# Patient Record
Sex: Female | Born: 1970 | Race: Black or African American | Hispanic: No | Marital: Married | Smoking: Never smoker
Health system: Southern US, Community
[De-identification: ages and names within clinical notes are randomized; demographics above are authoritative.]

## PROBLEM LIST (undated history)

## (undated) DIAGNOSIS — IMO0002 Reserved for concepts with insufficient information to code with codable children: Secondary | ICD-10-CM

## (undated) DIAGNOSIS — D649 Anemia, unspecified: Secondary | ICD-10-CM

## (undated) DIAGNOSIS — F419 Anxiety disorder, unspecified: Secondary | ICD-10-CM

## (undated) DIAGNOSIS — Z5189 Encounter for other specified aftercare: Secondary | ICD-10-CM

## (undated) DIAGNOSIS — I10 Essential (primary) hypertension: Secondary | ICD-10-CM

## (undated) DIAGNOSIS — K219 Gastro-esophageal reflux disease without esophagitis: Secondary | ICD-10-CM

## (undated) DIAGNOSIS — R51 Headache: Secondary | ICD-10-CM

## (undated) DIAGNOSIS — C801 Malignant (primary) neoplasm, unspecified: Secondary | ICD-10-CM

## (undated) HISTORY — PX: APPENDECTOMY: SHX54

---

## 1998-09-26 HISTORY — PX: BACK SURGERY: SHX140

## 2013-10-27 DIAGNOSIS — C801 Malignant (primary) neoplasm, unspecified: Secondary | ICD-10-CM

## 2013-10-27 HISTORY — DX: Malignant (primary) neoplasm, unspecified: C80.1

## 2013-12-31 ENCOUNTER — Ambulatory Visit (HOSPITAL_COMMUNITY): Payer: Self-pay

## 2014-01-07 ENCOUNTER — Encounter (HOSPITAL_COMMUNITY): Payer: Self-pay | Admitting: *Deleted

## 2014-01-07 ENCOUNTER — Encounter (HOSPITAL_COMMUNITY): Payer: Self-pay

## 2014-01-07 ENCOUNTER — Ambulatory Visit (HOSPITAL_COMMUNITY)
Admission: RE | Admit: 2014-01-07 | Discharge: 2014-01-07 | Disposition: A | Discharge status: Home / Self Care | Payer: Medicaid Other | Source: Ambulatory Visit | Attending: Obstetrics and Gynecology | Admitting: Obstetrics and Gynecology

## 2014-01-07 ENCOUNTER — Encounter (INDEPENDENT_AMBULATORY_CARE_PROVIDER_SITE_OTHER): Payer: Self-pay

## 2014-01-07 VITALS — BP 142/100 | Temp 98.1°F | Ht 70.0 in | Wt 183.2 lb

## 2014-01-07 DIAGNOSIS — N6310 Unspecified lump in the right breast, unspecified quadrant: Secondary | ICD-10-CM | POA: Insufficient documentation

## 2014-01-07 DIAGNOSIS — Z01419 Encounter for gynecological examination (general) (routine) without abnormal findings: Secondary | ICD-10-CM

## 2014-01-07 HISTORY — DX: Anemia, unspecified: D64.9

## 2014-01-07 HISTORY — DX: Essential (primary) hypertension: I10

## 2014-01-07 HISTORY — DX: Encounter for other specified aftercare: Z51.89

## 2014-01-07 NOTE — Patient Instructions (Addendum)
Taught [name] how to perform BSE and gave educational materials to take home. Let her know [facility] will cover Pap smears every 3 years unless has a history of abnormal Pap smears. Referred patient to the [facility] for right breast biopsy per recommendation. Unable to schedule appointment due to the Breast Center needs patient's films from [facility]. Recommended to patient to go over to the Breast Center to sign the consent to get her records released. Let her know that they can't schedule her breast biopsy until they receive her films.  Rhonda [name] verbalized understanding.  [name], RN 9:48 AM

## 2014-01-07 NOTE — Progress Notes (Signed)
Patient referred to [facility] from [facility] [place] due to a biopsy of the right breast being recommended. Patient complained that she has had a right breast lump x 1 year that has increased in size. Patient stated the lump is painful at times. Patient rated pain at a 5-6 out of 10.  Pap Smear:  Pap smear completed today. Patients last Pap smear was in [place] and negative. Patient thinks she had one last month but unsure. Per patient has no history of an abnormal Pap smear. No Pap smear results in EPIC.  Physical exam: Breasts Breasts symmetrical. No skin abnormalities left breast. Right breast has an orange peel appearance under nipple. No nipple retraction bilateral breasts. No nipple discharge bilateral breasts. No lymphadenopathy. No lumps palpated bilateral left breast. Palpated a large irregular mass within the right outer and center breast. Patient complained of some tenderness when palpated mass. Referred patient to the [facility] for right breast biopsy per recommendation. Unable to schedule appointment due to the Breast Center needs patient's films from [facility]          Pelvic/Bimanual   Ext Genitalia No lesions, no swelling and no discharge observed on external genitalia.         Vagina Vagina pink and normal texture. No lesions or discharge observed in vagina.          Cervix Cervix is present. Cervix pink and of normal texture. No discharge observed.     Uterus Uterus is present and palpable. Uterus in normal position and normal size.        Adnexae Bilateral ovaries present and palpable. No tenderness on palpation.          Rectovaginal No rectal exam completed today since patient had no rectal complaints. No skin abnormalities observed on exam.

## 2014-01-10 ENCOUNTER — Other Ambulatory Visit: Payer: Self-pay | Admitting: Obstetrics and Gynecology

## 2014-01-13 ENCOUNTER — Other Ambulatory Visit: Payer: Self-pay | Admitting: Obstetrics and Gynecology

## 2014-01-13 DIAGNOSIS — N63 Unspecified lump in unspecified breast: Secondary | ICD-10-CM

## 2014-01-20 ENCOUNTER — Ambulatory Visit
Admission: RE | Admit: 2014-01-20 | Discharge: 2014-01-20 | Disposition: A | Discharge status: Home / Self Care | Payer: No Typology Code available for payment source | Source: Ambulatory Visit | Attending: Obstetrics and Gynecology | Admitting: Obstetrics and Gynecology

## 2014-01-20 ENCOUNTER — Other Ambulatory Visit: Payer: Self-pay | Admitting: Obstetrics and Gynecology

## 2014-01-20 ENCOUNTER — Other Ambulatory Visit: Payer: Self-pay

## 2014-01-20 DIAGNOSIS — N63 Unspecified lump in unspecified breast: Secondary | ICD-10-CM

## 2014-01-21 ENCOUNTER — Other Ambulatory Visit (INDEPENDENT_AMBULATORY_CARE_PROVIDER_SITE_OTHER): Payer: Self-pay | Admitting: General Surgery

## 2014-01-21 DIAGNOSIS — C50911 Malignant neoplasm of unspecified site of right female breast: Secondary | ICD-10-CM

## 2014-01-22 ENCOUNTER — Telehealth (HOSPITAL_COMMUNITY): Payer: Self-pay | Admitting: *Deleted

## 2014-01-22 NOTE — Telephone Encounter (Signed)
Telephoned patient at home # and no answer. 

## 2014-01-23 ENCOUNTER — Telehealth: Payer: Self-pay | Admitting: *Deleted

## 2014-01-23 ENCOUNTER — Telehealth (HOSPITAL_COMMUNITY): Payer: Self-pay | Admitting: *Deleted

## 2014-01-23 DIAGNOSIS — C50411 Malignant neoplasm of upper-outer quadrant of right female breast: Secondary | ICD-10-CM

## 2014-01-23 NOTE — Telephone Encounter (Signed)
Confirmed [facility] for 01/29/14 at 12N .  Instructions and contact information given. 

## 2014-01-23 NOTE — Telephone Encounter (Signed)
Telephoned patient at home #. Discussed doing [facility] Medicaid paperwork. Patient will come on Wed. May 6 to fill out information. Will call if has any questions.

## 2014-01-27 ENCOUNTER — Ambulatory Visit (HOSPITAL_COMMUNITY)
Admission: RE | Admit: 2014-01-27 | Discharge: 2014-01-27 | Disposition: A | Discharge status: Home / Self Care | Payer: Medicaid Other | Source: Ambulatory Visit | Attending: General Surgery | Admitting: General Surgery

## 2014-01-27 DIAGNOSIS — C50911 Malignant neoplasm of unspecified site of right female breast: Secondary | ICD-10-CM

## 2014-01-27 DIAGNOSIS — C50919 Malignant neoplasm of unspecified site of unspecified female breast: Secondary | ICD-10-CM | POA: Diagnosis not present

## 2014-01-27 DIAGNOSIS — C773 Secondary and unspecified malignant neoplasm of axilla and upper limb lymph nodes: Secondary | ICD-10-CM | POA: Insufficient documentation

## 2014-01-27 LAB — POCT I-STAT, CHEM 8
BUN: 21 mg/dL (ref 6–23)
CHLORIDE: 104 meq/L (ref 96–112)
CREATININE: 0.7 mg/dL (ref 0.50–1.10)
Calcium, Ion: 1.22 mmol/L (ref 1.12–1.23)
GLUCOSE: 95 mg/dL (ref 70–99)
HCT: 43 % (ref 36.0–46.0)
HEMOGLOBIN: 14.6 g/dL (ref 12.0–15.0)
POTASSIUM: 3.8 meq/L (ref 3.7–5.3)
SODIUM: 144 meq/L (ref 137–147)
TCO2: 27 mmol/L (ref 0–100)

## 2014-01-27 MED ORDER — GADOBENATE DIMEGLUMINE 529 MG/ML IV SOLN
17.0000 mL | Freq: Once | INTRAVENOUS | Status: AC | PRN
  Administered 2014-01-27: 17 mL via INTRAVENOUS

## 2014-01-29 ENCOUNTER — Ambulatory Visit
Admission: RE | Admit: 2014-01-29 | Discharge: 2014-01-29 | Disposition: A | Discharge status: Home / Self Care | Payer: Medicaid Other | Source: Ambulatory Visit | Attending: Radiation Oncology | Admitting: Radiation Oncology

## 2014-01-29 ENCOUNTER — Ambulatory Visit: Payer: Medicaid Other

## 2014-01-29 ENCOUNTER — Ambulatory Visit (HOSPITAL_BASED_OUTPATIENT_CLINIC_OR_DEPARTMENT_OTHER): Payer: Medicaid Other | Admitting: General Surgery

## 2014-01-29 ENCOUNTER — Encounter: Payer: Self-pay | Admitting: Specialist

## 2014-01-29 ENCOUNTER — Ambulatory Visit (HOSPITAL_BASED_OUTPATIENT_CLINIC_OR_DEPARTMENT_OTHER): Payer: Medicaid Other | Admitting: Oncology

## 2014-01-29 ENCOUNTER — Telehealth: Payer: Self-pay | Admitting: Oncology

## 2014-01-29 ENCOUNTER — Ambulatory Visit: Payer: Medicaid Other | Attending: General Surgery | Admitting: Physical Therapy

## 2014-01-29 ENCOUNTER — Other Ambulatory Visit (HOSPITAL_BASED_OUTPATIENT_CLINIC_OR_DEPARTMENT_OTHER): Payer: Medicaid Other

## 2014-01-29 ENCOUNTER — Encounter: Payer: Self-pay | Admitting: Oncology

## 2014-01-29 VITALS — BP 135/81 | HR 69 | Temp 98.1°F | Resp 18 | Ht 70.0 in | Wt 186.1 lb

## 2014-01-29 DIAGNOSIS — Z9889 Other specified postprocedural states: Secondary | ICD-10-CM | POA: Insufficient documentation

## 2014-01-29 DIAGNOSIS — C50419 Malignant neoplasm of upper-outer quadrant of unspecified female breast: Secondary | ICD-10-CM

## 2014-01-29 DIAGNOSIS — C50919 Malignant neoplasm of unspecified site of unspecified female breast: Secondary | ICD-10-CM | POA: Diagnosis not present

## 2014-01-29 DIAGNOSIS — I10 Essential (primary) hypertension: Secondary | ICD-10-CM | POA: Insufficient documentation

## 2014-01-29 DIAGNOSIS — C773 Secondary and unspecified malignant neoplasm of axilla and upper limb lymph nodes: Secondary | ICD-10-CM

## 2014-01-29 DIAGNOSIS — C50411 Malignant neoplasm of upper-outer quadrant of right female breast: Secondary | ICD-10-CM

## 2014-01-29 DIAGNOSIS — Z170 Estrogen receptor positive status [ER+]: Secondary | ICD-10-CM

## 2014-01-29 DIAGNOSIS — IMO0001 Reserved for inherently not codable concepts without codable children: Secondary | ICD-10-CM | POA: Insufficient documentation

## 2014-01-29 LAB — CBC WITH DIFFERENTIAL/PLATELET
BASO%: 0.6 % (ref 0.0–2.0)
BASOS ABS: 0 10*3/uL (ref 0.0–0.1)
EOS ABS: 0 10*3/uL (ref 0.0–0.5)
EOS%: 0.8 % (ref 0.0–7.0)
HEMATOCRIT: 39 % (ref 34.8–46.6)
HEMOGLOBIN: 12.1 g/dL (ref 11.6–15.9)
LYMPH%: 44.9 % (ref 14.0–49.7)
MCH: 21.8 pg — AB (ref 25.1–34.0)
MCHC: 30.9 g/dL — ABNORMAL LOW (ref 31.5–36.0)
MCV: 70.7 fL — ABNORMAL LOW (ref 79.5–101.0)
MONO#: 0.6 10*3/uL (ref 0.1–0.9)
MONO%: 12.2 % (ref 0.0–14.0)
NEUT%: 41.5 % (ref 38.4–76.8)
NEUTROS ABS: 2.1 10*3/uL (ref 1.5–6.5)
Platelets: 206 10*3/uL (ref 145–400)
RBC: 5.52 10*6/uL — ABNORMAL HIGH (ref 3.70–5.45)
RDW: 15.7 % — AB (ref 11.2–14.5)
WBC: 5 10*3/uL (ref 3.9–10.3)
lymph#: 2.3 10*3/uL (ref 0.9–3.3)

## 2014-01-29 LAB — COMPREHENSIVE METABOLIC PANEL (CC13)
ALT: 10 U/L (ref 0–55)
ANION GAP: 6 meq/L (ref 3–11)
AST: 19 U/L (ref 5–34)
Albumin: 3.5 g/dL (ref 3.5–5.0)
Alkaline Phosphatase: 78 U/L (ref 40–150)
BUN: 16 mg/dL (ref 7.0–26.0)
CALCIUM: 9.9 mg/dL (ref 8.4–10.4)
CO2: 28 mEq/L (ref 22–29)
CREATININE: 0.8 mg/dL (ref 0.6–1.1)
Chloride: 111 mEq/L — ABNORMAL HIGH (ref 98–109)
GLUCOSE: 105 mg/dL (ref 70–140)
POTASSIUM: 4.8 meq/L (ref 3.5–5.1)
Sodium: 145 mEq/L (ref 136–145)
Total Bilirubin: 0.59 mg/dL (ref 0.20–1.20)
Total Protein: 8 g/dL (ref 6.4–8.3)
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
## 2014-01-29 NOTE — Progress Notes (Signed)
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 female who is seen today through the courtesy of Dr. Excell Seltzer at the [facility] for evaluation of her T3 N1 invasive ductal/DCIS of the right breast. She states that she first noted a right breast mass approximately 2 months ago. Mammography at the [facility] on 12/17/2013 showed a suspicious complex cystic mass within the right breast in addition to small suspicious lesions within the upper-outer quadrant. There was also linear microcalcifications suspicious for DCIS extending over 9 cm. Biopsy was recommended, and she visited the [facility]. On repeat imaging/ultrasound she was noted to have a cystic and solid mass at 9:00, 2 cm from the nipple measuring 2.3 x 1.5 x 2.3 cm. There were also masses at 8:30 and 10:00 measuring up 1.1 cm, 0.6 cm, and 1.0 cm. A lymph node was seen with a diffusely thickened cortex within the right axilla measuring 2.8 x 2.6 x 1.2 cm. Biopsies on 01/20/2014 of masses at 9:00 and 10:00 and axilla were diagnostic for invasive ductal carcinoma and also DCIS. Her tumor was ER/PR negative and HER-2/neu positive with a proliferation Ki-67 of 85%. Breast MR on 01/27/2014 showed extensive malignancy within the central, outer, and upper outer right breast measuring up to 9.4 cm. There was also associated right axillary, anterior pectoral and subpectoral lymphadenopathy. Left breast is unremarkable although she did have a circumscribed mass within the upper outer quadrant felt to represent a benign appearing lymph node measuring 0.5 x 0.6 x 0.6 cm. She is without complaints  today. She seen today with Dr. Excell Seltzer and Dr. Jana Hakim. She is without complaints today.  PREVIOUS RADIATION THERAPY: No   PAST MEDICAL HISTORY:  has a past medical history of Hypertension; Blood transfusion without reported diagnosis; and Anemia.     PAST SURGICAL HISTORY:  Past Surgical History  Procedure Laterality Date  . Cesarean section      2 previous  . Back surgery  2000  . Appendectomy       FAMILY HISTORY: family history includes Hypertension in her father and mother. No family history of breast cancer. Her mother and father are alive and well in their 68s.   SOCIAL HISTORY:  reports that she has never smoked. She has never used smokeless tobacco. She reports that she does not drink alcohol or use illicit drugs. Married, 3 children ages 29, 6, and 63. She is currently taking on line classes studying business/economics. She immigrated from the [place] approximately 10 years ago. Her husband is a Pharmacist, hospital.   ALLERGIES: Review of patient's allergies indicates no known allergies.   MEDICATIONS:  Current Outpatient Prescriptions  Medication Sig Dispense Refill  . acetaminophen (TYLENOL) 325 MG tablet Take 650 mg by mouth every 6 (six) hours as needed.      . baclofen (LIORESAL) 10 MG tablet Take 10 mg by mouth.      . ergocalciferol (VITAMIN D2) 50000 UNITS capsule Take 1 tablet by mouth.      . hydrochlorothiazide (MICROZIDE) 12.5 MG capsule Take 12.5 mg by mouth.      Marland Kitchen  ibuprofen (ADVIL,MOTRIN) 200 MG tablet Take 200 mg by mouth every 6 (six) hours as needed.      . Prenatal Vit-Fe Fumarate-FA (PRENATAL VITAMINS) 28-0.8 MG TABS Take 1 tablet by mouth.       No current facility-administered medications for this encounter.     REVIEW OF SYSTEMS:  Pertinent items are noted in HPI.    PHYSICAL EXAM: Alert and oriented 43 year old African American female appearing her stated age. Wt Readings from Last 3 Encounters:  01/29/14 186 lb 1.6 oz (84.414 kg)  01/07/14  183 lb 3.2 oz (83.099 kg)   Temp Readings from Last 3 Encounters:  01/29/14 98.1 F (36.7 C) Oral  01/07/14 98.1 F (36.7 C) Oral   BP Readings from Last 3 Encounters:  01/29/14 135/81  01/07/14 142/100   Pulse Readings from Last 3 Encounters:  01/29/14 69   Head and neck examination: Grossly unremarkable. Nodes: There is no palpable cervical or supraclavicular lymphadenopathy. I can feel an enlarged lymph node in the right axilla. Chest: Lungs clear. Breasts: There is a conglomeration of masses within the upper-outer quadrant of the right breast extending from the edge of the areola towards the axilla. These masses extending over 6-7 cm. There is no obvious skin involvement. Left breast without masses or lesions. Abdomen: Without hepatomegaly. Extremities: Without edema.    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 Dr. Jana Hakim will schedule completion of her staging workup. She will be referred for genetic counseling. We discussed local regional treatment options. She is a good candidate for neoadjuvant chemotherapy and we can expect a favorable response. I suspect that the 9 cm area of calcifications represent DCIS which would probably preclude breast preservation surgery. If she has an excellent response to neoadjuvant chemotherapy, and if she is highly motivated for breast preservation, we could biopsy the suspicious calcifications to confirm the presence or absence of DCIS following neoadjuvant chemotherapy. At this point in time I would certainly  lean towards modified radical mastectomy. She understands that she would probably need an axillary dissection based on her extensive axillary adenopathy. She will need postoperative radiation therapy to improve local regional control. We discussed the potential acute and late toxicities of radiation therapy. I can see her following her definitive surgery.   PLAN: As discussed above.  I spent 30 minutes minutes face to face with the patient and more than 50% of that time was spent in counseling and/or coordination of care.

## 2014-01-29 NOTE — Telephone Encounter (Signed)
per pof to sch pt appt/sent ECHO to be pre-cert-class sch-printed copy of sch

## 2014-01-29 NOTE — Progress Notes (Signed)
Chief Complaint: New diagnosis of breast cancer  History:    Rhonda [name] is a 43 y.o. premenopausal female referred by Dr. Darrin Nipper  for evaluation of recently diagnosed carcinoma of the right breast. The patient is originally from the [place]. She was in [place] about 2-3 months ago when she was able to feel a mass in the outer right breast as well as under her right arm. She was evaluated there but is a [place] citizen and wanted to return to the [place] for treatment. She subsequently returned to this country and saw her primary physician Dr. Marcell Anger and was referred to the breast center for further evaluation.  Subsequent imaging included diagnostic mamogram showing pleomorphic calcifications in the upper-outer right breast spanning an area of about 9 cm. 4 distinct well-circumscribed masses were seen in the upper outer and lower outer right breast measuring approximately 1.2 cm each and ultrasound showing multiple suspicious findings this included a 2 cm complex cystic mass in the superior central right breast, a 4 mm hypoechoic lesion in the upper-outer right breast 4 cm from the nipple, and 3 other suspicious solid masses all in the upper outer quadrant..   A ultrasound guided biopsy was performed on 01/21/2014 of 2 of the masses measuring 2.3 and 1.6 cm and a right axillary lymph node. 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 Subsequent bilateral breast MRI was performed which was negative in the left breast. On the right there were noted to be several enlarged axillary, interpectoral and subpectoral lymph nodes measuring up to 2.7 cm. There was extensive malignancy involving the central outer and upper right breast measuring up to 9.4 cm. She is seen now in multidisciplinary breast clinic for initial treatment planning.  She has experienced some mild breast fullness and discomfort. No nipple discharge..  She does have a  personal history of right breast abscess a number of years ago after birth of her first child.  Findings at that time were the following:  Tumor size: total of 9 cm  Tumor grade: 2  Estrogen Receptor: negative Progesterone Receptor: negative  Her-2 neu: positive  Lymph node status: positive   Past Medical History  Diagnosis Date  . Hypertension   . Blood transfusion without reported diagnosis     3 previous   . Anemia     Past Surgical History  Procedure Laterality Date  . Cesarean section      2 previous  . Back surgery  2000  . Appendectomy      Current Outpatient Prescriptions  Medication Sig Dispense Refill  . acetaminophen (TYLENOL) 325 MG tablet Take 650 mg by mouth every 6 (six) hours as needed.      . baclofen (LIORESAL) 10 MG tablet Take 10 mg by mouth.      . ergocalciferol (VITAMIN D2) 50000 UNITS capsule Take 1 tablet by mouth.      . hydrochlorothiazide (MICROZIDE) 12.5 MG capsule Take 12.5 mg by mouth.      Marland Kitchen ibuprofen (ADVIL,MOTRIN) 200 MG tablet Take 200 mg by mouth every 6 (six) hours as needed.      . Prenatal Vit-Fe Fumarate-FA (PRENATAL VITAMINS) 28-0.8 MG TABS Take 1 tablet by mouth.       No current facility-administered medications for this visit.    Family History  Problem Relation Age of Onset  . Hypertension Mother   . Hypertension Father     History   Social History  . Marital Status:  Married    Spouse Name: N/A    Number of Children: N/A  . Years of Education: N/A   Social History Main Topics  . Smoking status: Never Smoker   . Smokeless tobacco: Never Used  . Alcohol Use: No  . Drug Use: No  . Sexual Activity: Yes    Birth Control/ Protection: None   Other Topics Concern  . Not on file   Social History Narrative  . No narrative on file     Review of Systems A comprehensive review of systems was negative.     Objective:  LMP 11/02/2013  General: Alert, well-developed African female, in no distress Skin: Warm and  dry without rash or infection. HEENT: No palpable masses or thyromegaly. Sclera nonicteric. Pupils equal round and reactive. Oropharynx clear. Breasts: the right breast is mildly swollen and enlarged with mild edema around the nipple. There is a large firm irregular mass or multiple masses palpable in the upper outer and lateral breast. No fixation. There is palpable none fixed left axillary adenopathy. Right breast is negative to exam. Lymph nodes: No cervical, supraclavicular, or inguinal nodes palpable. Lungs: Breath sounds clear and equal without increased work of breathing Cardiovascular: Regular rate and rhythm without murmur. No JVD or edema. Peripheral pulses intact. Abdomen: Nondistended. Soft and nontender. No masses palpable. No organomegaly. No palpable hernias. Extremities: No edema or joint swelling or deformity. No chronic venous stasis changes. Neurologic: Alert and fully oriented. Gait normal.   Laboratory data:  CBC:  Lab Results  Component Value Date   WBC 5.0 01/29/2014   RBC 5.52* 01/29/2014   HGB 12.1 01/29/2014   HGB 14.6 01/27/2014   HCT 39.0 01/29/2014   HCT 43.0 01/27/2014   PLT 206 01/29/2014  ]  [facility] Labs:  Lab Results  Component Value Date   NA 145 01/29/2014   NA 144 01/27/2014   K 4.8 01/29/2014   K 3.8 01/27/2014   CL 104 01/27/2014   CO2 28 01/29/2014   BUN 16.0 01/29/2014   BUN 21 01/27/2014   CREATININE 0.8 01/29/2014   CREATININE 0.70 01/27/2014   CALCIUM 9.9 01/29/2014   PROT 8.0 01/29/2014   BILITOT 0.59 01/29/2014   ALKPHOS 78 01/29/2014   AST 19 01/29/2014   ALT 10 01/29/2014     Assessment  43 y.o. female with a new diagnosis of cancer of the the right breast Central, upper upper outer and lateral.  Clinical IIIA, estrogen receptor negative, progesterone receptor negative and Her2/Neu protein/oncogene positive. I discussed with the patient  initial surgical treatment options. She clearly would be best served with neoadjuvant chemotherapy to get her systemic treatment started  and possibly down stage for local disease. We discussed options of breast conservation with lumpectomy or total mastectomy and the ultimate need for axillary dissection. I think it is likely she will require mastectomy particularly with the degree of calcifications representing DCIS but we discussed that for now we did not have to make this decision and there is the possibility that she could be down staged to the point of being a candidate for breast conservation.Options for reconstruction were discussed. We discussed Port-A-Cath placement including its indication in nature the surgery as well as risks of anesthetic complications, bleeding, infection, pneumothorax and subsequent risks of catheter malfunction or displacement or occlusion and DVT. She will need genetic testing. Metastatic workup is planned.we will plan to proceed with Port-A-Cath placement and subsequent followup during her neoadjuvant treatment. Plan Port-A-Cath placement as an  outpatient to begin neoadjuvant treatment. Metastatic workup and genetic counseling.  Edward Jolly MD, FACS  01/29/2014, 3:05 PM

## 2014-01-29 NOTE — Progress Notes (Signed)
[place]  [phone] [phone]     ID: Rhonda [name] OB: [birth year]    MR#: [id number]  EHM#:[id number]  PCP: Tereasa Coop, PA-C GYN:   SU: Excell Seltzer OTHER MD: Arloa Koh  CHIEF COMPLAINT: "I have breast cancer".  BREAST CANCER HISTORY: Rhonda [name] was visiting relatives in [place] about 4 months ago when she noted pain in her right breast. In 2003 she had a right breast infection which felt pretty much the same. She was treated with antibiotics and then (while living in [place]). Accordingly this time she minimized the symptoms. It felt like milk was coming down she says. She also had breast pain while having her period. Then in February she actually found 2 lumps in her right breast breast which she initially ignored. Eventually as the symptoms seemed to progress she saw a Dr. [place] and he told her she likely had breast cancer. She [place] until [place] to seek definitive care.  On 12/17/2013 she had bilateral diagnostic mammography in [facility]). In the upper outer right breast there were pleomorphic calcifications measuring approximately 9 cm in diameter. There were 4 distinct microlobulated masses in the right breast, 3 of them in the upper outer quadrant one in the lower outer quadrant. Each measures approximately 1.2 cm. Ultrasound showed a complex cystic mass in the superior central right breast, a 4 mm hypoechoic lesion, 3 suspicious solid masses elsewhere in the right breast, all of them hypoechoic and oval are round with increased vascularity. The largest of these masses measure 1.7 cm. Findings in the left breast were not suspicious or specific. The patient was referred to the breast Center where on 01/20/2014 she underwent biopsy of 2 of the right breast masses, and a right axillary lymph node. 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  On may 01/13/2014 the patient underwent bilateral breast MRI. This showed, in the right breast, multiple irregular enhancing masses in the setting of clumped non-masslike enhancement the area in question measured up to 9.4 cm. There was diffuse asymmetric skin thickening and edema involving the right breast, but no obvious nipple or pectoralis involvement. There were several enlarged and morphologically abnormal right axillary lymph node as well as inter-pectoral and subpectoral adenopathy there the largest right axillary lymph node measured 2.7 cm. There was no internal mammary lymphadenopathy noted. The left breast was unremarkable.  Her subsequent history is as detailed below.  INTERVAL HISTORY: Rhonda [name] was evaluated in the multidisciplinary breast cancer clinic 01/29/2014. Her case was presented at the multidisciplinary breast cancer conference that same morning.  REVIEW OF SYSTEMS: Aside from the right breast symptoms noted, there were no systemic symptoms leading to the diagnostic mammogram,. The patient denies unusual headaches, visual changes, nausea, vomiting, stiff neck, dizziness, or gait imbalance. There has been no cough, phlegm production, or pleurisy, no chest pain or pressure, and no change in bowel or bladder habits. The patient denies fever, rash, bleeding, unexplained fatigue or unexplained weight loss. She tells me her appetite has been a little down, and sometimes she has pain with urination. She has rare headaches, which are not more frequent or intense than prior. A detailed review of systems was otherwise entirely negative.  PAST MEDICAL HISTORY: Past Medical History  Diagnosis Date  . Hypertension   . Blood transfusion without reported diagnosis  3 previous   . Anemia     PAST SURGICAL HISTORY: Past Surgical History  Procedure Laterality Date  . Cesarean section      2  previous  . Back surgery  2000  . Appendectomy      FAMILY HISTORY Family History  Problem Relation Age of Onset  . Hypertension Mother   . Hypertension Father    the patient's parents are living but she is not sure of their age. She had 4 brothers and 4 sisters. 2 other brothers died in the turmoil of [place], and one brother is currently in [place] but they do not know where are even if he is still alive. She has one brother in this area. There is no history of breast or ovarian cancer in the family to her knowledge.  GYNECOLOGIC HISTORY:  Menarche somewhere between the ages of 89 and 72. First live birth age 68. The patient is GX P3. Her periods are now irregular. She took birth control pills remotely for approximately one year with no complaints.  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 Her husband Rhonda [name] Rhonda [name] is a former and basilar to the [place] from some and is a Optometrist. Their children are Duopl (11),Nyamal (8) and Nyewech (4). The patient attends a [employer] locally    ADVANCED DIRECTIVES: Not in place   HEALTH MAINTENANCE: History  Substance Use Topics  . Smoking status: Never Smoker   . Smokeless tobacco: Never Used  . Alcohol Use: No     Colonoscopy:  PAP: April 2015  Bone density:  Lipid panel:  No Known Allergies  Current Outpatient Prescriptions  Medication Sig Dispense Refill  . acetaminophen (TYLENOL) 325 MG tablet Take 650 mg by mouth every 6 (six) hours as needed.      . baclofen (LIORESAL) 10 MG tablet Take 10 mg by mouth.      . ergocalciferol (VITAMIN D2) 50000 UNITS capsule Take 1 tablet by mouth.      . hydrochlorothiazide (MICROZIDE) 12.5 MG capsule Take 12.5 mg by mouth.      Marland Kitchen ibuprofen (ADVIL,MOTRIN) 200 MG tablet Take 200 mg by mouth every 6 (six) hours as needed.      . Prenatal Vit-Fe Fumarate-FA (PRENATAL VITAMINS) 28-0.8 MG TABS Take 1 tablet by mouth.       No current facility-administered medications  for this visit.    OBJECTIVE: African woman who appears well Filed Vitals:   01/29/14 1241  BP: 135/81  Pulse: 69  Temp: 98.1 F (36.7 C)  Resp: 18     Body mass index is 26.7 kg/(m^2).    ECOG FS:1 - Symptomatic but completely ambulatory  Ocular: Sclerae unicteric, pupils equal, round and reactive to light Ear-nose-throat: Oropharynx clear and moist Lymphatic: No cervical or supraclavicular adenopathy Lungs no rales or rhonchi, good excursion bilaterally Heart regular rate and rhythm, no murmur appreciated Abd soft, nontender, positive bowel sounds MSK no focal spinal tenderness, no joint edema Neuro: non-focal, well-oriented, appropriate affect Breasts: The right breast shows what appears to be a 5 cm mass in the upper outer quadrant. This is movable. There is no obvious erythema or evidence of inflammatory breast cancer. The right axilla shows palpable adenopathy. It is not fixed. The left breast is unremarkable.   LAB RESULTS:  CMP     Component Value Date/Time   NA 145 01/29/2014 1231   NA 144 01/27/2014 1630   K 4.8 01/29/2014 1231  K 3.8 01/27/2014 1630   CL 104 01/27/2014 1630   CO2 28 01/29/2014 1231   GLUCOSE 105 01/29/2014 1231   GLUCOSE 95 01/27/2014 1630   BUN 16.0 01/29/2014 1231   BUN 21 01/27/2014 1630   CREATININE 0.8 01/29/2014 1231   CREATININE 0.70 01/27/2014 1630   CALCIUM 9.9 01/29/2014 1231   PROT 8.0 01/29/2014 1231   ALBUMIN 3.5 01/29/2014 1231   AST 19 01/29/2014 1231   ALT 10 01/29/2014 1231   ALKPHOS 78 01/29/2014 1231   BILITOT 0.59 01/29/2014 1231    I No results found for this basename: SPEP, UPEP,  kappa and lambda light chains    Lab Results  Component Value Date   WBC 5.0 01/29/2014   NEUTROABS 2.1 01/29/2014   HGB 12.1 01/29/2014   HCT 39.0 01/29/2014   MCV 70.7* 01/29/2014   PLT 206 01/29/2014      Chemistry      Component Value Date/Time   NA 145 01/29/2014 1231   NA 144 01/27/2014 1630   K 4.8 01/29/2014 1231   K 3.8 01/27/2014 1630   CL 104 01/27/2014 1630   CO2 28  01/29/2014 1231   BUN 16.0 01/29/2014 1231   BUN 21 01/27/2014 1630   CREATININE 0.8 01/29/2014 1231   CREATININE 0.70 01/27/2014 1630      Component Value Date/Time   CALCIUM 9.9 01/29/2014 1231   ALKPHOS 78 01/29/2014 1231   AST 19 01/29/2014 1231   ALT 10 01/29/2014 1231   BILITOT 0.59 01/29/2014 1231       No results found for this basename: LABCA2    No components found with this basename: LABCA125    No results found for this basename: INR,  in the last 168 hours  Urinalysis No results found for this basename: colorurine, appearanceur, labspec, phurine, glucoseu, hgbur, bilirubinur, ketonesur, proteinur, urobilinogen, nitrite, leukocytesur    STUDIES: Mr Breast Bilateral W Wo Contrast  01/28/2014   CLINICAL DATA:  43 year old female with recently diagnosed 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  LABS:  Most recent serum creatinine 0.7 milligrams/deciliter.  EXAM: BILATERAL BREAST MRI WITH AND WITHOUT CONTRAST  TECHNIQUE: Multiplanar, multisequence MR images of both breasts were obtained prior to and following the intravenous administration of 51m of MultiHance.  THREE-DIMENSIONAL MR IMAGE RENDERING ON INDEPENDENT WORKSTATION:  Three-dimensional MR images were rendered by post-processing of the original MR data on an independent workstation. The three-dimensional MR images were interpreted, and findings are reported in the following complete MRI report for this study. Three dimensional images were evaluated at the independent DynaCad workstation  COMPARISON:  Previous exams  FINDINGS: Breast composition: c:  Heterogeneous fibroglandular tissue  Background parenchymal enhancement: Moderate  Right breast: Multiple irregular enhancing masses with associated contiguous and surrounding areas of clumped non mass enhancement are seen throughout the central, outer, and upper-outer right breast compatible  with known biopsy proven malignancy. This measures up to 9.4 cm in greatest AP dimension, approximately 5 cm transverse, and up to 7 cm craniocaudal. There is diffuse asymmetric skin thickening as well as edema involving the right breast. No obvious nipple involvement. No pectoralis involvement.  Left breast: No suspicious rapidly enhancing masses or abnormal areas of enhancement in the left breast to suggest malignancy. A small oval circumscribed mass in the upper-outer left breast measuring 0.5 x 0.6 x 0.6 cm corresponds with a benign appearing  lymph node seen on the recent outside ultrasound.  Lymph nodes: Several enlarged and morphologically abnormal right axillary, interpectoral, and subpectoral lymph nodes are identified compatible with known biopsy proven axillary metastases, with a representative enlarged right axillary lymph node measuring up to 2.7 cm AP. No internal mammary lymphadenopathy seen.  Ancillary findings:  None.  IMPRESSION: Extensive malignancy involving predominately the central, outer, and upper-outer right breast measuring up to 9.4 cm in greatest AP dimension with associated right axillary (biopsy proven), interpectoral, and subpectoral lymphadenopathy.  RECOMMENDATION: Treatment for known biopsy proven malignancy in the right breast.  BI-RADS CATEGORY  6: Known biopsy-proven malignancy.   Electronically Signed   By: Everlean Alstrom M.D.   On: 01/28/2014 09:25   Mm Digital Diagnostic Unilat R  01/20/2014   CLINICAL DATA:  Right breast masses. Ultrasound guided right breast biopsies.  EXAM: POST-BIOPSY CLIP PLACEMENT RIGHT DIAGNOSTIC MAMMOGRAM  COMPARISON:  Previous exams.  FINDINGS: Films are performed following ultrasound guided biopsy of right breast masses. Images show a ribbon type clip in association with the mass in the 9 o'clock position, 2 cm the nipple and a wing type clip in association with a mass in the 10 o'clock position, 7 cm the nipple. The clips are 5.8 cm apart.   IMPRESSION: Adequate clip placement following ultrasound-guided right breast biopsies.  Final Assessment: Post Procedure Mammograms for Marker Placement   Electronically Signed   By: Duke Salvia M.D.   On: 01/20/2014 16:17   [place] Rt Breast Bx W Loc Dev 1st Lesion Img Bx Spec US Guide  01/21/2014   ADDENDUM REPORT: 01/21/2014 13:37  ADDENDUM: Biopsy results reveal grade 2 ductal carcinoma in situ and invasive ductal carcinoma at site 1, grade 2 invasive ductal carcinoma at site to and metastases in the lymph node in the right axilla. This is concordant with imaging findings. The lower inner and lower outer quadrant of the breast are involved spanning at least 10 cm. This discussed with the patient by telephone at 115 pm and also texted to the patient's cell phone. An MRI is scheduled for 01/27/2014 at 4 p.m. at [facility]. She will be seen at [facility] on 01/29/2014. She states the site is healing well with mild tenderness which is controlled with Tylenol. No significant bruising is present.   Electronically Signed   By: Duke Salvia M.D.   On: 01/21/2014 13:37   01/21/2014   CLINICAL DATA:  Right breast masses, right axillary mass  EXAM: ULTRASOUND GUIDED RIGHT BREAST CORE NEEDLE BIOPSY X 2 AND RIGHT AXILLARY CORE NEEDLE BIOPSY  COMPARISON:  Previous exams.  FINDINGS: A cystic and solid mass is imaged in the 9 o'clock position, 2 cm from the nipple measuring 2.3 x 1.5 x 2.3 cm (SITE 1). A round homogeneously hypoechoic mass with irregular margins is imaged and 8:30 position, 3 cm from nipple measuring 1.0 x 0.8 x 0.9 cm. Three similar appearing masses are identified in the 10 o'clock position, at 4 cm the nipple, 6 cm the nipple and 7 cm from nipple measuring 1.0 x 1.0 x 1.1 cm, 1.0 x 1.6 x 1.1 cm and 0.9 x 1.0 x 0.9 cm, respectively. The area that is biopsied is in the 10 o'clock position, 7 cm from nipple, (SITE 2). A lymph node with diffusely thickened cortex is imaged in the  right axilla measuring 2.8 x 2.6 x 1.2 cm (site 3).  I met with the patient and we discussed the procedure of ultrasound-guided biopsy,  including benefits and alternatives. We discussed the high likelihood of a successful procedure. We discussed the risks of the procedure, including infection, bleeding, tissue injury, clip migration, and inadequate sampling. Informed written consent was given. The usual time-out protocol was performed immediately prior to the procedure.  Using sterile technique and 2% Lidocaine as local anesthetic, under direct ultrasound visualization, approximately 5 cc of blood-tinged fluid is aspirated from the mass in the 9 o'clock position. The fluid is not purulent and therefore, is not sent for cytology. Then, a 14 gauge spring-loaded device was used to perform biopsy of the mass in the 9 o'clock position using a lateral approach. This is SITE 1. At the conclusion of the procedure a ribbon type tissue marker clip was deployed into the biopsy cavity.  Using sterile technique and 2% Lidocaine as local anesthetic, under direct ultrasound visualization, a 14 gauge spring-loaded device was used to perform biopsy of the mass in the 10 o'clock position, 7 cm from the nipple using an inferior approach. This is SITE 2. At the conclusion of the procedure a wing type tissue marker clip was deployed into the biopsy cavity.  Using sterile technique and 2% Lidocaine as local anesthetic, under direct ultrasound visualization, a 14 gauge spring-loaded device was used to perform biopsy of the right axillary lymph node using a lateral approach. No tissue marker is placed in the axilla.  Follow up 2 view mammogram was performed and dictated separately.  IMPRESSION: Ultrasound guided biopsy of 2 right breast masses and a right axillary lymph node. No apparent complications.  Electronically Signed: By: Duke Salvia M.D. On: 01/20/2014 16:15   [place] Rt Breast Bx W Loc Dev Ea Add Lesion Img Bx Spec US  Guide  01/21/2014   ADDENDUM REPORT: 01/21/2014 13:37  ADDENDUM: Biopsy results reveal grade 2 ductal carcinoma in situ and invasive ductal carcinoma at site 1, grade 2 invasive ductal carcinoma at site to and metastases in the lymph node in the right axilla. This is concordant with imaging findings. The lower inner and lower outer quadrant of the breast are involved spanning at least 10 cm. This discussed with the patient by telephone at 115 pm and also texted to the patient's cell phone. An MRI is scheduled for 01/27/2014 at 4 p.m. at [facility]. She will be seen at [facility] on 01/29/2014. She states the site is healing well with mild tenderness which is controlled with Tylenol. No significant bruising is present.   Electronically Signed   By: Duke Salvia M.D.   On: 01/21/2014 13:37   01/21/2014   CLINICAL DATA:  Right breast masses, right axillary mass  EXAM: ULTRASOUND GUIDED RIGHT BREAST CORE NEEDLE BIOPSY X 2 AND RIGHT AXILLARY CORE NEEDLE BIOPSY  COMPARISON:  Previous exams.  FINDINGS: A cystic and solid mass is imaged in the 9 o'clock position, 2 cm from the nipple measuring 2.3 x 1.5 x 2.3 cm (SITE 1). A round homogeneously hypoechoic mass with irregular margins is imaged and 8:30 position, 3 cm from nipple measuring 1.0 x 0.8 x 0.9 cm. Three similar appearing masses are identified in the 10 o'clock position, at 4 cm the nipple, 6 cm the nipple and 7 cm from nipple measuring 1.0 x 1.0 x 1.1 cm, 1.0 x 1.6 x 1.1 cm and 0.9 x 1.0 x 0.9 cm, respectively. The area that is biopsied is in the 10 o'clock position, 7 cm from nipple, (SITE 2). A lymph node with diffusely thickened cortex is imaged  in the right axilla measuring 2.8 x 2.6 x 1.2 cm (site 3).  I met with the patient and we discussed the procedure of ultrasound-guided biopsy, including benefits and alternatives. We discussed the high likelihood of a successful procedure. We discussed the risks of the procedure, including  infection, bleeding, tissue injury, clip migration, and inadequate sampling. Informed written consent was given. The usual time-out protocol was performed immediately prior to the procedure.  Using sterile technique and 2% Lidocaine as local anesthetic, under direct ultrasound visualization, approximately 5 cc of blood-tinged fluid is aspirated from the mass in the 9 o'clock position. The fluid is not purulent and therefore, is not sent for cytology. Then, a 14 gauge spring-loaded device was used to perform biopsy of the mass in the 9 o'clock position using a lateral approach. This is SITE 1. At the conclusion of the procedure a ribbon type tissue marker clip was deployed into the biopsy cavity.  Using sterile technique and 2% Lidocaine as local anesthetic, under direct ultrasound visualization, a 14 gauge spring-loaded device was used to perform biopsy of the mass in the 10 o'clock position, 7 cm from the nipple using an inferior approach. This is SITE 2. At the conclusion of the procedure a wing type tissue marker clip was deployed into the biopsy cavity.  Using sterile technique and 2% Lidocaine as local anesthetic, under direct ultrasound visualization, a 14 gauge spring-loaded device was used to perform biopsy of the right axillary lymph node using a lateral approach. No tissue marker is placed in the axilla.  Follow up 2 view mammogram was performed and dictated separately.  IMPRESSION: Ultrasound guided biopsy of 2 right breast masses and a right axillary lymph node. No apparent complications.  Electronically Signed: By: Duke Salvia M.D. On: 01/20/2014 16:15   [place] Rt Breast Bx W Loc Dev Ea Add Lesion Img Bx Spec US Guide  01/21/2014   ADDENDUM REPORT: 01/21/2014 13:37  ADDENDUM: Biopsy results reveal grade 2 ductal carcinoma in situ and invasive ductal carcinoma at site 1, grade 2 invasive ductal carcinoma at site to and metastases in the lymph node in the right axilla. This is concordant with imaging  findings. The lower inner and lower outer quadrant of the breast are involved spanning at least 10 cm. This discussed with the patient by telephone at 115 pm and also texted to the patient's cell phone. An MRI is scheduled for 01/27/2014 at 4 p.m. at [facility]. She will be seen at [facility] on 01/29/2014. She states the site is healing well with mild tenderness which is controlled with Tylenol. No significant bruising is present.   Electronically Signed   By: Duke Salvia M.D.   On: 01/21/2014 13:37   01/21/2014   CLINICAL DATA:  Right breast masses, right axillary mass  EXAM: ULTRASOUND GUIDED RIGHT BREAST CORE NEEDLE BIOPSY X 2 AND RIGHT AXILLARY CORE NEEDLE BIOPSY  COMPARISON:  Previous exams.  FINDINGS: A cystic and solid mass is imaged in the 9 o'clock position, 2 cm from the nipple measuring 2.3 x 1.5 x 2.3 cm (SITE 1). A round homogeneously hypoechoic mass with irregular margins is imaged and 8:30 position, 3 cm from nipple measuring 1.0 x 0.8 x 0.9 cm. Three similar appearing masses are identified in the 10 o'clock position, at 4 cm the nipple, 6 cm the nipple and 7 cm from nipple measuring 1.0 x 1.0 x 1.1 cm, 1.0 x 1.6 x 1.1 cm and 0.9 x 1.0 x 0.9  cm, respectively. The area that is biopsied is in the 10 o'clock position, 7 cm from nipple, (SITE 2). A lymph node with diffusely thickened cortex is imaged in the right axilla measuring 2.8 x 2.6 x 1.2 cm (site 3).  I met with the patient and we discussed the procedure of ultrasound-guided biopsy, including benefits and alternatives. We discussed the high likelihood of a successful procedure. We discussed the risks of the procedure, including infection, bleeding, tissue injury, clip migration, and inadequate sampling. Informed written consent was given. The usual time-out protocol was performed immediately prior to the procedure.  Using sterile technique and 2% Lidocaine as local anesthetic, under direct ultrasound visualization,  approximately 5 cc of blood-tinged fluid is aspirated from the mass in the 9 o'clock position. The fluid is not purulent and therefore, is not sent for cytology. Then, a 14 gauge spring-loaded device was used to perform biopsy of the mass in the 9 o'clock position using a lateral approach. This is SITE 1. At the conclusion of the procedure a ribbon type tissue marker clip was deployed into the biopsy cavity.  Using sterile technique and 2% Lidocaine as local anesthetic, under direct ultrasound visualization, a 14 gauge spring-loaded device was used to perform biopsy of the mass in the 10 o'clock position, 7 cm from the nipple using an inferior approach. This is SITE 2. At the conclusion of the procedure a wing type tissue marker clip was deployed into the biopsy cavity.  Using sterile technique and 2% Lidocaine as local anesthetic, under direct ultrasound visualization, a 14 gauge spring-loaded device was used to perform biopsy of the right axillary lymph node using a lateral approach. No tissue marker is placed in the axilla.  Follow up 2 view mammogram was performed and dictated separately.  IMPRESSION: Ultrasound guided biopsy of 2 right breast masses and a right axillary lymph node. No apparent complications.  Electronically Signed: By: Duke Salvia M.D. On: 01/20/2014 16:15    ASSESSMENT: 43 y.o. [place] woman originally from [place] status post right breast and right axillary lymph node biopsy 01/20/2014, both positive for a clinically T3 N1-2, stage III a invasive ductal carcinoma, grade 2, estrogen and progesterone receptor positive, HER-2 amplified, with an MIB-1 of 85%  PLAN: We spent the better part of today's hour-long appointment discussing the biology of breast cancer in general, and the specifics of the patient's tumor in particular. Siddalee understands she has a fast growing, stage III breast cancer which "does not eat estrogen". This means in terms of systemic treatment  anti-estrogens would not be useful. Her options for systemic treatment our chemotherapy and anti-HER-2 immunotherapy.  Her case was discussed this morning at the multidisciplinary breast cancer conference, and the consensus was that [place] will benefit from neoadjuvant chemotherapy in conjunction with anti-HER-2 treatment. She can then proceed to surgery, which will be a modified radical mastectomy on the right as currently planned. She would need radiation and postmastectomy.  We did discuss the chemotherapy options and she will receive doxorubicin and cyclophosphamide and dose dense fashion x4, with Neulasta support, followed by paclitaxel weekly given with pertuzumab and trastuzumab. That will be continued for 12 weeks. The total chemotherapy accordingly we'll take 20 weeks. Of course the anti-HER-2 treatment will be continued to the total one year.  We discussed some of the possible toxicities, side effects and complications of treatment and she will come to "chemotherapy school" to discuss that further. In particular, the patient would be interested in having  more children, but understands she may become permanently menopausal as a result of chemotherapy.   She will have a port placed, and echocardiogram, and she will have staging studies to document that she does not currently have stage IV disease. She will return to see me 02/05/2014 and we will try to get her chemotherapy started 02/11/2014. At the next visit we will discuss her possible participation in the [place] has a good understanding of the overall plan. She agrees with it. She knows the goal of treatment in her case is cure. She will call with any problems that may develop before her next visit here   Chauncey Cruel, MD   01/29/2014 2:11 PM

## 2014-01-30 ENCOUNTER — Other Ambulatory Visit: Payer: Medicaid Other

## 2014-01-30 ENCOUNTER — Encounter: Payer: Self-pay | Admitting: *Deleted

## 2014-02-01 ENCOUNTER — Telehealth: Payer: Self-pay | Admitting: Oncology

## 2014-02-01 NOTE — Telephone Encounter (Signed)
Sent a staff message to michelle to add the pt in for her chemo appts gve copy of the echo referral to [facility] for precert. Will call the pt.

## 2014-02-03 ENCOUNTER — Telehealth: Payer: Self-pay | Admitting: Oncology

## 2014-02-03 ENCOUNTER — Telehealth: Payer: Self-pay | Admitting: *Deleted

## 2014-02-03 NOTE — Telephone Encounter (Signed)
appt w/AB per 5/6 pof not scheduled due to pt already on schedule for f/u w/AB 5/13 and 5/12 @ 3:45pm in not an active appt slot. s.w navaigator (dawn) and per navigator pt aware of 5/13 appt.

## 2014-02-03 NOTE — Telephone Encounter (Signed)
Per staff message and POF I have scheduled appts.  JMW  

## 2014-02-04 ENCOUNTER — Other Ambulatory Visit: Payer: Self-pay

## 2014-02-04 ENCOUNTER — Encounter (HOSPITAL_COMMUNITY)
Admission: RE | Admit: 2014-02-04 | Discharge: 2014-02-04 | Disposition: A | Discharge status: Home / Self Care | Payer: Medicaid Other | Source: Ambulatory Visit | Attending: General Surgery | Admitting: General Surgery

## 2014-02-04 ENCOUNTER — Telehealth: Payer: Self-pay | Admitting: Oncology

## 2014-02-04 ENCOUNTER — Encounter (HOSPITAL_COMMUNITY): Payer: Self-pay

## 2014-02-04 ENCOUNTER — Other Ambulatory Visit: Payer: Self-pay | Admitting: Physician Assistant

## 2014-02-04 ENCOUNTER — Encounter (HOSPITAL_COMMUNITY): Payer: Self-pay | Admitting: Pharmacy Technician

## 2014-02-04 ENCOUNTER — Ambulatory Visit (HOSPITAL_COMMUNITY)
Admission: RE | Admit: 2014-02-04 | Discharge: 2014-02-04 | Disposition: A | Discharge status: Home / Self Care | Payer: Medicaid Other | Source: Ambulatory Visit | Attending: Anesthesiology | Admitting: Anesthesiology

## 2014-02-04 ENCOUNTER — Other Ambulatory Visit: Payer: Medicaid Other

## 2014-02-04 DIAGNOSIS — C50919 Malignant neoplasm of unspecified site of unspecified female breast: Secondary | ICD-10-CM | POA: Insufficient documentation

## 2014-02-04 HISTORY — DX: Reserved for concepts with insufficient information to code with codable children: IMO0002

## 2014-02-04 HISTORY — DX: Headache: R51

## 2014-02-04 HISTORY — DX: Malignant (primary) neoplasm, unspecified: C80.1

## 2014-02-04 HISTORY — DX: Gastro-esophageal reflux disease without esophagitis: K21.9

## 2014-02-04 LAB — HCG, SERUM, QUALITATIVE: PREG SERUM: NEGATIVE

## 2014-02-04 NOTE — Telephone Encounter (Signed)
cld pt to make aware of the Echo that was sch in am.Reminded pt of the appt w/Berry in the am. Adv pt that i would print an updated sch for her after the appt w/AB

## 2014-02-04 NOTE — Progress Notes (Signed)
CMET 01/29/14 on EPIC, CBC with diff 01/29/14 on EPIC

## 2014-02-04 NOTE — Patient Instructions (Addendum)
[place]  02/04/2014   Your procedure is scheduled on: 02/06/14  Report to [place] at 7:30 AM.  Call this number if you have problems the morning of surgery [phone]: [phone]   Remember:   Do not eat food or drink liquids After Midnight.     Take these medicines the morning of surgery with A SIP OF WATER: tylenol if needed   Do not wear jewelry, make-up or nail polish.  Do not wear lotions, powders, or perfumes. You may wear deodorant.  Do not shave 48 hours prior to surgery. Men may shave face and neck.  Do not bring valuables to the hospital.  Contacts, dentures or bridgework may not be worn into surgery.     Patients discharged the day of surgery will not be allowed to drive home.  Name and phone number of your driver: Marita Snellen [phone]  Paulette Blanch, RN  pre op nurse call if needed [phone]    [facility] - Preparing for Surgery Before surgery, you can play an important role.  Because skin is not sterile, your skin needs to be as free of germs as possible.  You can reduce the number of germs on your skin by washing with CHG (chlorahexidine gluconate) soap before surgery.  CHG is an antiseptic cleaner which kills germs and bonds with the skin to continue killing germs even after washing. Please DO NOT use if you have an allergy to CHG or antibacterial soaps.  If your skin becomes reddened/irritated stop using the CHG and inform your nurse when you arrive at Short Stay. Do not shave (including legs and underarms) for at least 48 hours prior to the first CHG shower.  You may shave your face. Please follow these instructions carefully:  1.  Shower with CHG Soap the night before surgery and the  morning of Surgery.  2.  If you choose to wash your hair, wash your hair first as usual with your  normal  shampoo.  3.  After you shampoo, rinse your hair and body thoroughly to remove the  shampoo.                            4.  Use CHG as you would any  other liquid soap.  You can apply chg directly  to the skin and wash                       Gently with a scrungie or clean washcloth.  5.  Apply the CHG Soap to your body ONLY FROM THE NECK DOWN.   Do not use on open                           Wound or open sores. Avoid contact with eyes, ears mouth and genitals (private parts).                        Genitals (private parts) with your normal soap.             6.  Wash thoroughly, paying special attention to the area where your surgery  will be performed.  7.  Thoroughly rinse your body with warm water from the neck down.  8.  DO NOT shower/wash with your normal soap after using and rinsing off  the CHG Soap.  9.  Pat yourself dry with a clean towel.            10.  Wear clean pajamas.            11.  Place clean sheets on your bed the night of your first shower and do not  sleep with pets. Day of Surgery : Do not apply any lotions/deodorants the morning of surgery.  Please wear clean clothes to the hospital/surgery center.  FAILURE TO FOLLOW THESE INSTRUCTIONS MAY RESULT IN THE CANCELLATION OF YOUR SURGERY PATIENT SIGNATURE_________________________________  NURSE SIGNATURE__________________________________  ________________________________________________________________________

## 2014-02-05 ENCOUNTER — Encounter: Payer: Self-pay | Admitting: Physician Assistant

## 2014-02-05 ENCOUNTER — Ambulatory Visit (HOSPITAL_BASED_OUTPATIENT_CLINIC_OR_DEPARTMENT_OTHER): Payer: Medicaid Other

## 2014-02-05 ENCOUNTER — Ambulatory Visit (HOSPITAL_BASED_OUTPATIENT_CLINIC_OR_DEPARTMENT_OTHER): Payer: Medicaid Other | Admitting: Physician Assistant

## 2014-02-05 ENCOUNTER — Telehealth: Payer: Self-pay | Admitting: Physician Assistant

## 2014-02-05 ENCOUNTER — Ambulatory Visit (HOSPITAL_COMMUNITY)
Admission: RE | Admit: 2014-02-05 | Discharge: 2014-02-05 | Disposition: A | Discharge status: Home / Self Care | Payer: Medicaid Other | Source: Ambulatory Visit | Attending: Oncology | Admitting: Oncology

## 2014-02-05 VITALS — BP 137/87 | HR 59 | Temp 98.5°F | Resp 18 | Ht 70.0 in | Wt 187.5 lb

## 2014-02-05 DIAGNOSIS — Z170 Estrogen receptor positive status [ER+]: Secondary | ICD-10-CM

## 2014-02-05 DIAGNOSIS — C50419 Malignant neoplasm of upper-outer quadrant of unspecified female breast: Secondary | ICD-10-CM

## 2014-02-05 DIAGNOSIS — R059 Cough, unspecified: Secondary | ICD-10-CM

## 2014-02-05 DIAGNOSIS — K219 Gastro-esophageal reflux disease without esophagitis: Secondary | ICD-10-CM

## 2014-02-05 DIAGNOSIS — R300 Dysuria: Secondary | ICD-10-CM

## 2014-02-05 DIAGNOSIS — C50411 Malignant neoplasm of upper-outer quadrant of right female breast: Secondary | ICD-10-CM

## 2014-02-05 DIAGNOSIS — R350 Frequency of micturition: Secondary | ICD-10-CM

## 2014-02-05 DIAGNOSIS — C50919 Malignant neoplasm of unspecified site of unspecified female breast: Secondary | ICD-10-CM

## 2014-02-05 DIAGNOSIS — C773 Secondary and unspecified malignant neoplasm of axilla and upper limb lymph nodes: Secondary | ICD-10-CM

## 2014-02-05 DIAGNOSIS — R05 Cough: Secondary | ICD-10-CM

## 2014-02-05 LAB — URINALYSIS, MICROSCOPIC - CHCC
BILIRUBIN (URINE): NEGATIVE
BLOOD: NEGATIVE
GLUCOSE UR CHCC: NEGATIVE mg/dL
Ketones: NEGATIVE mg/dL
Leukocyte Esterase: NEGATIVE
Nitrite: NEGATIVE
Protein: NEGATIVE mg/dL
Specific Gravity, Urine: 1.005 (ref 1.003–1.035)
Urobilinogen, UR: 0.2 mg/dL (ref 0.2–1)
WBC, UA: NEGATIVE (ref 0–2)
pH: 7.5 (ref 4.6–8.0)

## 2014-02-05 MED ORDER — ONDANSETRON HCL 8 MG PO TABS: 8.0000 mg | ORAL_TABLET | Freq: Two times a day (BID) | ORAL | Status: DC | PRN

## 2014-02-05 MED ORDER — DEXAMETHASONE 4 MG PO TABS: ORAL_TABLET | ORAL | Status: DC

## 2014-02-05 MED ORDER — LORAZEPAM 0.5 MG PO TABS: 0.5000 mg | ORAL_TABLET | Freq: Every evening | ORAL | Status: DC | PRN

## 2014-02-05 MED ORDER — OMEPRAZOLE 40 MG PO CPDR: 40.0000 mg | DELAYED_RELEASE_CAPSULE | Freq: Every day | ORAL | Status: DC

## 2014-02-05 MED ORDER — PROCHLORPERAZINE MALEATE 10 MG PO TABS: ORAL_TABLET | ORAL | Status: DC

## 2014-02-05 MED ORDER — LIDOCAINE-PRILOCAINE 2.5-2.5 % EX CREA: TOPICAL_CREAM | CUTANEOUS | Status: DC

## 2014-02-05 NOTE — Progress Notes (Signed)
Rhonda [name]  [phone] [phone]     ID: Rhonda [name] OB: September 02, 1971  MR#: [id number]  CHY#:[id number]  PCP: Tereasa Coop, PA-C GYN:  Mora Bellman, MD SU: Excell Seltzer, MD OTHER MD: Arloa Koh, MD  CHIEF COMPLAINT: Right breast cancer/neoadjuvant chemotherapy   BREAST CANCER HISTORY: Rhonda [name] was visiting relatives in [place] about 4 months ago when she noted pain in her right breast. In 2003 she had a right breast infection which felt pretty much the same. She was treated with antibiotics and then (while living in [place]). Accordingly this time she minimized the symptoms. It felt like milk was coming down she says. She also had breast pain while having her period. Then in February she actually found 2 lumps in her right breast breast which she initially ignored. Eventually as the symptoms seemed to progress she saw a Dr. In [place] and he told her she likely had breast cancer. She waited until coming back [place] the states to seek definitive care.  On 12/17/2013 she had bilateral diagnostic mammography in [facility] In the upper outer right breast there were pleomorphic calcifications measuring approximately 9 cm in diameter. There were 4 distinct microlobulated masses in the right breast, 3 of them in the upper outer quadrant one in the lower outer quadrant. Each measures approximately 1.2 cm. Ultrasound showed a complex cystic mass in the superior central right breast, a 4 mm hypoechoic lesion, 3 suspicious solid masses elsewhere in the right breast, all of them hypoechoic and oval are round with increased vascularity. The largest of these masses measure 1.7 cm. Findings in the left breast were not suspicious or specific. The patient was referred to the breast Center where on 01/20/2014 she underwent biopsy of 2 of the right breast masses, and a right axillary lymph node. 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  On 01/28/2014 the patient underwent bilateral breast MRI. This showed, in the right breast, multiple irregular enhancing masses in the setting of clumped non-masslike enhancement the area in question measured up to 9.4 cm. There was diffuse asymmetric skin thickening and edema involving the right breast, but no obvious nipple or pectoralis involvement. There were several enlarged and morphologically abnormal right axillary lymph node as well as inter-pectoral and subpectoral adenopathy there the largest right axillary lymph node measured 2.7 cm. There was no internal mammary lymphadenopathy noted. The left breast was unremarkable.  Her subsequent history is as detailed below.  INTERVAL HISTORY: Rhonda [name] was evaluated in the multidisciplinary breast cancer clinic 01/29/2014. She returns today to discuss plans for neoadjuvant chemotherapy, currently scheduled for her first of 4 planned dose dense cycles of doxorubicin/cyclophosphamide next week on May 19. She is here to discuss her treatment plan and review her antinausea medications.   Since her appointment here on May 6, she did attend chemotherapy class which she found helpful. She scheduled for an echocardiogram later today, and is scheduled for staging scans next week on May 18, prior to her first dose of chemotherapy on the 19th.  Rhonda [name] is very concerned today about the possibility of fertility preservation. She tells me she really feels like she would like to have another baby in the future. She understands that at the age of 43, she is likely to become postmenopausal secondary to chemotherapy, and she would like to have  more information and explore her options prior to initiating therapy.   REVIEW OF SYSTEMS: Physically, Rhonda [name] has only a few complaints today. She has had some increased reflux and has  an occasional cough at night when she lies down. The cough is nonproductive. She has some urinary frequency which is new for her, with mild dysuria but no hematuria. She's had no fevers, chills, or night sweats. She denies any rashes or skin changes. She's had no signs of abnormal bruising or bleeding. Her energy level is fair. Her appetite is good.  She has some occasional nausea, but no emesis. She's had no recent change in bowel habits and denies either diarrhea or constipation. She's had no increased shortness of breath, orthopnea, peripheral swelling, chest pain, or palpitations. She denies abnormal headaches or dizziness. She currently denies any unusual myalgias, arthralgias, or bony pain.  A detailed review of systems is otherwise stable and noncontributory.    PAST MEDICAL HISTORY: Past Medical History  Diagnosis Date  . Hypertension   . Blood transfusion without reported diagnosis     3 previous   . Anemia   . Ulcer     hx of stomach ulcer a long time ago  . GERD (gastroesophageal reflux disease)     a long time ago  . Headache(784.0)   . Cancer     right breast cancer    PAST SURGICAL HISTORY: Past Surgical History  Procedure Laterality Date  . Cesarean section      2 previous  . Back surgery  2000  . Appendectomy      a long time ago in [place] Family History  Problem Relation Age of Onset  . Hypertension Mother   . Hypertension Father    the patient's parents are living but she is not sure of their age. She had 4 brothers and 4 sisters. 2 other brothers died in the [place] and one brother is currently in [place] but they do not know where are even if he is still alive. She has one brother in this area. There is no history of breast or ovarian cancer in the family to her knowledge.  GYNECOLOGIC HISTORY: (Reviewed 02/05/2014) Menarche somewhere between the ages of 43 and 43. First live birth age 43. The patient is GX P3. Her periods  are now irregular. She took birth control pills remotely for approximately one year with no complaints.  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 Her husband Rhonda [name] is a former Emergency planning/management officer to the [place] from [place] and is a Optometrist. Their children are Rhonda [name] (11),Rhonda [name] (8) and Rhonda [name] (4). The patient attends a [employer] locally    ADVANCED DIRECTIVES: Not in place   HEALTH MAINTENANCE: (Updated 02/05/2014) History  Substance Use Topics  . Smoking status: Never Smoker   . Smokeless tobacco: Never Used  . Alcohol Use: No     Colonoscopy: Never  PAP: April 2015/Dr. Constant  Bone density: Never  Lipid panel: Not on file  No Known Allergies  Current Outpatient Prescriptions  Medication Sig Dispense Refill  . baclofen (LIORESAL) 10 MG tablet Take 10 mg by mouth every morning.       . cholecalciferol (VITAMIN D) 1000 UNITS tablet Take 1,000 Units by mouth daily.      . hydrochlorothiazide (MICROZIDE) 12.5 MG capsule Take 12.5 mg by mouth daily as needed (headache).       . Prenatal Vit-Fe  Fumarate-FA (PRENATAL VITAMINS) 28-0.8 MG TABS Take 1 tablet by mouth daily.       [place] Kitchen acetaminophen (TYLENOL) 325 MG tablet Take 650 mg by mouth every 6 (six) hours as needed for mild pain or headache.       . dexamethasone (DECADRON) 4 MG tablet 2 tabs by mouth with food twice daily on day before and 3 days after each chemo  60 tablet  1  . lidocaine-prilocaine (EMLA) cream Apply to port 1 -2 hr before each procedure/port access as directed  30 g  6  . LORazepam (ATIVAN) 0.5 MG tablet Take 1 tablet (0.5 mg total) by mouth at bedtime as needed for anxiety or sleep (Nausea or vomiting).  30 tablet  0  . omeprazole (PRILOSEC) 40 MG capsule Take 1 capsule (40 mg total) by mouth daily.  30 capsule  6  . ondansetron (ZOFRAN) 8 MG tablet Take 1 tablet (8 mg total) by mouth 2 (two) times daily as needed for nausea or vomiting. Start on the third day after  chemotherapy.  30 tablet  1  . prochlorperazine (COMPAZINE) 10 MG tablet 1 tab by mouth with meals and bedtime x 3 days after chemo, then 1 tab by mouth every 6 hrs if needed for nausea  30 tablet  2   No current facility-administered medications for this visit.    OBJECTIVE: African woman who appears well but slightly anxious Filed Vitals:   02/05/14 0840  BP: 137/87  Pulse: 59  Temp: 98.5 F (36.9 C)  Resp: 18     Body mass index is 26.9 kg/(m^2).    ECOG FS: 0 Filed Weights   02/05/14 0840  Weight: 187 lb 8 oz (85.049 kg)   Remainder of physical exam was deferred today.    LAB RESULTS:   Lab Results  Component Value Date   WBC 5.0 01/29/2014   NEUTROABS 2.1 01/29/2014   HGB 12.1 01/29/2014   HCT 39.0 01/29/2014   MCV 70.7* 01/29/2014   PLT 206 01/29/2014      Chemistry      Component Value Date/Time   NA 145 01/29/2014 1231   NA 144 01/27/2014 1630   K 4.8 01/29/2014 1231   K 3.8 01/27/2014 1630   CL 104 01/27/2014 1630   CO2 28 01/29/2014 1231   BUN 16.0 01/29/2014 1231   BUN 21 01/27/2014 1630   CREATININE 0.8 01/29/2014 1231   CREATININE 0.70 01/27/2014 1630      Component Value Date/Time   CALCIUM 9.9 01/29/2014 1231   ALKPHOS 78 01/29/2014 1231   AST 19 01/29/2014 1231   ALT 10 01/29/2014 1231   BILITOT 0.59 01/29/2014 1231      STUDIES: Mr Breast Bilateral W Wo Contrast 01/28/2014   CLINICAL DATA:  43 year old female with recently diagnosed 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  LABS:  Most recent serum creatinine 0.7 milligrams/deciliter.  EXAM: BILATERAL BREAST MRI WITH AND WITHOUT CONTRAST  TECHNIQUE: Multiplanar, multisequence MR images of both breasts were obtained prior to and following the intravenous administration of 42m of MultiHance.  THREE-DIMENSIONAL MR IMAGE RENDERING ON INDEPENDENT WORKSTATION:  Three-dimensional MR images were rendered by post-processing of the  original MR data on an independent workstation. The three-dimensional MR images were interpreted, and findings are reported in the following complete MRI report for this study. Three dimensional images were evaluated at the independent DynaCad  workstation  COMPARISON:  Previous exams  FINDINGS: Breast composition: c:  Heterogeneous fibroglandular tissue  Background parenchymal enhancement: Moderate  Right breast: Multiple irregular enhancing masses with associated contiguous and surrounding areas of clumped non mass enhancement are seen throughout the central, outer, and upper-outer right breast compatible with known biopsy proven malignancy. This measures up to 9.4 cm in greatest AP dimension, approximately 5 cm transverse, and up to 7 cm craniocaudal. There is diffuse asymmetric skin thickening as well as edema involving the right breast. No obvious nipple involvement. No pectoralis involvement.  Left breast: No suspicious rapidly enhancing masses or abnormal areas of enhancement in the left breast to suggest malignancy. A small oval circumscribed mass in the upper-outer left breast measuring 0.5 x 0.6 x 0.6 cm corresponds with a benign appearing lymph node seen on the recent outside ultrasound.  Lymph nodes: Several enlarged and morphologically abnormal right axillary, interpectoral, and subpectoral lymph nodes are identified compatible with known biopsy proven axillary metastases, with a representative enlarged right axillary lymph node measuring up to 2.7 cm AP. No internal mammary lymphadenopathy seen.  Ancillary findings:  None.  IMPRESSION: Extensive malignancy involving predominately the central, outer, and upper-outer right breast measuring up to 9.4 cm in greatest AP dimension with associated right axillary (biopsy proven), interpectoral, and subpectoral lymphadenopathy.  RECOMMENDATION: Treatment for known biopsy proven malignancy in the right breast.  BI-RADS CATEGORY  6: Known biopsy-proven malignancy.    Electronically Signed   By: Everlean Alstrom M.D.   On: 01/28/2014 09:25       ASSESSMENT: 43 y.o. [place] woman [place] from [place]  (1)   status post right breast and right axillary lymph node biopsy 01/20/2014, both positive for a clinically T3 N1-2, stage III a invasive ductal carcinoma, grade 2, estrogen and progesterone receptor negative, HER-2 positive, with an MIB-1 of 85%  (2)  to be treated in the neoadjuvant setting, the plan being to proceed with 4 dose dense cycles of doxorubicin/cyclophosphamide (first treatment 02/11/2014), with Neulasta on day 2 for granulocyte support. This is to be followed by weekly paclitaxel x12, given along with trastuzumab/pertuzumab every 3 weeks prior to definitive surgery.   (3)  Patient will likely need a right modified radical mastectomy, followed by postmastectomy radiation.   (4)  trastuzumab will be continued every 3 weeks for total of one year.  PLAN: Our entire 45 minute appointment today was spent reviewing the patient's diagnosis, answering her questions and addressing her concerns, reviewing her treatment plan, reviewing her antinausea medications, and coordinating care.   She is scheduled to return next week on May 19 for her first cycle of dose dense doxorubicin/cyclophosphamide. Of course prior to that treatment she will have her echocardiogram, as well as staging studies including a PET scan and chest CT. We will review all of those results together on the 19th prior to initiating her chemotherapy.  She is also scheduled for port placement under the care of Dr. Silverio Decamp later this week.[place]   Terrance also spoke with our Breast Navigator, Iris Pert, today regarding her concerns about fertility, and was given information regarding a fertility consultation.  She will try to have that scheduled prior to her appointment here next week, and will call us in the next couple of days with an update.  We did go ahead and discuss her  antinausea regimen today which will include: Dexamethasone (8 mg by mouth with food twice daily x3 days after chemotherapy); prochlorperazine (10 mg by mouth  with meals and bedtime x3 days after chemotherapy, then every 6 hours if needed for nausea); Lorazepam (0.5 mg at bedtime as needed for anxiety/insomnia/nausea); and ondansetron (8 mg twice daily as needed for nausea). We also discussed the use of Claritin for 5-7 days following the Neulasta injection to decrease bone pain. She was given a prescription for omeprazole, 40 mg daily, to help with her reflux which is likely to worsen when she begins the oral dexamethasone.  Finally, she was given a prescription for EMLA cream with instructions on how to utilize that prior to port access. All of these instructions were given to the patient in writing today, and she was able to verbalize her understanding of how to take all of these medications appropriately.  Due to her urinary frequency and mild dysuria, I have ordered a urinalysis and urine culture to evaluate for possible urinary tract infection today.  All of the above was reviewed in detail with Rhonda [name] today, and she voices her understanding and agreement with this plan. She does know to call prior to her appointment here next week with any changes or problems.   Theotis Burrow, PA-C   02/05/2014 4:39 PM

## 2014-02-05 NOTE — Telephone Encounter (Signed)
, °

## 2014-02-05 NOTE — Progress Notes (Signed)
Echo Lab  2D Echocardiogram completed.  [place], [place] 02/05/2014 12:23 PM

## 2014-02-06 ENCOUNTER — Encounter (HOSPITAL_COMMUNITY): Payer: Self-pay | Admitting: *Deleted

## 2014-02-06 ENCOUNTER — Ambulatory Visit (HOSPITAL_COMMUNITY): Payer: Medicaid Other | Admitting: Anesthesiology

## 2014-02-06 ENCOUNTER — Telehealth: Payer: Self-pay | Admitting: *Deleted

## 2014-02-06 ENCOUNTER — Ambulatory Visit (HOSPITAL_COMMUNITY)
Admission: RE | Admit: 2014-02-06 | Discharge: 2014-02-06 | Disposition: A | Discharge status: Home / Self Care | Payer: Medicaid Other | Source: Ambulatory Visit | Attending: General Surgery | Admitting: General Surgery

## 2014-02-06 ENCOUNTER — Encounter (HOSPITAL_COMMUNITY)
Admission: RE | Disposition: A | Discharge status: Home / Self Care | Payer: Self-pay | Source: Ambulatory Visit | Attending: General Surgery

## 2014-02-06 ENCOUNTER — Ambulatory Visit (HOSPITAL_COMMUNITY): Payer: Medicaid Other

## 2014-02-06 ENCOUNTER — Encounter (HOSPITAL_COMMUNITY): Payer: Medicaid Other | Admitting: Anesthesiology

## 2014-02-06 DIAGNOSIS — C50411 Malignant neoplasm of upper-outer quadrant of right female breast: Secondary | ICD-10-CM

## 2014-02-06 DIAGNOSIS — C50919 Malignant neoplasm of unspecified site of unspecified female breast: Secondary | ICD-10-CM | POA: Insufficient documentation

## 2014-02-06 DIAGNOSIS — Z791 Long term (current) use of non-steroidal anti-inflammatories (NSAID): Secondary | ICD-10-CM | POA: Insufficient documentation

## 2014-02-06 DIAGNOSIS — Z171 Estrogen receptor negative status [ER-]: Secondary | ICD-10-CM | POA: Insufficient documentation

## 2014-02-06 DIAGNOSIS — Z79899 Other long term (current) drug therapy: Secondary | ICD-10-CM | POA: Insufficient documentation

## 2014-02-06 DIAGNOSIS — D649 Anemia, unspecified: Secondary | ICD-10-CM | POA: Insufficient documentation

## 2014-02-06 DIAGNOSIS — I10 Essential (primary) hypertension: Secondary | ICD-10-CM | POA: Insufficient documentation

## 2014-02-06 HISTORY — PX: PORTACATH PLACEMENT: SHX2246

## 2014-02-06 LAB — URINE CULTURE

## 2014-02-06 SURGERY — INSERTION, TUNNELED CENTRAL VENOUS DEVICE, WITH PORT
Anesthesia: General | Site: Chest

## 2014-02-06 MED ORDER — FENTANYL CITRATE 0.05 MG/ML IJ SOLN
INTRAMUSCULAR | Status: DC | PRN
  Administered 2014-02-06: 50 ug via INTRAVENOUS

## 2014-02-06 MED ORDER — MIDAZOLAM HCL 2 MG/2ML IJ SOLN
INTRAMUSCULAR | Status: AC
  Filled 2014-02-06: qty 2

## 2014-02-06 MED ORDER — FENTANYL CITRATE 0.05 MG/ML IJ SOLN
25.0000 ug | INTRAMUSCULAR | Status: DC | PRN
Start: 2014-02-06 — End: 2014-02-06
  Administered 2014-02-06 (×2): 50 ug via INTRAVENOUS

## 2014-02-06 MED ORDER — PROMETHAZINE HCL 25 MG/ML IJ SOLN: 6.2500 mg | INTRAMUSCULAR | Status: DC | PRN

## 2014-02-06 MED ORDER — BUPIVACAINE-EPINEPHRINE 0.25% -1:200000 IJ SOLN
INTRAMUSCULAR | Status: DC | PRN
  Administered 2014-02-06: 10 mL

## 2014-02-06 MED ORDER — SODIUM CHLORIDE 0.9 % IR SOLN
Freq: Once | Status: AC
  Administered 2014-02-06: 10:00:00
  Filled 2014-02-06: qty 1.2

## 2014-02-06 MED ORDER — FENTANYL CITRATE 0.05 MG/ML IJ SOLN
INTRAMUSCULAR | Status: AC
  Filled 2014-02-06: qty 2

## 2014-02-06 MED ORDER — CEFAZOLIN SODIUM-DEXTROSE 2-3 GM-% IV SOLR
INTRAVENOUS | Status: AC
  Filled 2014-02-06: qty 50

## 2014-02-06 MED ORDER — LIDOCAINE HCL 1 % IJ SOLN
INTRAMUSCULAR | Status: DC | PRN
  Administered 2014-02-06: 50 mg via INTRADERMAL

## 2014-02-06 MED ORDER — LIDOCAINE HCL (CARDIAC) 20 MG/ML IV SOLN
INTRAVENOUS | Status: AC
  Filled 2014-02-06: qty 5

## 2014-02-06 MED ORDER — HEPARIN SOD (PORK) LOCK FLUSH 100 UNIT/ML IV SOLN
INTRAVENOUS | Status: AC
  Filled 2014-02-06: qty 5

## 2014-02-06 MED ORDER — PROPOFOL 10 MG/ML IV BOLUS
INTRAVENOUS | Status: DC | PRN
  Administered 2014-02-06: 200 mg via INTRAVENOUS

## 2014-02-06 MED ORDER — ONDANSETRON HCL 4 MG/2ML IJ SOLN
INTRAMUSCULAR | Status: DC | PRN
  Administered 2014-02-06: 4 mg via INTRAVENOUS

## 2014-02-06 MED ORDER — HYDROCODONE-ACETAMINOPHEN 5-325 MG PO TABS: 1.0000 | ORAL_TABLET | ORAL | Status: DC | PRN

## 2014-02-06 MED ORDER — SODIUM CHLORIDE 0.9 % IJ SOLN
INTRAMUSCULAR | Status: AC
  Filled 2014-02-06: qty 10

## 2014-02-06 MED ORDER — MIDAZOLAM HCL 5 MG/5ML IJ SOLN
INTRAMUSCULAR | Status: DC | PRN
  Administered 2014-02-06: 2 mg via INTRAVENOUS

## 2014-02-06 MED ORDER — LACTATED RINGERS IV SOLN: INTRAVENOUS | Status: DC

## 2014-02-06 MED ORDER — DEXAMETHASONE SODIUM PHOSPHATE 10 MG/ML IJ SOLN
INTRAMUSCULAR | Status: AC
  Filled 2014-02-06: qty 1

## 2014-02-06 MED ORDER — CEFAZOLIN SODIUM-DEXTROSE 2-3 GM-% IV SOLR
2.0000 g | INTRAVENOUS | Status: AC
Start: 2014-02-06 — End: 2014-02-06
  Administered 2014-02-06: 2 g via INTRAVENOUS

## 2014-02-06 MED ORDER — MEPERIDINE HCL 50 MG/ML IJ SOLN: 6.2500 mg | INTRAMUSCULAR | Status: DC | PRN

## 2014-02-06 MED ORDER — PROPOFOL 10 MG/ML IV BOLUS
INTRAVENOUS | Status: AC
  Filled 2014-02-06: qty 20

## 2014-02-06 MED ORDER — BUPIVACAINE-EPINEPHRINE 0.25% -1:200000 IJ SOLN
INTRAMUSCULAR | Status: AC
  Filled 2014-02-06: qty 1

## 2014-02-06 MED ORDER — DEXAMETHASONE SODIUM PHOSPHATE 10 MG/ML IJ SOLN
INTRAMUSCULAR | Status: DC | PRN
  Administered 2014-02-06: 10 mg via INTRAVENOUS

## 2014-02-06 MED ORDER — LACTATED RINGERS IV SOLN
INTRAVENOUS | Status: DC | PRN
  Administered 2014-02-06: 09:00:00 via INTRAVENOUS

## 2014-02-06 MED ORDER — EPHEDRINE SULFATE 50 MG/ML IJ SOLN
INTRAMUSCULAR | Status: AC
  Filled 2014-02-06: qty 1

## 2014-02-06 MED ORDER — HEPARIN SOD (PORK) LOCK FLUSH 100 UNIT/ML IV SOLN
INTRAVENOUS | Status: DC | PRN
  Administered 2014-02-06: 500 [IU] via INTRAVENOUS

## 2014-02-06 MED ORDER — ONDANSETRON HCL 4 MG/2ML IJ SOLN
INTRAMUSCULAR | Status: AC
  Filled 2014-02-06: qty 2

## 2014-02-06 SURGICAL SUPPLY — 32 items
BAG DECANTER FOR FLEXI CONT (MISCELLANEOUS) ×3 IMPLANT
BENZOIN TINCTURE PRP APPL 2/3 (GAUZE/BANDAGES/DRESSINGS) ×3 IMPLANT
BLADE HEX COATED 2.75 (ELECTRODE) ×3 IMPLANT
BLADE SURG 15 STRL LF DISP TIS (BLADE) ×1 IMPLANT
BLADE SURG 15 STRL SS (BLADE) ×2
CLOSURE WOUND 1/2 X4 (GAUZE/BANDAGES/DRESSINGS)
DECANTER SPIKE VIAL GLASS SM (MISCELLANEOUS) IMPLANT
DERMABOND ADVANCED (GAUZE/BANDAGES/DRESSINGS) ×2
DERMABOND ADVANCED .7 DNX12 (GAUZE/BANDAGES/DRESSINGS) ×1 IMPLANT
DRAPE C-ARM 42X120 X-RAY (DRAPES) ×3 IMPLANT
DRAPE LAPAROTOMY TRNSV 102X78 (DRAPE) ×3 IMPLANT
ELECT REM PT RETURN 9FT ADLT (ELECTROSURGICAL) ×3
ELECTRODE REM PT RTRN 9FT ADLT (ELECTROSURGICAL) ×1 IMPLANT
GAUZE SPONGE 4X4 16PLY XRAY LF (GAUZE/BANDAGES/DRESSINGS) ×3 IMPLANT
GLOVE BIOGEL PI IND STRL 7.0 (GLOVE) ×1 IMPLANT
GLOVE BIOGEL PI INDICATOR 7.0 (GLOVE) ×2
GOWN STRL REUS W/TWL XL LVL3 (GOWN DISPOSABLE) ×6 IMPLANT
KIT BASIN OR (CUSTOM PROCEDURE TRAY) ×3 IMPLANT
KIT PORT POWER 8FR ISP CVUE (Catheter) ×3 IMPLANT
MARKER SKIN DUAL TIP RULER LAB (MISCELLANEOUS) ×3 IMPLANT
NEEDLE HYPO 22GX1.5 SAFETY (NEEDLE) IMPLANT
NEEDLE HYPO 25X1 1.5 SAFETY (NEEDLE) ×3 IMPLANT
NS IRRIG 1000ML POUR BTL (IV SOLUTION) ×3 IMPLANT
PACK BASIC VI WITH GOWN DISP (CUSTOM PROCEDURE TRAY) ×3 IMPLANT
PENCIL BUTTON HOLSTER BLD 10FT (ELECTRODE) ×3 IMPLANT
SPONGE GAUZE 4X4 12PLY (GAUZE/BANDAGES/DRESSINGS) ×3 IMPLANT
STRIP CLOSURE SKIN 1/2X4 (GAUZE/BANDAGES/DRESSINGS) IMPLANT
SUT MNCRL AB 4-0 PS2 18 (SUTURE) ×3 IMPLANT
SUT PROLENE 2 0 CT2 30 (SUTURE) ×3 IMPLANT
SYR CONTROL 10ML LL (SYRINGE) ×3 IMPLANT
SYRINGE 10CC LL (SYRINGE) ×6 IMPLANT
TOWEL OR 17X26 10 PK STRL BLUE (TOWEL DISPOSABLE) ×3 IMPLANT

## 2014-02-06 NOTE — Transfer of Care (Signed)
Immediate Anesthesia Transfer of Care Note  Patient: Rhonda [name]  Procedure(s) Performed: Procedure(s): INSERTION PORT-A-CATH (N/A)  Patient Location: PACU  Anesthesia Type:General  Level of Consciousness: awake, alert  and oriented  Airway & Oxygen Therapy: Patient Spontanous Breathing and Patient connected to face mask oxygen  Post-op Assessment: Report given to PACU RN and Post -op Vital signs reviewed and stable  Post vital signs: Reviewed and stable  Complications: No apparent anesthesia complications

## 2014-02-06 NOTE — Op Note (Signed)
Preoperative diagnosis: Cancer of the breast and the poor venous access  Postoperative diagnosis: Same  Procedure: Placement of ClearVue subcutaneous venous port  Surgeon: Excell Seltzer M.D.  Anesthesia: General LMA  Description of procedure: Patient is brought to the operating room and placed in the supine position on the operating table. IV sedation was administered. The entire upper chest and neck were widely sterilely prepped and draped. Local anesthesia was used to infiltrate the insertion of port site. The left subclavian vein was cannulated with a needle and guidewire without difficulty and position in the superior vena cava was confirmed by fluoroscopy. The introducer was then placed over the guidewire and the flushed catheter placed via the introducer which was stripped away and the tip of the catheter positioned near the cavoatrial junction. A small transverse incision was made in the anterior chest wall and subcutaneous pocket created. The catheter was tunneled into the pocket, trimmed to length, and attached to the flushed port which was positioned in the pocket. The port was sutured to the chest wall with interrupted 2-0 Prolene. The incisions were closed with subcutaneous interrupted Monocryl and the skin incisions closed with subcuticular Monocryl and Dermabond. The port was accessed and flushed and aspirated easily and was left flushed with concentrated heparin solution. Sponge needle as the counts were correct. The patient was taken to recovery in good condition.  Rhonda [name]  02/06/2014

## 2014-02-06 NOTE — Progress Notes (Signed)
X-ray results noted 

## 2014-02-06 NOTE — Anesthesia Postprocedure Evaluation (Signed)
  Anesthesia Post-op Note  Patient: Rhonda [name]  Procedure(s) Performed: Procedure(s) (LRB): INSERTION PORT-A-CATH (N/A)  Patient Location: PACU  Anesthesia Type: General  Level of Consciousness: awake and alert   Airway and Oxygen Therapy: Patient Spontanous Breathing  Post-op Pain: mild  Post-op Assessment: Post-op Vital signs reviewed, Patient's Cardiovascular Status Stable, Respiratory Function Stable, Patent Airway and No signs of Nausea or vomiting  Last Vitals:  Filed Vitals:   02/06/14 1225  BP: 136/71  Pulse: 50  Temp: 36.6 C  Resp: 18    Post-op Vital Signs: stable   Complications: No apparent anesthesia complications

## 2014-02-06 NOTE — Telephone Encounter (Signed)
Call pt to inform her of Urinalysis results. No answer but left a detailed message informing pt she has no UTI. If pt has any questions she can call this nurse back at [phone]. Message to be forwarded to [employer], PA-C.

## 2014-02-06 NOTE — Interval H&P Note (Signed)
History and Physical Interval Note:  02/06/2014 9:16 AM  Rhonda [name]  has presented today for surgery, with the diagnosis of cancer of the breast  The various methods of treatment have been discussed with the patient and family. After consideration of risks, benefits and other options for treatment, the patient has consented to  Procedure(s): INSERTION PORT-A-CATH (N/A) as a surgical intervention .  The patient's history has been reviewed, patient examined, no change in status, stable for surgery.  I have reviewed the patient's chart and labs.  Questions were answered to the patient's satisfaction.     Darene Lamer Kayde Warehime

## 2014-02-06 NOTE — Discharge Instructions (Signed)
    PORT-A-CATH: POST OP INSTRUCTIONS  Always review your discharge instruction sheet given to you by the facility where your surgery was performed.   1. A prescription for pain medication may be given to you upon discharge. Take your pain medication as prescribed, if needed. If narcotic pain medicine is not needed, then you make take acetaminophen (Tylenol) or ibuprofen (Advil) as needed.  2. Take your usually prescribed medications unless otherwise directed. 3. If you need a refill on your pain medication, please contact our office. All narcotic pain medicine now requires a paper prescription.  Phoned in and fax refills are no longer allowed by law.  Prescriptions will not be filled after 5 pm or on weekends.  4. You should follow a light diet for the remainder of the day after your procedure. 5. Most patients will experience some mild swelling and/or bruising in the area of the incision. It may take several days to resolve. 6. It is common to experience some constipation if taking pain medication after surgery. Increasing fluid intake and taking a stool softener (such as Colace) will usually help or prevent this problem from occurring. A mild laxative (Milk of Magnesia or Miralax) should be taken according to package directions if there are no bowel movements after 48 hours.  7. Unless discharge instructions indicate otherwise, you may remove your bandages 48 hours after surgery, and you may shower at that time. You may have steri-strips (small white skin tapes) in place directly over the incision.  These strips should be left on the skin for 7-10 days.  If your surgeon used Dermabond (skin glue) on the incision, you may shower in 24 hours.  The glue will flake off over the next 2-3 weeks.  8. If your port is left accessed at the end of surgery (needle left in port), the dressing cannot get wet and should only by changed by a healthcare professional. When the port is no longer accessed (when the  needle has been removed), follow step 7.   9. ACTIVITIES:  Limit activity involving your arms for the next 72 hours. Do no strenuous exercise or activity for 1 week. You may drive when you are no longer taking prescription pain medication, you can comfortably wear a seatbelt, and you can maneuver your car. 10.You may need to see your doctor in the office for a follow-up appointment.  Please       check with your doctor.  11.When you receive a new Port-a-Cath, you will get a product guide and        ID card.  Please keep them in case you need them.  WHEN TO CALL YOUR DOCTOR ([phone]): 1. Fever over 101.0 2. Chills 3. Continued bleeding from incision 4. Increased redness and tenderness at the site 5. Shortness of breath, difficulty breathing   The clinic staff is available to answer your questions during regular business hours. Please don't hesitate to call and ask to speak to one of the nurses or medical assistants for clinical concerns. If you have a medical emergency, go to the nearest emergency room or call 911.  A surgeon from [facility] is always on call at the hospital.     For further information, please visit [email or web address]      

## 2014-02-06 NOTE — Anesthesia Preprocedure Evaluation (Addendum)
Anesthesia Evaluation  Patient identified by MRN, date of birth, ID band Patient awake    Reviewed: Allergy & Precautions, H&P , NPO status , Patient's Chart, lab work & pertinent test results  Airway Mallampati: II TM Distance: >3 FB Neck ROM: Full    Dental no notable dental hx.    Pulmonary neg pulmonary ROS,  breath sounds clear to auscultation  Pulmonary exam normal       Cardiovascular hypertension, Pt. on medications negative cardio ROS  Rhythm:Regular Rate:Normal     Neuro/Psych negative neurological ROS  negative psych ROS   GI/Hepatic negative GI ROS, Neg liver ROS,   Endo/Other  negative endocrine ROS  Renal/GU negative Renal ROS  negative genitourinary   Musculoskeletal negative musculoskeletal ROS (+)   Abdominal   Peds negative pediatric ROS (+)  Hematology negative hematology ROS (+)   Anesthesia Other Findings   Reproductive/Obstetrics negative OB ROS                          Anesthesia Physical Anesthesia Plan  ASA: II  Anesthesia Plan: General   Post-op Pain Management:    Induction: Intravenous  Airway Management Planned: LMA  Additional Equipment:   Intra-op Plan:   Post-operative Plan: Extubation in OR  Informed Consent: I have reviewed the patients History and Physical, chart, labs and discussed the procedure including the risks, benefits and alternatives for the proposed anesthesia with the patient or authorized representative who has indicated his/her understanding and acceptance.   Dental advisory given  Plan Discussed with: CRNA  Anesthesia Plan Comments:         Anesthesia Quick Evaluation

## 2014-02-06 NOTE — H&P (View-Only) (Signed)
Chief Complaint: New diagnosis of breast cancer  History:    Rhonda [name] is a 43 y.o. premenopausal female referred by Dr. 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 The patient is originally from the [place]. She was in [place] about 2-3 months ago when she was able to feel a mass in the outer right breast as well as under her right arm. She was evaluated there but is a [place] citizen and wanted to return to the [place] for treatment. She subsequently returned to this country and saw her primary physician Dr. Michaels and was referred to the breast center for further evaluation.  Subsequent imaging included diagnostic mamogram showing pleomorphic calcifications in the upper-outer right breast spanning an area of about 9 cm. 4 distinct well-circumscribed masses were seen in the upper outer and lower outer right breast measuring approximately 1.2 cm each and ultrasound showing multiple suspicious findings this included a 2 cm complex cystic mass in the superior central right breast, a 4 mm hypoechoic lesion in the upper-outer right breast 4 cm from the nipple, and 3 other suspicious solid masses all in the upper outer quadrant..   A ultrasound guided biopsy was performed on 01/21/2014 of 2 of the masses measuring 2.3 and 1.6 cm and a right axillary lymph node. 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 Subsequent bilateral breast MRI was performed which was negative in the left breast. On the right there were noted to be several enlarged axillary, interpectoral and subpectoral lymph nodes measuring up to 2.7 cm. There was extensive malignancy involving the central outer and upper right breast measuring up to 9.4 cm. She is seen now in multidisciplinary breast clinic for initial treatment planning.  She has experienced some mild breast fullness and discomfort. No nipple discharge..  She does have a  personal history of right breast abscess a number of years ago after birth of her first child.  Findings at that time were the following:  Tumor size: total of 9 cm  Tumor grade: 2  Estrogen Receptor: negative Progesterone Receptor: negative  Her-2 neu: positive  Lymph node status: positive   Past Medical History  Diagnosis Date  . Hypertension   . Blood transfusion without reported diagnosis     3 previous   . Anemia     Past Surgical History  Procedure Laterality Date  . Cesarean section      2 previous  . Back surgery  2000  . Appendectomy      Current Outpatient Prescriptions  Medication Sig Dispense Refill  . acetaminophen (TYLENOL) 325 MG tablet Take 650 mg by mouth every 6 (six) hours as needed.      . baclofen (LIORESAL) 10 MG tablet Take 10 mg by mouth.      . ergocalciferol (VITAMIN D2) 50000 UNITS capsule Take 1 tablet by mouth.      . hydrochlorothiazide (MICROZIDE) 12.5 MG capsule Take 12.5 mg by mouth.      . ibuprofen (ADVIL,MOTRIN) 200 MG tablet Take 200 mg by mouth every 6 (six) hours as needed.      . Prenatal Vit-Fe Fumarate-FA (PRENATAL VITAMINS) 28-0.8 MG TABS Take 1 tablet by mouth.       No current facility-administered medications for this visit.    Family History  Problem Relation Age of Onset  . Hypertension Mother   . Hypertension Father     History   Social History  . Marital Status:   Married    Spouse Name: N/A    Number of Children: N/A  . Years of Education: N/A   Social History Main Topics  . Smoking status: Never Smoker   . Smokeless tobacco: Never Used  . Alcohol Use: No  . Drug Use: No  . Sexual Activity: Yes    Birth Control/ Protection: None   Other Topics Concern  . Not on file   Social History Narrative  . No narrative on file     Review of Systems A comprehensive review of systems was negative.     Objective:  LMP 11/02/2013  General: Alert, well-developed African female, in no distress Skin: Warm and  dry without rash or infection. HEENT: No palpable masses or thyromegaly. Sclera nonicteric. Pupils equal round and reactive. Oropharynx clear. Breasts: the right breast is mildly swollen and enlarged with mild edema around the nipple. There is a large firm irregular mass or multiple masses palpable in the upper outer and lateral breast. No fixation. There is palpable none fixed left axillary adenopathy. Right breast is negative to exam. Lymph nodes: No cervical, supraclavicular, or inguinal nodes palpable. Lungs: Breath sounds clear and equal without increased work of breathing Cardiovascular: Regular rate and rhythm without murmur. No JVD or edema. Peripheral pulses intact. Abdomen: Nondistended. Soft and nontender. No masses palpable. No organomegaly. No palpable hernias. Extremities: No edema or joint swelling or deformity. No chronic venous stasis changes. Neurologic: Alert and fully oriented. Gait normal.   Laboratory data:  CBC:  Lab Results  Component Value Date   WBC 5.0 01/29/2014   RBC 5.52* 01/29/2014   HGB 12.1 01/29/2014   HGB 14.6 01/27/2014   HCT 39.0 01/29/2014   HCT 43.0 01/27/2014   PLT 206 01/29/2014  ]  [facility] Labs:  Lab Results  Component Value Date   NA 145 01/29/2014   NA 144 01/27/2014   K 4.8 01/29/2014   K 3.8 01/27/2014   CL 104 01/27/2014   CO2 28 01/29/2014   BUN 16.0 01/29/2014   BUN 21 01/27/2014   CREATININE 0.8 01/29/2014   CREATININE 0.70 01/27/2014   CALCIUM 9.9 01/29/2014   PROT 8.0 01/29/2014   BILITOT 0.59 01/29/2014   ALKPHOS 78 01/29/2014   AST 19 01/29/2014   ALT 10 01/29/2014     Assessment  43 y.o. female with a new diagnosis of cancer of the the right breast Central, upper upper outer and lateral.  Clinical IIIA, estrogen receptor negative, progesterone receptor negative and Her2/Neu protein/oncogene positive. I discussed with the patient  initial surgical treatment options. She clearly would be best served with neoadjuvant chemotherapy to get her systemic treatment started  and possibly down stage for local disease. We discussed options of breast conservation with lumpectomy or total mastectomy and the ultimate need for axillary dissection. I think it is likely she will require mastectomy particularly with the degree of calcifications representing DCIS but we discussed that for now we did not have to make this decision and there is the possibility that she could be down staged to the point of being a candidate for breast conservation.Options for reconstruction were discussed. We discussed Port-A-Cath placement including its indication in nature the surgery as well as risks of anesthetic complications, bleeding, infection, pneumothorax and subsequent risks of catheter malfunction or displacement or occlusion and DVT. She will need genetic testing. Metastatic workup is planned.we will plan to proceed with Port-A-Cath placement and subsequent followup during her neoadjuvant treatment. Plan Port-A-Cath placement as an   outpatient to begin neoadjuvant treatment. Metastatic workup and genetic counseling.  Edward Jolly MD, FACS  01/29/2014, 3:05 PM

## 2014-02-06 NOTE — Progress Notes (Signed)
Dr. Marcell Barlow made aware of patient's heart rates being in the 14s

## 2014-02-06 NOTE — Progress Notes (Signed)
Portable upright chest x-ray done. 

## 2014-02-07 ENCOUNTER — Encounter (HOSPITAL_COMMUNITY): Payer: Self-pay | Admitting: General Surgery

## 2014-02-07 ENCOUNTER — Encounter: Payer: Self-pay | Admitting: Oncology

## 2014-02-07 ENCOUNTER — Telehealth: Payer: Self-pay | Admitting: *Deleted

## 2014-02-07 NOTE — Telephone Encounter (Signed)
Called and spoke with patient about her appointment with a fertility clinic.  She states she called [facility] from the pamphlet and it was the wrong number.  Informed her I would look into this and get back with her.  Was able to get her an appointment at [facility] with Dr. Donneta Romberg on 02/10/14 at 8am.  She will keep her appointments her with Brooke Dare on 02/11/14 to discuss scan results and her fertility appointment.  Faxed notes to [place] at [facility].

## 2014-02-07 NOTE — Progress Notes (Unsigned)
02/06/2014 I spoke with radiology to have pet scan rescheduled because of denial from [place] medicaid.  I will submit the results of chest ct scan for the pet scan approval.   [phone]

## 2014-02-10 ENCOUNTER — Encounter (HOSPITAL_COMMUNITY): Payer: Self-pay

## 2014-02-10 ENCOUNTER — Ambulatory Visit (HOSPITAL_COMMUNITY): Payer: Medicaid Other

## 2014-02-10 ENCOUNTER — Ambulatory Visit (HOSPITAL_COMMUNITY)
Admission: RE | Admit: 2014-02-10 | Discharge: 2014-02-10 | Disposition: A | Discharge status: Home / Self Care | Payer: Medicaid Other | Source: Ambulatory Visit | Attending: Oncology | Admitting: Oncology

## 2014-02-10 DIAGNOSIS — C50019 Malignant neoplasm of nipple and areola, unspecified female breast: Secondary | ICD-10-CM | POA: Insufficient documentation

## 2014-02-10 DIAGNOSIS — C50411 Malignant neoplasm of upper-outer quadrant of right female breast: Secondary | ICD-10-CM

## 2014-02-10 MED ORDER — IOHEXOL 300 MG/ML  SOLN
80.0000 mL | Freq: Once | INTRAMUSCULAR | Status: AC | PRN
  Administered 2014-02-10: 80 mL via INTRAVENOUS

## 2014-02-11 ENCOUNTER — Other Ambulatory Visit: Payer: Medicaid Other

## 2014-02-11 ENCOUNTER — Ambulatory Visit (HOSPITAL_BASED_OUTPATIENT_CLINIC_OR_DEPARTMENT_OTHER): Payer: Medicaid Other | Admitting: Physician Assistant

## 2014-02-11 ENCOUNTER — Other Ambulatory Visit (HOSPITAL_BASED_OUTPATIENT_CLINIC_OR_DEPARTMENT_OTHER): Payer: Medicaid Other

## 2014-02-11 ENCOUNTER — Other Ambulatory Visit: Payer: Self-pay | Admitting: Oncology

## 2014-02-11 ENCOUNTER — Telehealth: Payer: Self-pay | Admitting: Physician Assistant

## 2014-02-11 ENCOUNTER — Ambulatory Visit (HOSPITAL_BASED_OUTPATIENT_CLINIC_OR_DEPARTMENT_OTHER): Payer: Medicaid Other

## 2014-02-11 ENCOUNTER — Encounter: Payer: Self-pay | Admitting: Physician Assistant

## 2014-02-11 VITALS — BP 128/83 | HR 71 | Temp 98.3°F | Resp 18 | Ht 70.0 in | Wt 187.2 lb

## 2014-02-11 DIAGNOSIS — C50919 Malignant neoplasm of unspecified site of unspecified female breast: Secondary | ICD-10-CM

## 2014-02-11 DIAGNOSIS — C50411 Malignant neoplasm of upper-outer quadrant of right female breast: Secondary | ICD-10-CM

## 2014-02-11 DIAGNOSIS — K219 Gastro-esophageal reflux disease without esophagitis: Secondary | ICD-10-CM

## 2014-02-11 DIAGNOSIS — C773 Secondary and unspecified malignant neoplasm of axilla and upper limb lymph nodes: Secondary | ICD-10-CM

## 2014-02-11 DIAGNOSIS — C50419 Malignant neoplasm of upper-outer quadrant of unspecified female breast: Secondary | ICD-10-CM

## 2014-02-11 DIAGNOSIS — Z170 Estrogen receptor positive status [ER+]: Secondary | ICD-10-CM

## 2014-02-11 DIAGNOSIS — Z5111 Encounter for antineoplastic chemotherapy: Secondary | ICD-10-CM

## 2014-02-11 LAB — CBC WITH DIFFERENTIAL/PLATELET
BASO%: 0.3 % (ref 0.0–2.0)
Basophils Absolute: 0 10*3/uL (ref 0.0–0.1)
EOS ABS: 0 10*3/uL (ref 0.0–0.5)
EOS%: 1 % (ref 0.0–7.0)
HCT: 40.2 % (ref 34.8–46.6)
HGB: 12.5 g/dL (ref 11.6–15.9)
LYMPH#: 1.8 10*3/uL (ref 0.9–3.3)
LYMPH%: 39.6 % (ref 14.0–49.7)
MCH: 21.8 pg — ABNORMAL LOW (ref 25.1–34.0)
MCHC: 31.1 g/dL — ABNORMAL LOW (ref 31.5–36.0)
MCV: 70.2 fL — ABNORMAL LOW (ref 79.5–101.0)
MONO#: 0.5 10*3/uL (ref 0.1–0.9)
MONO%: 10.1 % (ref 0.0–14.0)
NEUT%: 49 % (ref 38.4–76.8)
NEUTROS ABS: 2.3 10*3/uL (ref 1.5–6.5)
PLATELETS: 221 10*3/uL (ref 145–400)
RBC: 5.72 10*6/uL — ABNORMAL HIGH (ref 3.70–5.45)
RDW: 16.1 % — ABNORMAL HIGH (ref 11.2–14.5)
WBC: 4.6 10*3/uL (ref 3.9–10.3)

## 2014-02-11 LAB — COMPREHENSIVE METABOLIC PANEL (CC13)
ALBUMIN: 3.4 g/dL — AB (ref 3.5–5.0)
ALT: 31 U/L (ref 0–55)
ANION GAP: 10 meq/L (ref 3–11)
AST: 29 U/L (ref 5–34)
Alkaline Phosphatase: 76 U/L (ref 40–150)
BUN: 9.9 mg/dL (ref 7.0–26.0)
CHLORIDE: 107 meq/L (ref 98–109)
CO2: 26 mEq/L (ref 22–29)
Calcium: 9.7 mg/dL (ref 8.4–10.4)
Creatinine: 0.8 mg/dL (ref 0.6–1.1)
GLUCOSE: 100 mg/dL (ref 70–140)
POTASSIUM: 4 meq/L (ref 3.5–5.1)
Sodium: 143 mEq/L (ref 136–145)
Total Bilirubin: 1.1 mg/dL (ref 0.20–1.20)
Total Protein: 7.9 g/dL (ref 6.4–8.3)

## 2014-02-11 MED ORDER — SODIUM CHLORIDE 0.9 % IV SOLN
150.0000 mg | Freq: Once | INTRAVENOUS | Status: AC
  Administered 2014-02-11: 150 mg via INTRAVENOUS
  Filled 2014-02-11: qty 5

## 2014-02-11 MED ORDER — DOXORUBICIN HCL CHEMO IV INJECTION 2 MG/ML
60.0000 mg/m2 | Freq: Once | INTRAVENOUS | Status: AC
  Administered 2014-02-11: 120 mg via INTRAVENOUS
  Filled 2014-02-11: qty 60

## 2014-02-11 MED ORDER — SODIUM CHLORIDE 0.9 % IV SOLN
Freq: Once | INTRAVENOUS | Status: AC
  Administered 2014-02-11: 13:00:00 via INTRAVENOUS

## 2014-02-11 MED ORDER — PALONOSETRON HCL INJECTION 0.25 MG/5ML
0.2500 mg | Freq: Once | INTRAVENOUS | Status: AC
  Administered 2014-02-11: 0.25 mg via INTRAVENOUS

## 2014-02-11 MED ORDER — DEXAMETHASONE SODIUM PHOSPHATE 20 MG/5ML IJ SOLN
12.0000 mg | Freq: Once | INTRAMUSCULAR | Status: AC
  Administered 2014-02-11: 12 mg via INTRAVENOUS

## 2014-02-11 MED ORDER — PALONOSETRON HCL INJECTION 0.25 MG/5ML
INTRAVENOUS | Status: AC
  Filled 2014-02-11: qty 5

## 2014-02-11 MED ORDER — CYCLOPHOSPHAMIDE CHEMO INJECTION 1 GM
600.0000 mg/m2 | Freq: Once | INTRAMUSCULAR | Status: AC
  Administered 2014-02-11: 1200 mg via INTRAVENOUS
  Filled 2014-02-11: qty 60

## 2014-02-11 MED ORDER — DEXAMETHASONE SODIUM PHOSPHATE 20 MG/5ML IJ SOLN
INTRAMUSCULAR | Status: AC
  Filled 2014-02-11: qty 5

## 2014-02-11 MED ORDER — SODIUM CHLORIDE 0.9 % IJ SOLN
10.0000 mL | INTRAMUSCULAR | Status: DC | PRN
  Filled 2014-02-11: qty 10

## 2014-02-11 MED ORDER — HEPARIN SOD (PORK) LOCK FLUSH 100 UNIT/ML IV SOLN
500.0000 [IU] | Freq: Once | INTRAVENOUS | Status: DC | PRN
  Filled 2014-02-11: qty 5

## 2014-02-11 NOTE — Telephone Encounter (Signed)
per pt to chge inj time for 5/20-chged to 11:00-pt has new sch

## 2014-02-11 NOTE — Patient Instructions (Signed)
[facility] Discharge Instructions for Patients Receiving Chemotherapy  Today you received the following chemotherapy agents: Adriamycin/Cytoxan  To help prevent nausea and vomiting after your treatment, we encourage you to take your nausea medication Zofran   If you develop nausea and vomiting that is not controlled by your nausea medication, call the clinic.   BELOW ARE SYMPTOMS THAT SHOULD BE REPORTED IMMEDIATELY:  *FEVER GREATER THAN 100.5 F  *CHILLS WITH OR WITHOUT FEVER  NAUSEA AND VOMITING THAT IS NOT CONTROLLED WITH YOUR NAUSEA MEDICATION  *UNUSUAL SHORTNESS OF BREATH  *UNUSUAL BRUISING OR BLEEDING  TENDERNESS IN MOUTH AND THROAT WITH OR WITHOUT PRESENCE OF ULCERS  *URINARY PROBLEMS  *BOWEL PROBLEMS  UNUSUAL RASH Items with * indicate a potential emergency and should be followed up as soon as possible.  Feel free to call the clinic you have any questions or concerns. The clinic phone number is [phone].   Doxorubicin injection What is this medicine? DOXORUBICIN (dox oh ROO bi sin) is a chemotherapy drug. It is used to treat many kinds of cancer like Hodgkin's disease, leukemia, non-Hodgkin's lymphoma, neuroblastoma, sarcoma, and Wilms' tumor. It is also used to treat bladder cancer, breast cancer, lung cancer, ovarian cancer, stomach cancer, and thyroid cancer. This medicine may be used for other purposes; ask your health care provider or pharmacist if you have questions. COMMON BRAND NAME(S): Adriamycin PFS, Adriamycin RDF, Adriamycin, Rubex What should I tell my health care provider before I take this medicine? They need to know if you have any of these conditions: -blood disorders -heart disease, recent heart attack -infection (especially a virus infection such as chickenpox, cold sores, or herpes) -irregular heartbeat -liver disease -recent or ongoing radiation therapy -an unusual or allergic reaction to doxorubicin, other chemotherapy  agents, other medicines, foods, dyes, or preservatives -pregnant or trying to get pregnant -breast-feeding How should I use this medicine? This drug is given as an infusion into a vein. It is administered in a hospital or clinic by a specially trained health care professional. If you have pain, swelling, burning or any unusual feeling around the site of your injection, tell your health care professional right away. Talk to your pediatrician regarding the use of this medicine in children. Special care may be needed. Overdosage: If you think you have taken too much of this medicine contact a poison control center or emergency room at once. NOTE: This medicine is only for you. Do not share this medicine with others. What if I miss a dose? It is important not to miss your dose. Call your doctor or health care professional if you are unable to keep an appointment. What may interact with this medicine? Do not take this medicine with any of the following medications: -cisapride -droperidol -halofantrine -pimozide -zidovudine This medicine may also interact with the following medications: -chloroquine -chlorpromazine -clarithromycin -cyclophosphamide -cyclosporine -erythromycin -medicines for depression, anxiety, or psychotic disturbances -medicines for irregular heart beat like amiodarone, bepridil, dofetilide, encainide, flecainide, propafenone, quinidine -medicines for seizures like ethotoin, fosphenytoin, phenytoin -medicines for nausea, vomiting like dolasetron, ondansetron, palonosetron -medicines to increase blood counts like filgrastim, pegfilgrastim, sargramostim -methadone -methotrexate -pentamidine -progesterone -vaccines -verapamil Talk to your doctor or health care professional before taking any of these medicines: -acetaminophen -aspirin -ibuprofen -ketoprofen -naproxen This list may not describe all possible interactions. Give your health care provider a list of all  the medicines, herbs, non-prescription drugs, or dietary supplements you use. Also tell them if you smoke, drink alcohol, or use illegal  drugs. Some items may interact with your medicine. What should I watch for while using this medicine? Your condition will be monitored carefully while you are receiving this medicine. You will need important blood work done while you are taking this medicine. This drug may make you feel generally unwell. This is not uncommon, as chemotherapy can affect healthy cells as well as cancer cells. Report any side effects. Continue your course of treatment even though you feel ill unless your doctor tells you to stop. Your urine may turn red for a few days after your dose. This is not blood. If your urine is dark or brown, call your doctor. In some cases, you may be given additional medicines to help with side effects. Follow all directions for their use. Call your doctor or health care professional for advice if you get a fever, chills or sore throat, or other symptoms of a cold or flu. Do not treat yourself. This drug decreases your body's ability to fight infections. Try to avoid being around people who are sick. This medicine may increase your risk to bruise or bleed. Call your doctor or health care professional if you notice any unusual bleeding. Be careful brushing and flossing your teeth or using a toothpick because you may get an infection or bleed more easily. If you have any dental work done, tell your dentist you are receiving this medicine. Avoid taking products that contain aspirin, acetaminophen, ibuprofen, naproxen, or ketoprofen unless instructed by your doctor. These medicines may hide a fever. Men and women of childbearing age should use effective birth control methods while using taking this medicine. Do not become pregnant while taking this medicine. There is a potential for serious side effects to an unborn child. Talk to your health care professional or  pharmacist for more information. Do not breast-feed an infant while taking this medicine. Do not let others touch your urine or other body fluids for 5 days after each treatment with this medicine. Caregivers should wear latex gloves to avoid touching body fluids during this time. There is a maximum amount of this medicine you should receive throughout your life. The amount depends on the medical condition being treated and your overall health. Your doctor will watch how much of this medicine you receive in your lifetime. Tell your doctor if you have taken this medicine before. What side effects may I notice from receiving this medicine? Side effects that you should report to your doctor or health care professional as soon as possible: -allergic reactions like skin rash, itching or hives, swelling of the face, lips, or tongue -low blood counts - this medicine may decrease the number of white blood cells, red blood cells and platelets. You may be at increased risk for infections and bleeding. -signs of infection - fever or chills, cough, sore throat, pain or difficulty passing urine -signs of decreased platelets or bleeding - bruising, pinpoint red spots on the skin, black, tarry stools, blood in the urine -signs of decreased red blood cells - unusually weak or tired, fainting spells, lightheadedness -breathing problems -chest pain -fast, irregular heartbeat -mouth sores -nausea, vomiting -pain, swelling, redness at site where injected -pain, tingling, numbness in the hands or feet -swelling of ankles, feet, or hands -unusual bleeding or bruising Side effects that usually do not require medical attention (report to your doctor or health care professional if they continue or are bothersome): -diarrhea -facial flushing -hair loss -loss of appetite -missed menstrual periods -nail discoloration or damage -red or  watery eyes -red colored urine -stomach upset This list may not describe all  possible side effects. Call your doctor for medical advice about side effects. You may report side effects to FDA at 1-800-FDA-1088. Where should I keep my medicine? This drug is given in a hospital or clinic and will not be stored at home. NOTE: This sheet is a summary. It may not cover all possible information. If you have questions about this medicine, talk to your doctor, pharmacist, or health care provider.  2014, [employer]. (2013-01-08 09:54:34)  Cyclophosphamide injection What is this medicine? CYCLOPHOSPHAMIDE (sye kloe FOSS fa mide) is a chemotherapy drug. It slows the growth of cancer cells. This medicine is used to treat many types of cancer like lymphoma, myeloma, leukemia, breast cancer, and ovarian cancer, to name a few. This medicine may be used for other purposes; ask your health care provider or pharmacist if you have questions. COMMON BRAND NAME(S): Cytoxan, Neosar What should I tell my health care provider before I take this medicine? They need to know if you have any of these conditions: -blood disorders -history of other chemotherapy -infection -kidney disease -liver disease -recent or ongoing radiation therapy -tumors in the bone marrow -an unusual or allergic reaction to cyclophosphamide, other chemotherapy, other medicines, foods, dyes, or preservatives -pregnant or trying to get pregnant -breast-feeding How should I use this medicine? This drug is usually given as an injection into a vein or muscle or by infusion into a vein. It is administered in a hospital or clinic by a specially trained health care professional. Talk to your pediatrician regarding the use of this medicine in children. Special care may be needed. Overdosage: If you think you have taken too much of this medicine contact a poison control center or emergency room at once. NOTE: This medicine is only for you. Do not share this medicine with others. What if I miss a dose? It is  important not to miss your dose. Call your doctor or health care professional if you are unable to keep an appointment. What may interact with this medicine? This medicine may interact with the following medications: -amiodarone -amphotericin B -azathioprine -certain antiviral medicines for HIV or AIDS such as protease inhibitors (e.g., indinavir, ritonavir) and zidovudine -certain blood pressure medications such as benazepril, captopril, enalapril, fosinopril, lisinopril, moexipril, monopril, perindopril, quinapril, ramipril, trandolapril -certain cancer medications such as anthracyclines (e.g., daunorubicin, doxorubicin), busulfan, cytarabine, paclitaxel, pentostatin, tamoxifen, trastuzumab -certain diuretics such as chlorothiazide, chlorthalidone, hydrochlorothiazide, indapamide, metolazone -certain medicines that treat or prevent blood clots like warfarin -certain muscle relaxants such as succinylcholine -cyclosporine -etanercept -indomethacin -medicines to increase blood counts like filgrastim, pegfilgrastim, sargramostim -medicines used as general anesthesia -metronidazole -natalizumab This list may not describe all possible interactions. Give your health care provider a list of all the medicines, herbs, non-prescription drugs, or dietary supplements you use. Also tell them if you smoke, drink alcohol, or use illegal drugs. Some items may interact with your medicine. What should I watch for while using this medicine? Visit your doctor for checks on your progress. This drug may make you feel generally unwell. This is not uncommon, as chemotherapy can affect healthy cells as well as cancer cells. Report any side effects. Continue your course of treatment even though you feel ill unless your doctor tells you to stop. Drink water or other fluids as directed. Urinate often, even at night. In some cases, you may be given additional medicines to help with side effects. Follow all directions for  their use. Call your doctor or health care professional for advice if you get a fever, chills or sore throat, or other symptoms of a cold or flu. Do not treat yourself. This drug decreases your body's ability to fight infections. Try to avoid being around people who are sick. This medicine may increase your risk to bruise or bleed. Call your doctor or health care professional if you notice any unusual bleeding. Be careful brushing and flossing your teeth or using a toothpick because you may get an infection or bleed more easily. If you have any dental work done, tell your dentist you are receiving this medicine. You may get drowsy or dizzy. Do not drive, use machinery, or do anything that needs mental alertness until you know how this medicine affects you. Do not become pregnant while taking this medicine or for 1 year after stopping it. Women should inform their doctor if they wish to become pregnant or think they might be pregnant. Men should not father a child while taking this medicine and for 4 months after stopping it. There is a potential for serious side effects to an unborn child. Talk to your health care professional or pharmacist for more information. Do not breast-feed an infant while taking this medicine. This medicine may interfere with the ability to have a child. This medicine has caused ovarian failure in some women. This medicine has caused reduced sperm counts in some men. You should talk with your doctor or health care professional if you are concerned about your fertility. If you are going to have surgery, tell your doctor or health care professional that you have taken this medicine. What side effects may I notice from receiving this medicine? Side effects that you should report to your doctor or health care professional as soon as possible: -allergic reactions like skin rash, itching or hives, swelling of the face, lips, or tongue -low blood counts - this medicine may decrease the  number of white blood cells, red blood cells and platelets. You may be at increased risk for infections and bleeding. -signs of infection - fever or chills, cough, sore throat, pain or difficulty passing urine -signs of decreased platelets or bleeding - bruising, pinpoint red spots on the skin, black, tarry stools, blood in the urine -signs of decreased red blood cells - unusually weak or tired, fainting spells, lightheadedness -breathing problems -dark urine -dizziness -palpitations -swelling of the ankles, feet, hands -trouble passing urine or change in the amount of urine -weight gain -yellowing of the eyes or skin Side effects that usually do not require medical attention (report to your doctor or health care professional if they continue or are bothersome): -changes in nail or skin color -hair loss -missed menstrual periods -mouth sores -nausea, vomiting This list may not describe all possible side effects. Call your doctor for medical advice about side effects. You may report side effects to FDA at 1-800-FDA-1088. Where should I keep my medicine? This drug is given in a hospital or clinic and will not be stored at home. NOTE: This sheet is a summary. It may not cover all possible information. If you have questions about this medicine, talk to your doctor, pharmacist, or health care provider.  2014, [employer]. (2012-07-27 16:22:58)

## 2014-02-11 NOTE — Progress Notes (Addendum)
[facility]  [phone] [phone]     ID: Rhonda [name] OB: 19-Jun-1971  MR#: [id number]  YTK#:[id number]  PCP: Rhonda Coop, PA-C GYN:  Rhonda Bellman, MD SU: Rhonda Seltzer, MD OTHER MD: Rhonda Koh, MD  CHIEF COMPLAINT: Right breast cancer/neoadjuvant chemotherapy   BREAST CANCER HISTORY: Rhonda [name] was visiting relatives in [place] about 4 months ago when she noted pain in her right breast. In 2003 she had a right breast infection which felt pretty much the same. She was treated with antibiotics and then (while living in [place]). Accordingly this time she minimized the symptoms. It felt like milk was coming down she says. She also had breast pain while having her period. Then in February she actually found 2 lumps in her right breast breast which she initially ignored. Eventually as the symptoms seemed to progress she saw a Dr. In [place] and he told her she likely had breast cancer. She waited until coming back to the states to seek definitive care.  On 12/17/2013 she had bilateral diagnostic mammography in [facility]). In the upper outer right breast there were pleomorphic calcifications measuring approximately 9 cm in diameter. There were 4 distinct microlobulated masses in the right breast, 3 of them in the upper outer quadrant one in the lower outer quadrant. Each measures approximately 1.2 cm. Ultrasound showed a complex cystic mass in the superior central right breast, a 4 mm hypoechoic lesion, 3 suspicious solid masses elsewhere in the right breast, all of them hypoechoic and oval are round with increased vascularity. The largest of these masses measure 1.7 cm. Findings in the left breast were not suspicious or specific. The patient was referred to the breast Center where on 01/20/2014 she underwent biopsy of 2 of the right breast masses, and a right axillary lymph node. 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  On 01/28/2014 the patient underwent bilateral breast MRI. This showed, in the right breast, multiple irregular enhancing masses in the setting of clumped non-masslike enhancement the area in question measured up to 9.4 cm. There was diffuse asymmetric skin thickening and edema involving the right breast, but no obvious nipple or pectoralis involvement. There were several enlarged and morphologically abnormal right axillary lymph node as well as inter-pectoral and subpectoral adenopathy there the largest right axillary lymph node measured 2.7 cm. There was no internal mammary lymphadenopathy noted. The left breast was unremarkable.  Her subsequent history is as detailed below.  INTERVAL HISTORY: Rhonda [name] returns alone today for followup of her locally advanced right breast carcinoma. She is scheduled to initiate her neoadjuvant chemotherapy, and is actually due for day 1 cycle 1 of her  4 planned dose dense cycles of doxorubicin/cyclophosphamide today.   Rhonda [name] has a lot of things going on, including some social/family situations, and was unsure whether or not she was ready to initiate therapy today. It does "makes her nervous", however, to delay her neoadjuvant therapy.  I will mention that she had a staging CT scan yesterday which showed new extensive right-sided mass, with bulky right axillary and subpectoral adenopathy. This is detailed below.  Her insurance would not approve a PET scan until after the chest CT had been obtained, and the PET is currently scheduled for later this month.  Rhonda [name] had requested a consultation with regards to fertility prior to beginning  chemotherapy. She had an appointment last week, but unfortunately had to miss the appointment because she did not have a babysitter. She has decided not to reschedule that appointment, and to "go  ahead with treatment". She does understand there is a possibility that she will become postmenopausal following chemotherapy.  Perhaps most significantly today is the fact that Rhonda [name]'s 2 older children, ages 65 and 71, are currently in [place] with family. Rhonda [name]'s husband is currently not with the children.  He is a Optometrist and is in [place]  She is very concerned about the children and this makes her very anxious. She would very much like to have them back home in [place] (They are Rhonda [name].), and is torn between beginning chemotherapy today or flying to [place] to pick up the children and delaying treatment for another 2 weeks. This has been very hard decision for her to make.   REVIEW OF SYSTEMS: Physically, [name] is feeling reasonably well. She's had no fevers or chills. She denies any rashes or skin changes. She's had no abnormal bruising or bleeding. Her energy level is fair, as is her appetite. She has a history of reflux and has not yet started on her omeprazole. She sometimes feels like it is difficult to swallow. She has occasional nausea, but no emesis, and she denies any recent change in bowel or bladder habits. She's had no increased cough, shortness of breath, chest pain, or palpitations. She has some occasional headaches, but no dizziness or change in vision. She currently denies any unusual myalgias, arthralgias, or bony pain. She had her port placed last week, and has had no significant pain or complications.   A detailed review of systems is otherwise stable and noncontributory.    PAST MEDICAL HISTORY: Past Medical History  Diagnosis Date  . Hypertension   . Blood transfusion without reported diagnosis     3 previous   . Anemia   . Ulcer     hx of stomach ulcer a long time ago  . GERD (gastroesophageal reflux disease)     a long time ago  . Headache(784.0)   . Cancer 10/2013    right breast cancer    PAST SURGICAL HISTORY: Past Surgical History    Procedure Laterality Date  . Cesarean section      2 previous  . Back surgery  2000  . Appendectomy      a long time ago in [place]  . Portacath placement N/A 02/06/2014    Procedure: INSERTION PORT-A-CATH;  Surgeon: Edward Jolly, MD;  Location: [facility];  Service: General;  Laterality: N/A;    FAMILY HISTORY Family History  Problem Relation Age of Onset  . Hypertension Mother   . Hypertension Father    the patient's parents are living but she is not sure of their age. She had 4 brothers and 4 sisters. 2 other brothers died in the turmoil [place] and one brother is currently in [place] but they do not know where are even if he is still alive. She has one brother in this area. There is no history of breast or ovarian cancer in the family to her knowledge.  GYNECOLOGIC HISTORY: (Reviewed 02/11/2014) Menarche somewhere between the ages of 85 and 24. First live birth age 55. The patient is GX P3. Her periods are now irregular. She took birth control pills remotely for approximately one year with no complaints.  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 Her husband Jacquenette Shone  Lol Dalene Carrow is a former Emergency planning/management officer to the [place] from [place] and is a Optometrist. Their children are Rhonda [name] (11),Rhonda [name] (8) and Rhonda [name] (4). The patient attends a [employer] locally    ADVANCED DIRECTIVES: Not in place   HEALTH MAINTENANCE: (Updated 02/11/2014) History  Substance Use Topics  . Smoking status: Never Smoker   . Smokeless tobacco: Never Used  . Alcohol Use: No     Colonoscopy: Never  PAP: April 2015/Dr. Constant  Bone density: Never  Lipid panel: Not on file  No Known Allergies  Current Outpatient Prescriptions  Medication Sig Dispense Refill  . acetaminophen (TYLENOL) 325 MG tablet Take 650 mg by mouth every 6 (six) hours as needed for mild pain or headache.       . baclofen (LIORESAL) 10 MG tablet Take 10 mg by mouth every morning.       .  cholecalciferol (VITAMIN D) 1000 UNITS tablet Take 1,000 Units by mouth daily.      Marland Kitchen dexamethasone (DECADRON) 4 MG tablet 2 tabs by mouth with food twice daily on day before and 3 days after each chemo  60 tablet  1  . HYDROcodone-acetaminophen (NORCO/VICODIN) 5-325 MG per tablet Take 1-2 tablets by mouth every 4 (four) hours as needed.  30 tablet  0  . lidocaine-prilocaine (EMLA) cream Apply to port 1 -2 hr before each procedure/port access as directed  30 g  6  . LORazepam (ATIVAN) 0.5 MG tablet Take 1 tablet (0.5 mg total) by mouth at bedtime as needed for anxiety or sleep (Nausea or vomiting).  30 tablet  0  . omeprazole (PRILOSEC) 40 MG capsule Take 1 capsule (40 mg total) by mouth daily.  30 capsule  6  . ondansetron (ZOFRAN) 8 MG tablet Take 1 tablet (8 mg total) by mouth 2 (two) times daily as needed for nausea or vomiting. Start on the third day after chemotherapy.  30 tablet  1  . Prenatal Vit-Fe Fumarate-FA (PRENATAL VITAMINS) 28-0.8 MG TABS Take 1 tablet by mouth daily.       . prochlorperazine (COMPAZINE) 10 MG tablet 1 tab by mouth with meals and bedtime x 3 days after chemo, then 1 tab by mouth every 6 hrs if needed for nausea  30 tablet  2  . hydrochlorothiazide (MICROZIDE) 12.5 MG capsule Take 12.5 mg by mouth daily as needed (headache).        No current facility-administered medications for this visit.   Facility-Administered Medications Ordered in Other Visits  Medication Dose Route Frequency Provider Last Rate Last Dose  . cyclophosphamide (CYTOXAN) 1,200 mg in sodium chloride 0.9 % 250 mL chemo infusion  600 mg/m2 (Order-Specific) Intravenous Once Theotis Burrow, PA-C 620 mL/hr at 02/11/14 1439 1,200 mg at 02/11/14 1439  . heparin lock flush 100 unit/mL  500 Units Intracatheter Once PRN Mayukha Symmonds Milda Smart, PA-C      . sodium chloride 0.9 % injection 10 mL  10 mL Intracatheter PRN Michie Molnar Milda Smart, PA-C        OBJECTIVE: African woman who appears comfortable but slightly anxious Filed  Vitals:   02/11/14 1119  BP: 128/83  Pulse: 71  Temp: 98.3 F (36.8 C)  Resp: 18     Body mass index is 26.86 kg/(m^2).    ECOG FS: 0 Filed Weights   02/11/14 1119  Weight: 187 lb 3.2 oz (84.913 kg)   Physical Exam: HEENT:  Sclerae anicteric.  Oropharynx clear, pink, and moist. No ulcerations. No  candidiasis. Neck is supple, trachea midline. No thyromegaly.  NODES:  No cervical or supraclavicular lymphadenopathy palpated.  BREAST EXAM: There is a large palpable mass occupying the upper-outer quadrant of the right breast, firm but movable. There no significant skin changes or erythema. Left breast is unremarkable. There is palpable adenopathy in the right axilla. Left axilla is unremarkable. LUNGS:  Clear to auscultation bilaterally.  No wheezes or rhonchi HEART:  Regular rate and rhythm. No murmur appreciated. ABDOMEN:  Soft, nontender. No organomegaly or masses palpated. Positive bowel sounds.  MSK:  No focal spinal tenderness to palpation. Full range of motion bilaterally in the upper extremities. EXTREMITIES:  No peripheral edema.  No clubbing or cyanosis. SKIN:  No visible rashes. No excessive ecchymoses. No petechiae. Good skin turgor. Port is intact in the left upper chest wall with well-healing incision. There is no erythema or edema, and no evidence of infection/cellulitis. NEURO:  Nonfocal. Well oriented.  Anxious affect.      LAB RESULTS:   Lab Results  Component Value Date   WBC 4.6 02/11/2014   NEUTROABS 2.3 02/11/2014   HGB 12.5 02/11/2014   HCT 40.2 02/11/2014   MCV 70.2* 02/11/2014   PLT 221 02/11/2014      Chemistry      Component Value Date/Time   NA 143 02/11/2014 1107   NA 144 01/27/2014 1630   K 4.0 02/11/2014 1107   K 3.8 01/27/2014 1630   CL 104 01/27/2014 1630   CO2 26 02/11/2014 1107   BUN 9.9 02/11/2014 1107   BUN 21 01/27/2014 1630   CREATININE 0.8 02/11/2014 1107   CREATININE 0.70 01/27/2014 1630      Component Value Date/Time   CALCIUM 9.7 02/11/2014  1107   ALKPHOS 76 02/11/2014 1107   AST 29 02/11/2014 1107   ALT 31 02/11/2014 1107   BILITOT 1.10 02/11/2014 1107      STUDIES:  CT Chest W Contrast 02/10/2014  CLINICAL DATA: Breast cancer.  EXAM:  CT CHEST WITH CONTRAST  TECHNIQUE:  Multidetector CT imaging of the chest was performed during  intravenous contrast administration.  CONTRAST: 47m OMNIPAQUE IOHEXOL 300 MG/ML SOLN  COMPARISON: Breast MRI 01/27/2014  FINDINGS:  Examination was chest wall demonstrates a left-sided Port-A-Cath in  position. No complicating features. The right breast demonstrates  marked skin thickening and diffuse sub areolar tumor with multiple  nodules or intramammary lymph nodes. There is also bulky axillary  and subpectoral adenopathy. The largest axillary lymph node on image  number 19 measures 29 x 16.5 mm.  The bony thorax is intact. No destructive or sclerotic bone lesions.  The heart is normal in size. No pericardial effusion. No mediastinal  or hilar mass or lymphadenopathy. The aorta is normal in caliber.  The esophagus is grossly normal.  Examination of the lung parenchyma demonstrates no acute pulmonary  findings. No worrisome pulmonary lesions to suggest pulmonary  metastatic disease. No pleural effusion.  The upper abdomen is unremarkable.  IMPRESSION:  1. Extensive right-sided breast disease as discussed above and as  seen on the prior breast MRI. There is bulky right axillary and  subpectoral adenopathy.  2. No findings for pulmonary or osseous metastatic disease.  Electronically Signed  By: MKalman JewelsM.D.  On: 02/10/2014 15:47    Mr Breast Bilateral W Wo Contrast 01/28/2014   CLINICAL DATA:  43year old female with recently diagnosed invasive ductal carcinoma in the right breast (2 sites of malignancy biopsied which span 10 cm apart) as well  as axillary lymph node metastases.  LABS:  Most recent serum creatinine 0.7 milligrams/deciliter.  EXAM: BILATERAL BREAST MRI WITH AND  WITHOUT CONTRAST  TECHNIQUE: Multiplanar, multisequence MR images of both breasts were obtained prior to and following the intravenous administration of 36m of MultiHance.  THREE-DIMENSIONAL MR IMAGE RENDERING ON INDEPENDENT WORKSTATION:  Three-dimensional MR images were rendered by post-processing of the original MR data on an independent workstation. The three-dimensional MR images were interpreted, and findings are reported in the following complete MRI report for this study. Three dimensional images were evaluated at the independent DynaCad workstation  COMPARISON:  Previous exams  FINDINGS: Breast composition: c:  Heterogeneous fibroglandular tissue  Background parenchymal enhancement: Moderate  Right breast: Multiple irregular enhancing masses with associated contiguous and surrounding areas of clumped non mass enhancement are seen throughout the central, outer, and upper-outer right breast compatible with known biopsy proven malignancy. This measures up to 9.4 cm in greatest AP dimension, approximately 5 cm transverse, and up to 7 cm craniocaudal. There is diffuse asymmetric skin thickening as well as edema involving the right breast. No obvious nipple involvement. No pectoralis involvement.  Left breast: No suspicious rapidly enhancing masses or abnormal areas of enhancement in the left breast to suggest malignancy. A small oval circumscribed mass in the upper-outer left breast measuring 0.5 x 0.6 x 0.6 cm corresponds with a benign appearing lymph node seen on the recent outside ultrasound.  Lymph nodes: Several enlarged and morphologically abnormal right axillary, interpectoral, and subpectoral lymph nodes are identified compatible with known biopsy proven axillary metastases, with a representative enlarged right axillary lymph node measuring up to 2.7 cm AP. No internal mammary lymphadenopathy seen.  Ancillary findings:  None.  IMPRESSION: Extensive malignancy involving predominately the central, outer,  and upper-outer right breast measuring up to 9.4 cm in greatest AP dimension with associated right axillary (biopsy proven), interpectoral, and subpectoral lymphadenopathy.  RECOMMENDATION: Treatment for known biopsy proven malignancy in the right breast.  BI-RADS CATEGORY  6: Known biopsy-proven malignancy.   Electronically Signed   By: JEverlean AlstromM.D.   On: 01/28/2014 09:25       ASSESSMENT: 43y.o. [place]  (1)   status post right breast and right axillary lymph node biopsy 01/20/2014, both positive for a clinically T3 N1-2, stage III a invasive ductal carcinoma, grade 2, estrogen and progesterone receptor negative, HER-2 positive, with an MIB-1 of 85%  (2)  to be treated in the neoadjuvant setting, the plan being to proceed with 4 dose dense cycles of doxorubicin/cyclophosphamide (first treatment 02/11/2014), with Neulasta on day 2 for granulocyte support. This is to be followed by weekly paclitaxel x12, given along with trastuzumab/pertuzumab every 3 weeks prior to definitive surgery.   (3)  Patient will likely need a right modified radical mastectomy, followed by postmastectomy radiation.   (4)  trastuzumab will be continued every 3 weeks for total of one year.  PLAN: The majority of our 40 minute appointment today was spent reviewing the patient's diagnosis, answering her questions and addressing her concerns, and counseling her with regards to her treatment plan and her family situation. Dr. MJana Hakimalso spoke with the patient today regarding her care.   After much discussion, RMarticiahas decided to proceed with treatment today as scheduled, for her first neoadjuvant dose of dose dense doxorubicin/cyclophosphamide. She will return tomorrow for her Neulasta injection, and I will plan on seeing her next week on may 26 for assessment of chemotoxicity.  She has all of her antinausea medications, and we again reviewed how to utilize all of them  appropriately. She was able to voiced her understanding.  Per our conversation today, she is going to encourage her husband to bring their 2 older children home over the next 2 weeks. If they are not home 2 weeks from now when she is due for cycle 2 (02/25/2014), we will likely hold that dose for an extra week, allowing her to travel to [place] to get the children between June 2 and June 9.  This allows [place] to go ahead and begin treatment today without delay. Waiting 2 weeks would also give Onelia time to recover from cycle 1, and we would only have to delay approximately one week between June 2 and June 9 before proceeding with cycle 2.    All of the above was reviewed in detail with Apolonio Schneiders today.  This plan seemed reasonable to [facility], and she is comfortable with this plan as described.  She does know to call prior to her appointment here next week with any changes or problems.     Theotis Burrow, PA-C   02/11/2014 2:54 PM  ADDENDUM:  I met with the patient today to help her decide whether she should postpone her treatment further, so she could bring her children home from [place], or go ahead with chemotherapy and have her husband bring them. Her husband has advised her indeed to start her chemotherapy and has promised to bring the children.  This is a very personal decision. In my view, however, it would be best for the patient to go ahead and start her therapy. If there is some delay bringing the children and her going to [place] in my accelerate that process, we can always postpone 1 treatment cycle for one week to give her time to accomplish that.  After much discussion dissolution seen best Sacha and accordingly we are proceeding with therapy now.  I personally saw this patient and performed a substantive portion of this encounter with the listed APP documented above.   Chauncey Cruel, MD

## 2014-02-12 ENCOUNTER — Telehealth: Payer: Self-pay | Admitting: *Deleted

## 2014-02-12 ENCOUNTER — Ambulatory Visit (HOSPITAL_BASED_OUTPATIENT_CLINIC_OR_DEPARTMENT_OTHER): Payer: Medicaid Other

## 2014-02-12 ENCOUNTER — Ambulatory Visit: Payer: Medicaid Other

## 2014-02-12 VITALS — BP 142/88 | HR 69 | Temp 98.1°F

## 2014-02-12 DIAGNOSIS — C50419 Malignant neoplasm of upper-outer quadrant of unspecified female breast: Secondary | ICD-10-CM

## 2014-02-12 DIAGNOSIS — Z5189 Encounter for other specified aftercare: Secondary | ICD-10-CM

## 2014-02-12 DIAGNOSIS — C50411 Malignant neoplasm of upper-outer quadrant of right female breast: Secondary | ICD-10-CM

## 2014-02-12 DIAGNOSIS — C773 Secondary and unspecified malignant neoplasm of axilla and upper limb lymph nodes: Secondary | ICD-10-CM

## 2014-02-12 MED ORDER — PEGFILGRASTIM INJECTION 6 MG/0.6ML
6.0000 mg | Freq: Once | SUBCUTANEOUS | Status: AC
  Administered 2014-02-12: 6 mg via SUBCUTANEOUS
  Filled 2014-02-12: qty 0.6

## 2014-02-12 NOTE — Telephone Encounter (Signed)
Rhonda [name] here for Neulasta injection following 1st a/c chemo treatment.  States that she isn't feeling well.  She has been nauseated since she started to eat last evening.  She has taken Compazine and Ativan for nausea as well as the Dexamethasone this morning.  She has not vomited.   Today she feels like the nausea is leaving.  Encouraged her to call if the nausea gets worse.  She is drinking fluids and eating a bland diet.  All questions answered.  Knows to call if she has any problems or concerns.

## 2014-02-12 NOTE — Patient Instructions (Signed)
Call if temperature is 100.5.  Call if nausea does not go away.  Drink extra fluids and eat light foods.  Pegfilgrastim injection What is this medicine? PEGFILGRASTIM (peg fil GRA stim) helps the body make more white blood cells. It is used to prevent infection in people with low amounts of white blood cells following cancer treatment. This medicine may be used for other purposes; ask your health care provider or pharmacist if you have questions. COMMON BRAND NAME(S): Neulasta What should I tell my health care provider before I take this medicine? They need to know if you have any of these conditions: -sickle cell disease -an unusual or allergic reaction to pegfilgrastim, filgrastim, E.coli protein, other medicines, foods, dyes, or preservatives -pregnant or trying to get pregnant -breast-feeding How should I use this medicine? This medicine is for injection under the skin. It is usually given by a health care professional in a hospital or clinic setting. If you get this medicine at home, you will be taught how to prepare and give this medicine. Do not shake this medicine. Use exactly as directed. Take your medicine at regular intervals. Do not take your medicine more often than directed. It is important that you put your used needles and syringes in a special sharps container. Do not put them in a trash can. If you do not have a sharps container, call your pharmacist or healthcare provider to get one. Talk to your pediatrician regarding the use of this medicine in children. While this drug may be prescribed for children who weigh more than 45 kg for selected conditions, precautions do apply Overdosage: If you think you have taken too much of this medicine contact a poison control center or emergency room at once. NOTE: This medicine is only for you. Do not share this medicine with others. What if I miss a dose? If you miss a dose, take it as soon as you can. If it is almost time for your next  dose, take only that dose. Do not take double or extra doses. What may interact with this medicine? -lithium -medicines for growth therapy This list may not describe all possible interactions. Give your health care provider a list of all the medicines, herbs, non-prescription drugs, or dietary supplements you use. Also tell them if you smoke, drink alcohol, or use illegal drugs. Some items may interact with your medicine. What should I watch for while using this medicine? Visit your doctor for regular check ups. You will need important blood work done while you are taking this medicine. What side effects may I notice from receiving this medicine? Side effects that you should report to your doctor or health care professional as soon as possible: -allergic reactions like skin rash, itching or hives, swelling of the face, lips, or tongue -breathing problems -fever -pain, redness, or swelling where injected -shoulder pain -stomach or side pain Side effects that usually do not require medical attention (report to your doctor or health care professional if they continue or are bothersome): -aches, pains -headache -loss of appetite -nausea, vomiting -unusually tired This list may not describe all possible side effects. Call your doctor for medical advice about side effects. You may report side effects to FDA at 1-800-FDA-1088. Where should I keep my medicine? Keep out of the reach of children. Store in a refrigerator between 2 and 8 degrees C (36 and 46 degrees F). Do not freeze. Keep in carton to protect from light. Throw away this medicine if it is left  out of the refrigerator for more than 48 hours. Throw away any unused medicine after the expiration date. NOTE: This sheet is a summary. It may not cover all possible information. If you have questions about this medicine, talk to your doctor, pharmacist, or health care provider.  2014, [employer]. (2008-04-14 15:41:44)

## 2014-02-13 ENCOUNTER — Encounter: Payer: Self-pay | Admitting: Oncology

## 2014-02-17 ENCOUNTER — Emergency Department (HOSPITAL_COMMUNITY)
Admission: EM | Admit: 2014-02-17 | Discharge: 2014-02-17 | Disposition: A | Discharge status: Home / Self Care | Payer: Medicaid Other | Attending: Emergency Medicine | Admitting: Emergency Medicine

## 2014-02-17 ENCOUNTER — Emergency Department (HOSPITAL_COMMUNITY): Payer: Medicaid Other

## 2014-02-17 ENCOUNTER — Encounter (HOSPITAL_COMMUNITY): Payer: Self-pay | Admitting: Emergency Medicine

## 2014-02-17 DIAGNOSIS — T451X5A Adverse effect of antineoplastic and immunosuppressive drugs, initial encounter: Secondary | ICD-10-CM | POA: Insufficient documentation

## 2014-02-17 DIAGNOSIS — R5383 Other fatigue: Secondary | ICD-10-CM

## 2014-02-17 DIAGNOSIS — R51 Headache: Secondary | ICD-10-CM | POA: Insufficient documentation

## 2014-02-17 DIAGNOSIS — Z79899 Other long term (current) drug therapy: Secondary | ICD-10-CM | POA: Insufficient documentation

## 2014-02-17 DIAGNOSIS — M79609 Pain in unspecified limb: Secondary | ICD-10-CM | POA: Insufficient documentation

## 2014-02-17 DIAGNOSIS — R112 Nausea with vomiting, unspecified: Secondary | ICD-10-CM | POA: Insufficient documentation

## 2014-02-17 DIAGNOSIS — Z862 Personal history of diseases of the blood and blood-forming organs and certain disorders involving the immune mechanism: Secondary | ICD-10-CM | POA: Insufficient documentation

## 2014-02-17 DIAGNOSIS — C50919 Malignant neoplasm of unspecified site of unspecified female breast: Secondary | ICD-10-CM | POA: Insufficient documentation

## 2014-02-17 DIAGNOSIS — E860 Dehydration: Secondary | ICD-10-CM | POA: Insufficient documentation

## 2014-02-17 DIAGNOSIS — R110 Nausea: Secondary | ICD-10-CM

## 2014-02-17 DIAGNOSIS — R5381 Other malaise: Secondary | ICD-10-CM | POA: Insufficient documentation

## 2014-02-17 DIAGNOSIS — K219 Gastro-esophageal reflux disease without esophagitis: Secondary | ICD-10-CM | POA: Insufficient documentation

## 2014-02-17 DIAGNOSIS — I10 Essential (primary) hypertension: Secondary | ICD-10-CM | POA: Insufficient documentation

## 2014-02-17 DIAGNOSIS — IMO0002 Reserved for concepts with insufficient information to code with codable children: Secondary | ICD-10-CM | POA: Insufficient documentation

## 2014-02-17 LAB — CBC WITH DIFFERENTIAL/PLATELET
BASOS ABS: 0 10*3/uL (ref 0.0–0.1)
Basophils Relative: 1 % (ref 0–1)
Eosinophils Absolute: 0 10*3/uL (ref 0.0–0.7)
Eosinophils Relative: 1 % (ref 0–5)
HCT: 35.2 % — ABNORMAL LOW (ref 36.0–46.0)
Hemoglobin: 11.6 g/dL — ABNORMAL LOW (ref 12.0–15.0)
LYMPHS ABS: 1.2 10*3/uL (ref 0.7–4.0)
LYMPHS PCT: 48 % — AB (ref 12–46)
MCH: 22.5 pg — ABNORMAL LOW (ref 26.0–34.0)
MCHC: 33 g/dL (ref 30.0–36.0)
MCV: 68.3 fL — AB (ref 78.0–100.0)
MONOS PCT: 2 % — AB (ref 3–12)
Monocytes Absolute: 0 10*3/uL — ABNORMAL LOW (ref 0.1–1.0)
Neutro Abs: 1.2 10*3/uL — ABNORMAL LOW (ref 1.7–7.7)
Neutrophils Relative %: 48 % (ref 43–77)
PLATELETS: 126 10*3/uL — AB (ref 150–400)
RBC: 5.15 MIL/uL — ABNORMAL HIGH (ref 3.87–5.11)
RDW: 15.6 % — ABNORMAL HIGH (ref 11.5–15.5)
WBC: 2.4 10*3/uL — AB (ref 4.0–10.5)

## 2014-02-17 LAB — URINALYSIS, ROUTINE W REFLEX MICROSCOPIC
Bilirubin Urine: NEGATIVE
GLUCOSE, UA: NEGATIVE mg/dL
Hgb urine dipstick: NEGATIVE
Ketones, ur: NEGATIVE mg/dL
LEUKOCYTES UA: NEGATIVE
NITRITE: NEGATIVE
PH: 6.5 (ref 5.0–8.0)
Protein, ur: NEGATIVE mg/dL
SPECIFIC GRAVITY, URINE: 1.006 (ref 1.005–1.030)
Urobilinogen, UA: 0.2 mg/dL (ref 0.0–1.0)

## 2014-02-17 LAB — COMPREHENSIVE METABOLIC PANEL
ALBUMIN: 3 g/dL — AB (ref 3.5–5.2)
ALT: 19 U/L (ref 0–35)
AST: 14 U/L (ref 0–37)
Alkaline Phosphatase: 83 U/L (ref 39–117)
BUN: 16 mg/dL (ref 6–23)
CHLORIDE: 101 meq/L (ref 96–112)
CO2: 28 mEq/L (ref 19–32)
CREATININE: 0.78 mg/dL (ref 0.50–1.10)
Calcium: 9.3 mg/dL (ref 8.4–10.5)
GFR calc Af Amer: >90 mL/min (ref >90)
GFR calc non Af Amer: >90 mL/min (ref >90)
Glucose, Bld: 88 mg/dL (ref 70–99)
Potassium: 4.2 mEq/L (ref 3.7–5.3)
Sodium: 140 mEq/L (ref 137–147)
TOTAL PROTEIN: 7.1 g/dL (ref 6.0–8.3)
Total Bilirubin: 1.2 mg/dL (ref 0.3–1.2)

## 2014-02-17 LAB — I-STAT CG4 LACTIC ACID, ED: LACTIC ACID, VENOUS: 0.78 mmol/L (ref 0.5–2.2)

## 2014-02-17 MED ORDER — HEPARIN SOD (PORK) LOCK FLUSH 100 UNIT/ML IV SOLN
500.0000 [IU] | Freq: Once | INTRAVENOUS | Status: AC
  Administered 2014-02-17: 500 [IU]
  Filled 2014-02-17: qty 5

## 2014-02-17 MED ORDER — MORPHINE SULFATE 4 MG/ML IJ SOLN
4.0000 mg | Freq: Once | INTRAMUSCULAR | Status: AC
  Administered 2014-02-17: 4 mg via INTRAVENOUS
  Filled 2014-02-17: qty 1

## 2014-02-17 MED ORDER — ONDANSETRON HCL 4 MG/2ML IJ SOLN
4.0000 mg | Freq: Once | INTRAMUSCULAR | Status: AC
  Administered 2014-02-17: 4 mg via INTRAVENOUS
  Filled 2014-02-17: qty 2

## 2014-02-17 MED ORDER — SODIUM CHLORIDE 0.9 % IV BOLUS (SEPSIS)
1000.0000 mL | Freq: Once | INTRAVENOUS | Status: AC
  Administered 2014-02-17: 1000 mL via INTRAVENOUS

## 2014-02-17 NOTE — Discharge Instructions (Signed)
Stay hydrated.   Take your meds as prescribed.   Follow up with your doctor.   Return to ER if you have vomiting, dehydration, severe pain.

## 2014-02-17 NOTE — ED Notes (Addendum)
Pt is getting chemo for breast cancer, last tx was Thurs. Pt now c/o right arm pain, fever weakness, and headache since Saturday off and on. Pt also c/o nausea but states she is drinking but feels as if she is not.

## 2014-02-17 NOTE — ED Notes (Signed)
Pt reports "feels hot and feverish" x 2 days, constant R upper arm pain x 2 days.

## 2014-02-17 NOTE — ED Provider Notes (Signed)
CSN: [id number]     Arrival date & time 02/17/14  1357 History   First MD Initiated Contact with Patient 02/17/14 [phone]     Chief Complaint  Patient presents with  . cancer pt, arm pain   . Fever  . Weakness  . Headache     (Consider location/radiation/quality/duration/timing/severity/associated sxs/prior Treatment) The history is provided by the patient.  Rhonda [name] is a 43 y.o. female hx of HTN, stage 3 ductal carcinoma recently started on chemo here with weakness. Had first round of chemo 5 days ago. Then felt nauseated and had diffuse weakness. Had subjective fever but didn't check her temperature. Also diffuse muscle aches and headaches. She had right arm pain especially, but had axillary node surgery previously. Has nausea, no vomiting. No abdominal pain or urinary symptoms.    Past Medical History  Diagnosis Date  . Hypertension   . Blood transfusion without reported diagnosis     3 previous   . Anemia   . Ulcer     hx of stomach ulcer a long time ago  . GERD (gastroesophageal reflux disease)     a long time ago  . Headache(784.0)   . Cancer 10/2013    right breast cancer   Past Surgical History  Procedure Laterality Date  . Cesarean section      2 previous  . Back surgery  2000  . Appendectomy      a long time ago in [place]  . Portacath placement N/A 02/06/2014    Procedure: INSERTION PORT-A-CATH;  Surgeon: Edward Jolly, MD;  Location: [facility];  Service: General;  Laterality: N/A;   Family History  Problem Relation Age of Onset  . Hypertension Mother   . Hypertension Father    History  Substance Use Topics  . Smoking status: Never Smoker   . Smokeless tobacco: Never Used  . Alcohol Use: No   OB History   Grav Para Term Preterm Abortions TAB SAB Ect Mult Living   5 3 3  2  2   5      Review of Systems  Gastrointestinal: Positive for nausea.  Neurological: Positive for weakness.  All other systems reviewed and are negative.     Allergies   Review of patient's allergies indicates no known allergies.  Home Medications   Prior to Admission medications   Medication Sig Start Date End Date Taking? Authorizing Provider  acetaminophen (TYLENOL) 325 MG tablet Take 650 mg by mouth every 6 (six) hours as needed for mild pain or headache.    Yes Historical Provider, MD  baclofen (LIORESAL) 10 MG tablet Take 10 mg by mouth every morning.  01/07/14 01/07/15 Yes Historical Provider, MD  cholecalciferol (VITAMIN D) 1000 UNITS tablet Take 1,000 Units by mouth daily.   Yes Historical Provider, MD  dexamethasone (DECADRON) 4 MG tablet 2 tabs by mouth with food twice daily on day before and 3 days after each chemo 02/05/14  Yes Amy Milda Smart, PA-C  hydrochlorothiazide (MICROZIDE) 12.5 MG capsule Take 12.5 mg by mouth daily as needed (headache).  01/22/14 01/22/15 Yes Historical Provider, MD  HYDROcodone-acetaminophen (NORCO/VICODIN) 5-325 MG per tablet Take 1-2 tablets by mouth every 4 (four) hours as needed (pain.). 02/06/14  Yes Edward Jolly, MD  lidocaine-prilocaine (EMLA) cream Apply to port 1 -2 hr before each procedure/port access as directed 02/05/14  Yes Amy Milda Smart, PA-C  loratadine (CLARITIN) 10 MG tablet Take 10 mg by mouth daily.   Yes Historical Provider,  MD  LORazepam (ATIVAN) 0.5 MG tablet Take 1 tablet (0.5 mg total) by mouth at bedtime as needed for anxiety or sleep (Nausea or vomiting). 02/05/14  Yes Amy Milda Smart, PA-C  omeprazole (PRILOSEC) 40 MG capsule Take 1 capsule (40 mg total) by mouth daily. 02/05/14  Yes Amy Milda Smart, PA-C  ondansetron (ZOFRAN) 8 MG tablet Take 1 tablet (8 mg total) by mouth 2 (two) times daily as needed for nausea or vomiting. Start on the third day after chemotherapy. 02/05/14  Yes Amy Milda Smart, PA-C  prochlorperazine (COMPAZINE) 10 MG tablet 1 tab by mouth with meals and bedtime x 3 days after chemo, then 1 tab by mouth every 6 hrs if needed for nausea 02/05/14  Yes Amy G Berry, PA-C   BP 129/87  Pulse 64   Temp(Src) 98.5 F (36.9 C) (Oral)  Resp 20  SpO2 100%  LMP 11/02/2013 Physical Exam  Nursing note and vitals reviewed. Constitutional: She is oriented to person, place, and time.  Chronically ill, uncomfortable   HENT:  Head: Normocephalic.  MM slightly dry   Eyes: Conjunctivae are normal. Pupils are equal, round, and reactive to light.  Neck: Normal range of motion. Neck supple.  Cardiovascular: Normal rate, regular rhythm and normal heart sounds.   Pulmonary/Chest: Effort normal and breath sounds normal. No respiratory distress. She has no wheezes. She has no rales.  Abdominal: Soft. Bowel sounds are normal. She exhibits no distension. There is no tenderness. There is no rebound.  Musculoskeletal: Normal range of motion.  Neurological: She is alert and oriented to person, place, and time.  Nl strength throughout   Skin: Skin is warm and dry.  Psychiatric: She has a normal mood and affect. Her behavior is normal. Judgment and thought content normal.    ED Course  Procedures (including critical care time) Labs Review Labs Reviewed  CBC WITH DIFFERENTIAL - Abnormal; Notable for the following:    WBC 2.4 (*)    RBC 5.15 (*)    Hemoglobin 11.6 (*)    HCT 35.2 (*)    MCV 68.3 (*)    MCH 22.5 (*)    RDW 15.6 (*)    Platelets 126 (*)    Lymphocytes Relative 48 (*)    Monocytes Relative 2 (*)    Neutro Abs 1.2 (*)    Monocytes Absolute 0.0 (*)    All other components within normal limits  COMPREHENSIVE METABOLIC PANEL - Abnormal; Notable for the following:    Albumin 3.0 (*)    All other components within normal limits  URINALYSIS, ROUTINE W REFLEX MICROSCOPIC  I-STAT CG4 LACTIC ACID, ED    Imaging Review Dg Chest 2 View  02/17/2014   CLINICAL DATA:  History of breast cancer, right upper chest pain and shoulder pain  EXAM: CHEST  2 VIEW  COMPARISON:  CT scan of the chest 02/10/2014; prior chest x-ray 514 2015  FINDINGS: Left subclavian approach single-lumen port catheter.  There has been interval change in a the apparent to the catheter tip. The catheter tip may have migrated into the azygos vein. Cardiac and mediastinal contours are within normal limits. The lungs are clear. Visualized bowel gas pattern is unremarkable. No acute osseous abnormality or lesion.  IMPRESSION: 1. No active cardiopulmonary disease. 2. Interval change in configuration of the tip of the left subclavian approach single-lumen port a catheter. The catheter tip may have migrated into the azygos vein. This is likely not clinically significant would not account for the  patient's clinical symptoms.   Electronically Signed   By: Jacqulynn Cadet M.D.   On: 02/17/2014 15:54     EKG Interpretation None      MDM   Final diagnoses:  None   Rhonda [name] is a 43 y.o. female here with weakness, myalgias, nausea. Likely side effects of chemo. Will check labs, UA, CXR. Will hydrate and reassess.   6:30 PM Patient felt better. Labs showed some pan cytopenia. I called Dr. Inda Merlin who states that this is expected after chemo. She was hydrated and felt better. She has oncology f/u tomorrow.     Wandra Arthurs, MD 02/17/14 [phone]

## 2014-02-18 ENCOUNTER — Other Ambulatory Visit (HOSPITAL_BASED_OUTPATIENT_CLINIC_OR_DEPARTMENT_OTHER): Payer: Medicaid Other

## 2014-02-18 ENCOUNTER — Ambulatory Visit (HOSPITAL_BASED_OUTPATIENT_CLINIC_OR_DEPARTMENT_OTHER): Payer: Medicaid Other | Admitting: Physician Assistant

## 2014-02-18 ENCOUNTER — Encounter: Payer: Self-pay | Admitting: Physician Assistant

## 2014-02-18 VITALS — BP 133/88 | HR 61 | Temp 98.4°F | Resp 18 | Ht 70.0 in | Wt 186.0 lb

## 2014-02-18 DIAGNOSIS — C50411 Malignant neoplasm of upper-outer quadrant of right female breast: Secondary | ICD-10-CM

## 2014-02-18 DIAGNOSIS — D61818 Other pancytopenia: Secondary | ICD-10-CM

## 2014-02-18 DIAGNOSIS — T451X5A Adverse effect of antineoplastic and immunosuppressive drugs, initial encounter: Secondary | ICD-10-CM

## 2014-02-18 DIAGNOSIS — R110 Nausea: Secondary | ICD-10-CM

## 2014-02-18 DIAGNOSIS — C50919 Malignant neoplasm of unspecified site of unspecified female breast: Secondary | ICD-10-CM

## 2014-02-18 DIAGNOSIS — D701 Agranulocytosis secondary to cancer chemotherapy: Secondary | ICD-10-CM

## 2014-02-18 DIAGNOSIS — C50419 Malignant neoplasm of upper-outer quadrant of unspecified female breast: Secondary | ICD-10-CM

## 2014-02-18 DIAGNOSIS — C773 Secondary and unspecified malignant neoplasm of axilla and upper limb lymph nodes: Secondary | ICD-10-CM

## 2014-02-18 DIAGNOSIS — K219 Gastro-esophageal reflux disease without esophagitis: Secondary | ICD-10-CM

## 2014-02-18 DIAGNOSIS — B370 Candidal stomatitis: Secondary | ICD-10-CM

## 2014-02-18 LAB — COMPREHENSIVE METABOLIC PANEL (CC13)
ALBUMIN: 3.2 g/dL — AB (ref 3.5–5.0)
ALT: 46 U/L (ref 0–55)
ANION GAP: 7 meq/L (ref 3–11)
AST: 35 U/L — ABNORMAL HIGH (ref 5–34)
Alkaline Phosphatase: 75 U/L (ref 40–150)
BUN: 12.9 mg/dL (ref 7.0–26.0)
CALCIUM: 9.3 mg/dL (ref 8.4–10.4)
CO2: 27 meq/L (ref 22–29)
Chloride: 104 mEq/L (ref 98–109)
Creatinine: 0.7 mg/dL (ref 0.6–1.1)
GLUCOSE: 96 mg/dL (ref 70–140)
POTASSIUM: 4.1 meq/L (ref 3.5–5.1)
Sodium: 139 mEq/L (ref 136–145)
TOTAL PROTEIN: 7.3 g/dL (ref 6.4–8.3)
Total Bilirubin: 1.21 mg/dL — ABNORMAL HIGH (ref 0.20–1.20)

## 2014-02-18 LAB — CBC WITH DIFFERENTIAL/PLATELET
BASO%: 0.4 % (ref 0.0–2.0)
Basophils Absolute: 0 10*3/uL (ref 0.0–0.1)
EOS ABS: 0 10*3/uL (ref 0.0–0.5)
EOS%: 1.3 % (ref 0.0–7.0)
HEMATOCRIT: 38.5 % (ref 34.8–46.6)
HEMOGLOBIN: 11.9 g/dL (ref 11.6–15.9)
LYMPH#: 0.9 10*3/uL (ref 0.9–3.3)
LYMPH%: 47 % (ref 14.0–49.7)
MCH: 21.8 pg — AB (ref 25.1–34.0)
MCHC: 30.9 g/dL — ABNORMAL LOW (ref 31.5–36.0)
MCV: 70.7 fL — ABNORMAL LOW (ref 79.5–101.0)
MONO#: 0.1 10*3/uL (ref 0.1–0.9)
MONO%: 4.9 % (ref 0.0–14.0)
NEUT%: 46.4 % (ref 38.4–76.8)
NEUTROS ABS: 0.9 10*3/uL — AB (ref 1.5–6.5)
Platelets: 128 10*3/uL — ABNORMAL LOW (ref 145–400)
RBC: 5.44 10*6/uL (ref 3.70–5.45)
RDW: 15.8 % — AB (ref 11.2–14.5)
WBC: 2 10*3/uL — AB (ref 3.9–10.3)

## 2014-02-18 MED ORDER — NYSTATIN 100000 UNIT/ML MT SUSP
5.0000 mL | Freq: Four times a day (QID) | OROMUCOSAL | Status: DC
Start: 2014-02-18 — End: 2014-03-18

## 2014-02-18 MED ORDER — CIPROFLOXACIN HCL 500 MG PO TABS: 500.0000 mg | ORAL_TABLET | Freq: Two times a day (BID) | ORAL | Status: DC

## 2014-02-18 NOTE — Progress Notes (Signed)
[place]  [phone] [phone]) [phone]     ID: [name] OB: 05-11-1971  MR#: [id number]  BPZ#:[id number]  PCP: Tereasa Coop, PA-C GYN:  Mora Bellman, MD SU: Excell Seltzer, MD OTHER MD: Arloa Koh, MD  CHIEF COMPLAINT: Right breast cancer/neoadjuvant chemotherapy   BREAST CANCER HISTORY: Mercedes was visiting relatives in [place] about 4 months ago when she noted pain in her right breast. In 2003 she had a right breast infection which felt pretty much the same. She was treated with antibiotics and then (while living in [place]). Accordingly this time she minimized the symptoms. It felt like milk was coming down she says. She also had breast pain while having her period. Then in February she actually found 2 lumps in her right breast breast which she initially ignored. Eventually as the symptoms seemed to progress she saw a Dr. In [place] and he told her she likely had breast cancer. She waited until coming back to the states to seek definitive care.  On 12/17/2013 she had bilateral diagnostic mammography in [place] [facility]). In the upper outer right breast there were pleomorphic calcifications measuring approximately 9 cm in diameter. There were 4 distinct microlobulated masses in the right breast, 3 of them in the upper outer quadrant one in the lower outer quadrant. Each measures approximately 1.2 cm. Ultrasound showed a complex cystic mass in the superior central right breast, a 4 mm hypoechoic lesion, 3 suspicious solid masses elsewhere in the right breast, all of them hypoechoic and oval are round with increased vascularity. The largest of these masses measure 1.7 cm. Findings in the left breast were not suspicious or specific. The patient was referred to the breast Center where on 01/20/2014 she underwent biopsy of 2 of the right breast masses, and a right axillary lymph node. 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  On 01/28/2014 the patient underwent bilateral breast MRI. This showed, in the right breast, multiple irregular enhancing masses in the setting of clumped non-masslike enhancement the area in question measured up to 9.4 cm. There was diffuse asymmetric skin thickening and edema involving the right breast, but no obvious nipple or pectoralis involvement. There were several enlarged and morphologically abnormal right axillary lymph node as well as inter-pectoral and subpectoral adenopathy there the largest right axillary lymph node measured 2.7 cm. There was no internal mammary lymphadenopathy noted. The left breast was unremarkable.  Her subsequent history is as detailed below.  INTERVAL HISTORY: Elliana returns alone today for followup of her locally advanced right breast carcinoma. She is currently receiving neoadjuvant chemotherapy, with today being day 8  cycle 1 of her  4 planned dose dense cycles of doxorubicin/cyclophosphamide. She receives Neulasta on day 2 for granulocyte support.  Maddy is very tired today. She was actually seen in the emergency department yesterday with complaints of weakness, nausea, headache, and achiness. Labs were positive for pancytopenia, but her neutrophil count was 1.2. She was afebrile. She was hydrated, felt better, and was sent home.  Her counts are still low today, and in fact her [place] has dropped to 0.9. Fortunately, she still afebrile with a temp of 98.4 here in our office. Her blood pressure is good at 133/88, and she admits that she's keeping herself well hydrated. She is finding it difficult to eat, however. She has taste  aversion and a decreased appetite. She has also had intermittent nausea and dry heaves, but no emesis. Her mouth feels sensitive, as does her throat.  Yelena is still very concerned about getting  her children back to the [place] from [place]. She found out this past week that her husband is not going to be able to bring them to her, and she is likely going to have to travel to pick them up within the next 2 weeks.   REVIEW OF SYSTEMS: As noted above, Demetrice is still feeling weak and tired. Her muscles 8. She still has some pain in the affected breast and also in the right arm. She denies any peripheral swelling. She's had no fevers or chills. She began experiencing nausea on Saturday (day 5 of the cycle) after she discontinued her regular doses of antibiotics. She still has occasional nausea, but no emesis, and is having regular bowel movements. She denies hematuria or dysuria. She's had no increased cough, shortness of breath, chest pain, or palpitations. She has some occasional headaches and feels slightly dizzy at times. She denies any change in vision.  A detailed review of systems is otherwise stable and noncontributory.    PAST MEDICAL HISTORY: Past Medical History  Diagnosis Date  . Hypertension   . Blood transfusion without reported diagnosis     3 previous   . Anemia   . Ulcer     hx of stomach ulcer a long time ago  . GERD (gastroesophageal reflux disease)     a long time ago  . Headache(784.0)   . Cancer 10/2013    right breast cancer    PAST SURGICAL HISTORY: Past Surgical History  Procedure Laterality Date  . Cesarean section      2 previous  . Back surgery  2000  . Appendectomy      a long time ago in [place]  . Portacath placement N/A 02/06/2014    Procedure: INSERTION PORT-A-CATH;  Surgeon: Edward Jolly, MD;  Location: [facility];  Service: General;  Laterality: N/A;    FAMILY HISTORY Family History  Problem Relation Age of Onset  . Hypertension Mother   . Hypertension Father    the patient's parents are living but she is not sure of their age. She had 4 brothers and 4 sisters. 2 other brothers died in the [place] and one  brother is currently in [place] but they do not know where are even if he is still alive. She has one brother in this area. There is no history of breast or ovarian cancer in the family to her knowledge.  GYNECOLOGIC HISTORY: (Reviewed 02/18/2014) Menarche somewhere between the ages of 38 and 71. First live birth age 37. The patient is GX P3. Her periods are now irregular. She took birth control pills remotely for approximately one year with no complaints.  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 Her husband Jacquenette Shone Lol Dalene Carrow is a former Emergency planning/management officer to the [place] from [place] and is a Optometrist. Their children are Duopl (11),Nyamal (8) and Nyewech (4). The patient attends a [employer] locally    ADVANCED DIRECTIVES: Not in place   HEALTH MAINTENANCE: (Updated 02/11/2014) History  Substance Use Topics  . Smoking status: Never Smoker   . Smokeless tobacco: Never Used  . Alcohol Use: No     Colonoscopy: Never  PAP: April 2015/Dr. Constant  Bone density: Never  Lipid panel: Not on file  No  Known Allergies  Current Outpatient Prescriptions  Medication Sig Dispense Refill  . acetaminophen (TYLENOL) 325 MG tablet Take 650 mg by mouth every 6 (six) hours as needed for mild pain or headache.       . hydrochlorothiazide (MICROZIDE) 12.5 MG capsule Take 12.5 mg by mouth daily as needed (headache).       . loratadine (CLARITIN) 10 MG tablet Take 10 mg by mouth daily.      Marland Kitchen LORazepam (ATIVAN) 0.5 MG tablet Take 1 tablet (0.5 mg total) by mouth at bedtime as needed for anxiety or sleep (Nausea or vomiting).  30 tablet  0  . omeprazole (PRILOSEC) 40 MG capsule Take 1 capsule (40 mg total) by mouth daily.  30 capsule  6  . ondansetron (ZOFRAN) 8 MG tablet Take 1 tablet (8 mg total) by mouth 2 (two) times daily as needed for nausea or vomiting. Start on the third day after chemotherapy.  30 tablet  1  . prochlorperazine (COMPAZINE) 10 MG tablet 1 tab by mouth  with meals and bedtime x 3 days after chemo, then 1 tab by mouth every 6 hrs if needed for nausea  30 tablet  2  . baclofen (LIORESAL) 10 MG tablet Take 10 mg by mouth every morning.       . cholecalciferol (VITAMIN D) 1000 UNITS tablet Take 1,000 Units by mouth daily.      . ciprofloxacin (CIPRO) 500 MG tablet Take 1 tablet (500 mg total) by mouth 2 (two) times daily. X 7 days  14 tablet  3  . dexamethasone (DECADRON) 4 MG tablet 2 tabs by mouth with food twice daily on day before and 3 days after each chemo  60 tablet  1  . HYDROcodone-acetaminophen (NORCO/VICODIN) 5-325 MG per tablet Take 1-2 tablets by mouth every 4 (four) hours as needed (pain.).      Marland Kitchen lidocaine-prilocaine (EMLA) cream Apply to port 1 -2 hr before each procedure/port access as directed  30 g  6  . nystatin (MYCOSTATIN) 100000 UNIT/ML suspension Take 5 mLs (500,000 Units total) by mouth 4 (four) times daily. Swish, gargle and swallow  180 mL  1   No current facility-administered medications for this visit.    OBJECTIVE: African woman who appears weak and tired  Filed Vitals:   02/18/14 0831  BP: 133/88  Pulse: 61  Temp: 98.4 F (36.9 C)  Resp: 18     Body mass index is 26.69 kg/(m^2).    ECOG FS: 1 Filed Weights   02/18/14 0831  Weight: 186 lb (84.369 kg)   Physical Exam: HEENT:  Sclerae anicteric.  Oropharynx pink, and moist, but with a white coating in the posterior buccal mucosa and pharyngeal region, consistent with candidiasis.. No ulcerations or mucositis noted.  Neck is supple, trachea midline. No thyromegaly.  NODES:  No cervical or supraclavicular lymphadenopathy palpated.  BREAST EXAM: Breast exam was deferred. There is palpable adenopathy in the right axilla. Left axilla is unremarkable. LUNGS:  Clear to auscultation bilaterally.  No wheezes or rhonchi HEART:  Regular rate and rhythm. No murmur appreciated. ABDOMEN:  Soft, nontender. No organomegaly or masses palpated. Positive and normoactive bowel  sounds.  MSK:  No focal spinal tenderness to palpation. Full range of motion bilaterally in the upper extremities. EXTREMITIES:  No peripheral edema.  No clubbing or cyanosis. SKIN:  No visible rashes. No excessive ecchymoses. No petechiae. Good skin turgor. Port is intact in the left upper chest wall with well-healing incision. There  is no erythema or edema, and no evidence of infection/cellulitis. NEURO:  Nonfocal. Well oriented.  Anxious affect.      LAB RESULTS:   Lab Results  Component Value Date   WBC 2.0* 02/18/2014   NEUTROABS 0.9* 02/18/2014   HGB 11.9 02/18/2014   HCT 38.5 02/18/2014   MCV 70.7* 02/18/2014   PLT 128* 02/18/2014      Chemistry      Component Value Date/Time   NA 139 02/18/2014 0820   NA 140 02/17/2014 1527   K 4.1 02/18/2014 0820   K 4.2 02/17/2014 1527   CL 101 02/17/2014 1527   CO2 27 02/18/2014 0820   CO2 28 02/17/2014 1527   BUN 12.9 02/18/2014 0820   BUN 16 02/17/2014 1527   CREATININE 0.7 02/18/2014 0820   CREATININE 0.78 02/17/2014 1527      Component Value Date/Time   CALCIUM 9.3 02/18/2014 0820   CALCIUM 9.3 02/17/2014 1527   ALKPHOS 75 02/18/2014 0820   ALKPHOS 83 02/17/2014 1527   AST 35* 02/18/2014 0820   AST 14 02/17/2014 1527   ALT 46 02/18/2014 0820   ALT 19 02/17/2014 1527   BILITOT 1.21* 02/18/2014 0820   BILITOT 1.2 02/17/2014 1527      STUDIES:  CT Chest W Contrast 02/10/2014  CLINICAL DATA: Breast cancer.  EXAM:  CT CHEST WITH CONTRAST  TECHNIQUE:  Multidetector CT imaging of the chest was performed during  intravenous contrast administration.  CONTRAST: 53m OMNIPAQUE IOHEXOL 300 MG/ML SOLN  COMPARISON: Breast MRI 01/27/2014  FINDINGS:  Examination was chest wall demonstrates a left-sided Port-A-Cath in  position. No complicating features. The right breast demonstrates  marked skin thickening and diffuse sub areolar tumor with multiple  nodules or intramammary lymph nodes. There is also bulky axillary  and subpectoral  adenopathy. The largest axillary lymph node on image  number 19 measures 29 x 16.5 mm.  The bony thorax is intact. No destructive or sclerotic bone lesions.  The heart is normal in size. No pericardial effusion. No mediastinal  or hilar mass or lymphadenopathy. The aorta is normal in caliber.  The esophagus is grossly normal.  Examination of the lung parenchyma demonstrates no acute pulmonary  findings. No worrisome pulmonary lesions to suggest pulmonary  metastatic disease. No pleural effusion.  The upper abdomen is unremarkable.  IMPRESSION:  1. Extensive right-sided breast disease as discussed above and as  seen on the prior breast MRI. There is bulky right axillary and  subpectoral adenopathy.  2. No findings for pulmonary or osseous metastatic disease.  Electronically Signed  By: MKalman JewelsM.D.  On: 02/10/2014 15:47    Mr Breast Bilateral W Wo Contrast 01/28/2014   CLINICAL DATA:  43year old female with recently diagnosed 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  LABS:  Most recent serum creatinine 0.7 milligrams/deciliter.  EXAM: BILATERAL BREAST MRI WITH AND WITHOUT CONTRAST  TECHNIQUE: Multiplanar, multisequence MR images of both breasts were obtained prior to and following the intravenous administration of 150mof MultiHance.  THREE-DIMENSIONAL MR IMAGE RENDERING ON INDEPENDENT WORKSTATION:  Three-dimensional MR images were rendered by post-processing of the original MR data on an independent workstation. The three-dimensional MR images were interpreted, and findings are reported in the following complete MRI report for this study. Three dimensional images were evaluated at the independent DynaCad workstation  COMPARISON:  Previous exams  FINDINGS: Breast composition: c:  Heterogeneous fibroglandular tissue  Background parenchymal enhancement: Moderate  Right breast: Multiple  irregular enhancing masses with associated contiguous and surrounding areas of clumped non mass enhancement are seen throughout the central, outer, and upper-outer right breast compatible with known biopsy proven malignancy. This measures up to 9.4 cm in greatest AP dimension, approximately 5 cm transverse, and up to 7 cm craniocaudal. There is diffuse asymmetric skin thickening as well as edema involving the right breast. No obvious nipple involvement. No pectoralis involvement.  Left breast: No suspicious rapidly enhancing masses or abnormal areas of enhancement in the left breast to suggest malignancy. A small oval circumscribed mass in the upper-outer left breast measuring 0.5 x 0.6 x 0.6 cm corresponds with a benign appearing lymph node seen on the recent outside ultrasound.  Lymph nodes: Several enlarged and morphologically abnormal right axillary, interpectoral, and subpectoral lymph nodes are identified compatible with known biopsy proven axillary metastases, with a representative enlarged right axillary lymph node measuring up to 2.7 cm AP. No internal mammary lymphadenopathy seen.  Ancillary findings:  None.  IMPRESSION: Extensive malignancy involving predominately the central, outer, and upper-outer right breast measuring up to 9.4 cm in greatest AP dimension with associated right axillary (biopsy proven), interpectoral, and subpectoral lymphadenopathy.  RECOMMENDATION: Treatment for known biopsy proven malignancy in the right breast.  BI-RADS CATEGORY  6: Known biopsy-proven malignancy.   Electronically Signed   By: Everlean Alstrom M.D.   On: 01/28/2014 09:25       ASSESSMENT: 43 y.o. [place] woman [place]  (1)   status post right breast and right axillary lymph node biopsy 01/20/2014, both positive for a clinically T3 N1-2, stage III a invasive ductal carcinoma, grade 2, estrogen and progesterone receptor negative, HER-2 positive, with an MIB-1 of 85%  (2)  to be treated  in the neoadjuvant setting, the plan being to proceed with 4 dose dense cycles of doxorubicin/cyclophosphamide (first treatment 02/11/2014), with Neulasta on day 2 for granulocyte support. This is to be followed by weekly paclitaxel x12, given along with trastuzumab/pertuzumab every 3 weeks prior to definitive surgery.   (3)  Patient will likely need a right modified radical mastectomy, followed by postmastectomy radiation.   (4)  trastuzumab will be continued every 3 weeks for total of one year.  PLAN: Alycen is still trying to get her plans together to travel to [place] to pick up her 3 children. She is hoping to travel within the next 2 weeks, which would necessitate delaying her next cycle of chemotherapy from June 2 to June 9. At this point, she is not sure of her travel dates, so I an making no changes in her current schedule until we have definite dates. She will call assistant as possible to let us know when she will be out of the country, and we will adjust her schedule accordingly. I would definitely like to repeat her labs prior to travel to ensure she is no longer neutropenic.  In the meanwhile, we did review neutropenic precautions. She is being started on Cipro prophylactically, 500 mg by mouth twice a day for 7 days. She also knows to contact our office immediately with any temperatures of 100 or above.   I have also given her a prescription for nystatin mouthwash 2 swish gargle and swallow 4 times daily for the next 7 days for oropharyngeal candidiasis. Hopefully this will help the sensitivity, problems swallowing, but also decreased taste aversion she has been experiencing. I did encourage her  to keep herself well hydrated, and to let us know if she is unable to do so.   With cycle 2, we will likely add a dexamethasone taper, continuing a low dose of steroids through day 7 of this cycle. She does feel like the steroids "help her the most". Of course she can continue to utilize her other  antinausea medications as needed for any additional nausea.   The above plan was reviewed in detail with Apolonio Schneiders, and she was also given all of this information in writing today. She voices her understanding and agreement with our plan, and she knows to call with any changes or problems.  I will mention that she is currently scheduled for her PET scan this Friday, 02/21/2014, and we will adjust the schedule as necessary once we know her travel plans.    Theotis Burrow, PA-C   02/18/2014 9:18 AM

## 2014-02-20 ENCOUNTER — Telehealth: Payer: Self-pay | Admitting: *Deleted

## 2014-02-20 NOTE — Telephone Encounter (Signed)
This RN spoke with pt to notify her that PET scan scheduled for 5/29 needs to be postponed due to insurance is denying coverage secondary to " investigational - non conclusive for diagnosis "  Pt verbalized understanding as well as to keep scheduled appointment on 02/25/2014.  This RN notified radiology as well as this note will be given to AB/PA and breast navigators for appropriate follow up.

## 2014-02-21 ENCOUNTER — Encounter (HOSPITAL_COMMUNITY): Payer: Medicaid Other

## 2014-02-25 ENCOUNTER — Encounter: Payer: Self-pay | Admitting: Physician Assistant

## 2014-02-25 ENCOUNTER — Ambulatory Visit (HOSPITAL_BASED_OUTPATIENT_CLINIC_OR_DEPARTMENT_OTHER): Payer: Medicaid Other

## 2014-02-25 ENCOUNTER — Other Ambulatory Visit (HOSPITAL_BASED_OUTPATIENT_CLINIC_OR_DEPARTMENT_OTHER): Payer: Medicaid Other

## 2014-02-25 ENCOUNTER — Other Ambulatory Visit: Payer: Self-pay | Admitting: *Deleted

## 2014-02-25 ENCOUNTER — Ambulatory Visit: Payer: Medicaid Other

## 2014-02-25 ENCOUNTER — Telehealth: Payer: Self-pay | Admitting: Oncology

## 2014-02-25 ENCOUNTER — Telehealth: Payer: Self-pay | Admitting: *Deleted

## 2014-02-25 ENCOUNTER — Other Ambulatory Visit: Payer: Self-pay | Admitting: Physician Assistant

## 2014-02-25 ENCOUNTER — Telehealth: Payer: Self-pay | Admitting: Physician Assistant

## 2014-02-25 ENCOUNTER — Ambulatory Visit (HOSPITAL_BASED_OUTPATIENT_CLINIC_OR_DEPARTMENT_OTHER): Payer: Medicaid Other | Admitting: Physician Assistant

## 2014-02-25 VITALS — BP 141/89 | HR 60 | Temp 99.0°F | Resp 18 | Ht 70.0 in | Wt 184.7 lb

## 2014-02-25 DIAGNOSIS — C50419 Malignant neoplasm of upper-outer quadrant of unspecified female breast: Secondary | ICD-10-CM

## 2014-02-25 DIAGNOSIS — C50919 Malignant neoplasm of unspecified site of unspecified female breast: Secondary | ICD-10-CM

## 2014-02-25 DIAGNOSIS — C50411 Malignant neoplasm of upper-outer quadrant of right female breast: Secondary | ICD-10-CM

## 2014-02-25 DIAGNOSIS — Z5111 Encounter for antineoplastic chemotherapy: Secondary | ICD-10-CM

## 2014-02-25 DIAGNOSIS — C773 Secondary and unspecified malignant neoplasm of axilla and upper limb lymph nodes: Secondary | ICD-10-CM

## 2014-02-25 DIAGNOSIS — Z171 Estrogen receptor negative status [ER-]: Secondary | ICD-10-CM

## 2014-02-25 LAB — COMPREHENSIVE METABOLIC PANEL (CC13)
ALT: 42 U/L (ref 0–55)
ANION GAP: 13 meq/L — AB (ref 3–11)
AST: 31 U/L (ref 5–34)
Albumin: 3.5 g/dL (ref 3.5–5.0)
Alkaline Phosphatase: 95 U/L (ref 40–150)
BILIRUBIN TOTAL: 0.25 mg/dL (ref 0.20–1.20)
BUN: 18.6 mg/dL (ref 7.0–26.0)
CHLORIDE: 105 meq/L (ref 98–109)
CO2: 25 mEq/L (ref 22–29)
Calcium: 9.9 mg/dL (ref 8.4–10.4)
Creatinine: 0.8 mg/dL (ref 0.6–1.1)
Glucose: 89 mg/dl (ref 70–140)
Potassium: 3.9 mEq/L (ref 3.5–5.1)
SODIUM: 143 meq/L (ref 136–145)
TOTAL PROTEIN: 7.7 g/dL (ref 6.4–8.3)

## 2014-02-25 LAB — CBC WITH DIFFERENTIAL/PLATELET
BASO%: 0.2 % (ref 0.0–2.0)
Basophils Absolute: 0 10*3/uL (ref 0.0–0.1)
EOS%: 0.1 % (ref 0.0–7.0)
Eosinophils Absolute: 0 10*3/uL (ref 0.0–0.5)
HCT: 37.1 % (ref 34.8–46.6)
HGB: 11.8 g/dL (ref 11.6–15.9)
LYMPH#: 1.9 10*3/uL (ref 0.9–3.3)
LYMPH%: 24.2 % (ref 14.0–49.7)
MCH: 22.2 pg — ABNORMAL LOW (ref 25.1–34.0)
MCHC: 31.8 g/dL (ref 31.5–36.0)
MCV: 69.7 fL — ABNORMAL LOW (ref 79.5–101.0)
MONO#: 1 10*3/uL — ABNORMAL HIGH (ref 0.1–0.9)
MONO%: 12.6 % (ref 0.0–14.0)
NEUT#: 4.9 10*3/uL (ref 1.5–6.5)
NEUT%: 62.9 % (ref 38.4–76.8)
Platelets: 222 10*3/uL (ref 145–400)
RBC: 5.32 10*6/uL (ref 3.70–5.45)
RDW: 15.7 % — ABNORMAL HIGH (ref 11.2–14.5)
WBC: 7.8 10*3/uL (ref 3.9–10.3)

## 2014-02-25 MED ORDER — DOXORUBICIN HCL CHEMO IV INJECTION 2 MG/ML
60.0000 mg/m2 | Freq: Once | INTRAVENOUS | Status: AC
  Administered 2014-02-25: 120 mg via INTRAVENOUS
  Filled 2014-02-25: qty 60

## 2014-02-25 MED ORDER — PALONOSETRON HCL INJECTION 0.25 MG/5ML
INTRAVENOUS | Status: AC
  Filled 2014-02-25: qty 5

## 2014-02-25 MED ORDER — SODIUM CHLORIDE 0.9 % IV SOLN
Freq: Once | INTRAVENOUS | Status: AC
  Administered 2014-02-25: 10:00:00 via INTRAVENOUS

## 2014-02-25 MED ORDER — SODIUM CHLORIDE 0.9 % IV SOLN
150.0000 mg | Freq: Once | INTRAVENOUS | Status: AC
  Administered 2014-02-25: 150 mg via INTRAVENOUS
  Filled 2014-02-25: qty 5

## 2014-02-25 MED ORDER — DEXAMETHASONE SODIUM PHOSPHATE 20 MG/5ML IJ SOLN
12.0000 mg | Freq: Once | INTRAMUSCULAR | Status: AC
  Administered 2014-02-25: 12 mg via INTRAVENOUS

## 2014-02-25 MED ORDER — CYCLOPHOSPHAMIDE CHEMO INJECTION 1 GM
600.0000 mg/m2 | Freq: Once | INTRAMUSCULAR | Status: AC
  Administered 2014-02-25: 1200 mg via INTRAVENOUS
  Filled 2014-02-25: qty 60

## 2014-02-25 MED ORDER — PALONOSETRON HCL INJECTION 0.25 MG/5ML
0.2500 mg | Freq: Once | INTRAVENOUS | Status: AC
  Administered 2014-02-25: 0.25 mg via INTRAVENOUS

## 2014-02-25 MED ORDER — HEPARIN SOD (PORK) LOCK FLUSH 100 UNIT/ML IV SOLN
500.0000 [IU] | Freq: Once | INTRAVENOUS | Status: AC | PRN
  Administered 2014-02-25: 500 [IU]
  Filled 2014-02-25: qty 5

## 2014-02-25 MED ORDER — SODIUM CHLORIDE 0.9 % IJ SOLN
10.0000 mL | INTRAMUSCULAR | Status: DC | PRN
  Administered 2014-02-25: 10 mL
  Filled 2014-02-25: qty 10

## 2014-02-25 MED ORDER — DEXAMETHASONE SODIUM PHOSPHATE 20 MG/5ML IJ SOLN
INTRAMUSCULAR | Status: AC
  Filled 2014-02-25: qty 5

## 2014-02-25 NOTE — Telephone Encounter (Signed)
, °

## 2014-02-25 NOTE — Telephone Encounter (Signed)
Per staff message and POF I have scheduled appts.  JMW  

## 2014-02-25 NOTE — Patient Instructions (Signed)
[facility] Discharge Instructions for Patients Receiving Chemotherapy  Today you received the following chemotherapy agents doxorubicin/cytoxan.    To help prevent nausea and vomiting after your treatment, we encourage you to take your nausea medication as directed   If you develop nausea and vomiting that is not controlled by your nausea medication, call the clinic.   BELOW ARE SYMPTOMS THAT SHOULD BE REPORTED IMMEDIATELY:  *FEVER GREATER THAN 100.5 F  *CHILLS WITH OR WITHOUT FEVER  NAUSEA AND VOMITING THAT IS NOT CONTROLLED WITH YOUR NAUSEA MEDICATION  *UNUSUAL SHORTNESS OF BREATH  *UNUSUAL BRUISING OR BLEEDING  TENDERNESS IN MOUTH AND THROAT WITH OR WITHOUT PRESENCE OF ULCERS  *URINARY PROBLEMS  *BOWEL PROBLEMS  UNUSUAL RASH Items with * indicate a potential emergency and should be followed up as soon as possible.  Feel free to call the clinic you have any questions or concerns. The clinic phone number is [phone].  

## 2014-02-25 NOTE — Progress Notes (Signed)
[facility]  [phone] [phone]     ID: Rhonda [name] OB: 1971-08-02  MR#: [id number]  NIO#:[id number]  PCP: Tereasa Coop, PA-C GYN:  Mora Bellman, MD SU: Excell Seltzer, MD OTHER MD: Arloa Koh, MD  CHIEF COMPLAINT: Right breast cancer/neoadjuvant chemotherapy   BREAST CANCER HISTORY: Rhonda [name] was visiting relatives in [place] about 4 months ago when she noted pain in her right breast. In 2003 she had a right breast infection which felt pretty much the same. She was treated with antibiotics and then (while living in [place]). Accordingly this time she minimized the symptoms. It felt like milk was coming down she says. She also had breast pain while having her period. Then in February she actually found 2 lumps in her right breast breast which she initially ignored. Eventually as the symptoms seemed to progress she saw a Dr. In [place] and he told her she likely had breast cancer. She waited until coming [place] to seek definitive care.  On 12/17/2013 she had bilateral diagnostic mammography in [facility]). In the upper outer right breast there were pleomorphic calcifications measuring approximately 9 cm in diameter. There were 4 distinct microlobulated masses in the right breast, 3 of them in the upper outer quadrant one in the lower outer quadrant. Each measures approximately 1.2 cm. Ultrasound showed a complex cystic mass in the superior central right breast, a 4 mm hypoechoic lesion, 3 suspicious solid masses elsewhere in the right breast, all of them hypoechoic and oval are round with increased vascularity. The largest of these masses measure 1.7 cm. Findings in the left breast were not suspicious or specific. The patient was referred to the breast Center where on 01/20/2014 she underwent biopsy of 2 of the right breast masses, and a right axillary lymph node. 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  On 01/28/2014 the patient underwent bilateral breast MRI. This showed, in the right breast, multiple irregular enhancing masses in the setting of clumped non-masslike enhancement the area in question measured up to 9.4 cm. There was diffuse asymmetric skin thickening and edema involving the right breast, but no obvious nipple or pectoralis involvement. There were several enlarged and morphologically abnormal right axillary lymph node as well as inter-pectoral and subpectoral adenopathy there the largest right axillary lymph node measured 2.7 cm. There was no internal mammary lymphadenopathy noted. The left breast was unremarkable.  Her subsequent history is as detailed below.  INTERVAL HISTORY: Rhonda [name] returns alone today for followup of her locally advanced right breast carcinoma. She is currently receiving neoadjuvant chemotherapy, due for day 1 cycle 2 of 4 planned dose dense cycles of doxorubicin/cyclophosphamide today. She receives Neulasta on day 2 for granulocyte support.  Rhonda [name] is feeling well today, and in fact tells me she has felt "more energy than usual" over the last 4-5 days. Her white counts have recovered nicely. She has had no problems with fevers or chills. She's eating and drinking well with no nausea. She is walking on a daily basis, at least 30 minutes daily.  With regards to her travel plans to [place] to pick up her children, these plans have not been finalized. She is hoping to travel soon, but does not plans to stay. She tells me she will "just fly over, pick them up, and come home." For  now, she was to continue with her therapy as planned, and we're making no changes to her schedule.  very tired today. She was actually seen in the emergency department yesterday with complaints of weakness, nausea, headache, and achiness. Labs were  positive for pancytopenia, but her neutrophil count was 1.2. She was afebrile. She was hydrated, felt better, and was sent home.  REVIEW OF SYSTEMS: As noted above, Rhonda [name] has had no fevers or chills. She denies any rashes or skin changes and has had no abnormal bruising or bleeding. She has less pain in the right breast. She has had some Gen. bony pain and muscle aches over the past week. She denies any swelling in the extremities. She denies any nausea, emesis, or change in bowel or bladder habits. She's had no cough, increased shortness of breath, chest pain, or palpitations. She denies any abnormal headaches or dizziness.  A detailed review of systems is otherwise stable and noncontributory.    PAST MEDICAL HISTORY: Past Medical History  Diagnosis Date  . Hypertension   . Blood transfusion without reported diagnosis     3 previous   . Anemia   . Ulcer     hx of stomach ulcer a long time ago  . GERD (gastroesophageal reflux disease)     a long time ago  . Headache(784.0)   . Cancer 10/2013    right breast cancer    PAST SURGICAL HISTORY: Past Surgical History  Procedure Laterality Date  . Cesarean section      2 previous  . Back surgery  2000  . Appendectomy      a long time ago in [place]  . Portacath placement N/A 02/06/2014    Procedure: INSERTION PORT-A-CATH;  Surgeon: Mariella Saa, MD;  Location: [facility];  Service: General;  Laterality: N/A;    FAMILY HISTORY Family History  Problem Relation Age of Onset  . Hypertension Mother   . Hypertension Father    the patient's parents are living but she is not sure of their age. She had 4 brothers and 4 sisters. 2 other brothers died in the turmoil of [place], and one brother is currently in [place] but they do not know where are even if he is still alive. She has one brother in this area. There is no history of breast or ovarian cancer in the family to her knowledge.  GYNECOLOGIC HISTORY: (Reviewed  02/25/2014) Menarche somewhere between the ages of 68 and 89. First live birth age 10. The patient is GX P3. Her periods are now irregular. She took birth control pills remotely for approximately one year with no complaints.  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 Her husband Franchot Gallo Lol Marthann Schiller is a former Clinical cytogeneticist to the [place] from [place] and is a Occupational hygienist. Their children are Duopl (11),Nyamal (8) and Nyewech (4). The patient attends a [employer] locally    ADVANCED DIRECTIVES: Not in place   HEALTH MAINTENANCE: (Updated 006/10/2013) History  Substance Use Topics  . Smoking status: Never Smoker   . Smokeless tobacco: Never Used  . Alcohol Use: No     Colonoscopy: Never  PAP: April 2015/Dr. Constant  Bone density: Never  Lipid panel: Not on file  No Known Allergies  Current Outpatient Prescriptions  Medication Sig Dispense Refill  . cholecalciferol (VITAMIN D) 1000 UNITS tablet Take 1,000 Units by mouth daily.      . hydrochlorothiazide (MICROZIDE) 12.5 MG capsule Take 12.5 mg by  mouth daily as needed (headache).       Marland Kitchen HYDROcodone-acetaminophen (NORCO/VICODIN) 5-325 MG per tablet Take 1-2 tablets by mouth every 4 (four) hours as needed (pain.).      Marland Kitchen lidocaine-prilocaine (EMLA) cream Apply to port 1 -2 hr before each procedure/port access as directed  30 g  6  . loratadine (CLARITIN) 10 MG tablet Take 10 mg by mouth daily.      Marland Kitchen omeprazole (PRILOSEC) 40 MG capsule Take 1 capsule (40 mg total) by mouth daily.  30 capsule  6  . acetaminophen (TYLENOL) 325 MG tablet Take 650 mg by mouth every 6 (six) hours as needed for mild pain or headache.       . baclofen (LIORESAL) 10 MG tablet Take 10 mg by mouth every morning.       . ciprofloxacin (CIPRO) 500 MG tablet Take 1 tablet (500 mg total) by mouth 2 (two) times daily. X 7 days  14 tablet  3  . dexamethasone (DECADRON) 4 MG tablet 2 tabs by mouth with food twice daily on day before and 3  days after each chemo  60 tablet  1  . LORazepam (ATIVAN) 0.5 MG tablet Take 1 tablet (0.5 mg total) by mouth at bedtime as needed for anxiety or sleep (Nausea or vomiting).  30 tablet  0  . nystatin (MYCOSTATIN) 100000 UNIT/ML suspension Take 5 mLs (500,000 Units total) by mouth 4 (four) times daily. Swish, gargle and swallow  180 mL  1  . ondansetron (ZOFRAN) 8 MG tablet Take 1 tablet (8 mg total) by mouth 2 (two) times daily as needed for nausea or vomiting. Start on the third day after chemotherapy.  30 tablet  1  . prochlorperazine (COMPAZINE) 10 MG tablet 1 tab by mouth with meals and bedtime x 3 days after chemo, then 1 tab by mouth every 6 hrs if needed for nausea  30 tablet  2   No current facility-administered medications for this visit.    OBJECTIVE:   Filed Vitals:   02/25/14 0834  BP: 141/89  Pulse: 60  Temp: 99 F (37.2 C)  Resp: 18     Body mass index is 26.5 kg/(m^2).    ECOG FS: 0 Middle-aged [place] American woman who appears comfortable and is in no acute distress.  Filed Weights   02/25/14 0834  Weight: 184 lb 11.2 oz (83.779 kg)   Physical Exam: HEENT:  Sclerae anicteric.  Oropharynx pink and moist. Areas of hyperpigmentation on the tongue. No ulcerations. No evidence of oropharyngeal candidiasis. No pharyngeal erythema.  Neck is supple, trachea midline. No thyromegaly.  NODES:  No cervical or supraclavicular lymphadenopathy palpated.  BREAST EXAM:   there is an easily palpated mass in the upper-outer quadrant of the right breast, smaller and softer than before. Movable. No obvious skin changes or erythema noted in the breast. Left breast is unremarkable. There is palpable adenopathy in the right axilla, Although this also seems smaller and softer than previously examined. Left axilla is unremarkable. LUNGS:  Clear to auscultation bilaterally Was good excursion.  No wheezes or rhonchi HEART:  Regular rate and rhythm. No murmur appreciated. ABDOMEN:  Soft, nontender.  No organomegaly or masses palpated. Positive and normoactive bowel sounds.  MSK:  No focal spinal tenderness to palpation. Full range of motion bilaterally in the upper extremities. EXTREMITIES:  No peripheral edema.  No clubbing or cyanosis.No lymphedema in the right upper extremity.  SKIN:  No visible rashes. No excessive ecchymoses. No  petechiae.  Port is intact in the left upper chest wall with no erythema or edema, and no evidence of infection/cellulitis. NEURO:  Nonfocal. Well oriented. Appropriate  affect.    LAB RESULTS:   Lab Results  Component Value Date   WBC 7.8 02/25/2014   NEUTROABS 4.9 02/25/2014   HGB 11.8 02/25/2014   HCT 37.1 02/25/2014   MCV 69.7* 02/25/2014   PLT 222 02/25/2014      Chemistry      Component Value Date/Time   NA 143 02/25/2014 0817   NA 140 02/17/2014 1527   K 3.9 02/25/2014 0817   K 4.2 02/17/2014 1527   CL 101 02/17/2014 1527   CO2 25 02/25/2014 0817   CO2 28 02/17/2014 1527   BUN 18.6 02/25/2014 0817   BUN 16 02/17/2014 1527   CREATININE 0.8 02/25/2014 0817   CREATININE 0.78 02/17/2014 1527      Component Value Date/Time   CALCIUM 9.9 02/25/2014 0817   CALCIUM 9.3 02/17/2014 1527   ALKPHOS 95 02/25/2014 0817   ALKPHOS 83 02/17/2014 1527   AST 31 02/25/2014 0817   AST 14 02/17/2014 1527   ALT 42 02/25/2014 0817   ALT 19 02/17/2014 1527   BILITOT 0.25 02/25/2014 0817   BILITOT 1.2 02/17/2014 1527      STUDIES:  CT Chest W Contrast 02/10/2014  CLINICAL DATA: Breast cancer.  EXAM:  CT CHEST WITH CONTRAST  TECHNIQUE:  Multidetector CT imaging of the chest was performed during  intravenous contrast administration.  CONTRAST: 49m OMNIPAQUE IOHEXOL 300 MG/ML SOLN  COMPARISON: Breast MRI 01/27/2014  FINDINGS:  Examination was chest wall demonstrates a left-sided Port-A-Cath in  position. No complicating features. The right breast demonstrates  marked skin thickening and diffuse sub areolar tumor with multiple  nodules or intramammary lymph nodes. There is also  bulky axillary  and subpectoral adenopathy. The largest axillary lymph node on image  number 19 measures 29 x 16.5 mm.  The bony thorax is intact. No destructive or sclerotic bone lesions.  The heart is normal in size. No pericardial effusion. No mediastinal  or hilar mass or lymphadenopathy. The aorta is normal in caliber.  The esophagus is grossly normal.  Examination of the lung parenchyma demonstrates no acute pulmonary  findings. No worrisome pulmonary lesions to suggest pulmonary  metastatic disease. No pleural effusion.  The upper abdomen is unremarkable.  IMPRESSION:  1. Extensive right-sided breast disease as discussed above and as  seen on the prior breast MRI. There is bulky right axillary and  subpectoral adenopathy.  2. No findings for pulmonary or osseous metastatic disease.  Electronically Signed  By: MKalman JewelsM.D.  On: 02/10/2014 15:47    Mr Breast Bilateral W Wo Contrast 01/28/2014   CLINICAL DATA:  43year old female with recently diagnosed 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  LABS:  Most recent serum creatinine 0.7 milligrams/deciliter.  EXAM: BILATERAL BREAST MRI WITH AND WITHOUT CONTRAST  TECHNIQUE: Multiplanar, multisequence MR images of both breasts were obtained prior to and following the intravenous administration of 199mof MultiHance.  THREE-DIMENSIONAL MR IMAGE RENDERING ON INDEPENDENT WORKSTATION:  Three-dimensional MR images were rendered by post-processing of the original MR data on an independent workstation. The three-dimensional MR images were interpreted, and findings are reported in the following complete MRI report for this study. Three dimensional images were evaluated at the independent DynaCad workstation  COMPARISON:  Previous exams  FINDINGS: Breast composition: c:  Heterogeneous fibroglandular tissue  Background parenchymal enhancement:  Moderate  Right breast: Multiple irregular enhancing masses with associated contiguous and surrounding areas of clumped non mass enhancement are seen throughout the central, outer, and upper-outer right breast compatible with known biopsy proven malignancy. This measures up to 9.4 cm in greatest AP dimension, approximately 5 cm transverse, and up to 7 cm craniocaudal. There is diffuse asymmetric skin thickening as well as edema involving the right breast. No obvious nipple involvement. No pectoralis involvement.  Left breast: No suspicious rapidly enhancing masses or abnormal areas of enhancement in the left breast to suggest malignancy. A small oval circumscribed mass in the upper-outer left breast measuring 0.5 x 0.6 x 0.6 cm corresponds with a benign appearing lymph node seen on the recent outside ultrasound.  Lymph nodes: Several enlarged and morphologically abnormal right axillary, interpectoral, and subpectoral lymph nodes are identified compatible with known biopsy proven axillary metastases, with a representative enlarged right axillary lymph node measuring up to 2.7 cm AP. No internal mammary lymphadenopathy seen.  Ancillary findings:  None.  IMPRESSION: Extensive malignancy involving predominately the central, outer, and upper-outer right breast measuring up to 9.4 cm in greatest AP dimension with associated right axillary (biopsy proven), interpectoral, and subpectoral lymphadenopathy.  RECOMMENDATION: Treatment for known biopsy proven malignancy in the right breast.  BI-RADS CATEGORY  6: Known biopsy-proven malignancy.   Electronically Signed   By: Everlean Alstrom M.D.   On: 01/28/2014 09:25       ASSESSMENT: 43 y.o. [place] woman [place]  (1)   status post right breast and right axillary lymph node biopsy 01/20/2014, both positive for a clinically T3 N1-2, stage III a invasive ductal carcinoma, grade 2, estrogen and progesterone receptor negative, HER-2 positive, with an  MIB-1 of 85%  (2)  to be treated in the neoadjuvant setting, the plan being to proceed with 4 dose dense cycles of doxorubicin/cyclophosphamide (first treatment 02/11/2014), with Neulasta on day 2 for granulocyte support. This is to be followed by weekly paclitaxel x12, given along with trastuzumab/pertuzumab every 3 weeks prior to definitive surgery.   (3)  Patient will likely need a right modified radical mastectomy, followed by postmastectomy radiation.   (4)  trastuzumab will be continued every 3 weeks for total of one year.  PLAN: Rhonda [name]  will proceed to treatment today as scheduled for her second neoadjuvant cycle of dose dense doxorubicin/cyclophosphamide. She is already scheduled for her injection tomorrow, and I will see her again next week on June 10 for assessment of chemotoxicity. We will go ahead and schedule her out through early July today, completing all 4 of her doxorubicin/cyclophosphamide infusions.   We will continue to follow Rhonda [name] very closely. Of course if her travel plans work out, we will adjust her schedule if necessary. Otherwise, we'll plan on seeing her on a weekly basis.   The above plan was reviewed in detail with Rhonda [name], and she voices both her understanding and agreement with this plan. She will call with any changes or problems.   I will make note that currently, insurance has denied her  PET scan so this has not been obtained. I will make our breast navigators aware of this situation to insure appropriate followup.     Theotis Burrow, PA-C   02/25/2014 9:35 AM

## 2014-02-26 ENCOUNTER — Ambulatory Visit (HOSPITAL_BASED_OUTPATIENT_CLINIC_OR_DEPARTMENT_OTHER): Payer: Medicaid Other

## 2014-02-26 VITALS — BP 118/72 | HR 72 | Temp 98.0°F

## 2014-02-26 DIAGNOSIS — C50919 Malignant neoplasm of unspecified site of unspecified female breast: Secondary | ICD-10-CM

## 2014-02-26 DIAGNOSIS — C50411 Malignant neoplasm of upper-outer quadrant of right female breast: Secondary | ICD-10-CM

## 2014-02-26 DIAGNOSIS — Z5189 Encounter for other specified aftercare: Secondary | ICD-10-CM

## 2014-02-26 MED ORDER — PEGFILGRASTIM INJECTION 6 MG/0.6ML
6.0000 mg | Freq: Once | SUBCUTANEOUS | Status: AC
  Administered 2014-02-26: 6 mg via SUBCUTANEOUS
  Filled 2014-02-26: qty 0.6

## 2014-03-03 ENCOUNTER — Telehealth: Payer: Self-pay | Admitting: *Deleted

## 2014-03-03 ENCOUNTER — Encounter (HOSPITAL_COMMUNITY): Payer: Self-pay

## 2014-03-03 ENCOUNTER — Ambulatory Visit (HOSPITAL_COMMUNITY)
Admission: RE | Admit: 2014-03-03 | Discharge: 2014-03-03 | Disposition: A | Discharge status: Home / Self Care | Payer: Medicaid Other | Source: Ambulatory Visit | Attending: Physician Assistant | Admitting: Physician Assistant

## 2014-03-03 DIAGNOSIS — M47817 Spondylosis without myelopathy or radiculopathy, lumbosacral region: Secondary | ICD-10-CM | POA: Insufficient documentation

## 2014-03-03 DIAGNOSIS — C50919 Malignant neoplasm of unspecified site of unspecified female breast: Secondary | ICD-10-CM | POA: Insufficient documentation

## 2014-03-03 DIAGNOSIS — C50419 Malignant neoplasm of upper-outer quadrant of unspecified female breast: Secondary | ICD-10-CM

## 2014-03-03 MED ORDER — IOHEXOL 300 MG/ML  SOLN
100.0000 mL | Freq: Once | INTRAMUSCULAR | Status: AC | PRN
  Administered 2014-03-03: 100 mL via INTRAVENOUS

## 2014-03-03 NOTE — Telephone Encounter (Signed)
Scheduled for 3:30 pm CT scan today.  Walk in form received reads "feeling like getting a cold.  My little one had a cold and I am on chemotherapy."  Patient reports itchy, runny nose.  Dry cough.  Taking antibiotics I was given last week.  Denies fever.  Took claritin after injections and has run out.     Provider input needed: URI symptoms   Reason for call: Walk in form received   Ears, nose, mouth, throat, and face: positive for itchy rhinorrhea Respiratory: positive for cough   ALLERGIES:  has No Known Allergies.  Patient last received chemotherapy/ treatment on 02-25-2014 cycle 2 A/C  Patient was last seen in the office on 02-25-2014  Next appt is 03-05-2014  Is patient having fevers greater than 100.5?  no, denies fever.   Is patient having uncontrolled pain, or new pain? no   Is patient having new back pain that changes with position (worsens or eases when laying down?)  no   Is patient able to eat and drink? yes    Is patient able to pass stool without difficulty?   yes     Is patient having uncontrolled nausea?  no    patient calls 03/03/2014 with complaint of  Ears, nose, mouth, throat, and face: positive for itchy rhinorrhea Respiratory: positive for cough and Taking cipro antibiotic with two more days left of 7 day course.  Denies seasonal allergies but describes cough as an itching that begins when you feel something in yoiur nose and throat and you have to clear .  27 year old daughter had a cold three days ago.  Very concerned because receiving chemotherapy and a cold for someone else could be serious for me.  No other occupants in the home.      Summary Based on the above information advised patient to perform good hand washing and to keep hand pump sanitizer at all sinks for household use.  Complete the antibiotic.  Encouraged to treat the symptoms trying claratin or anything over the counter to dry the nasal drainage.  Force fluids an call if temp > 100.5.  The normal  flora within her household is better than getting sick from someone outside her home.  Asked about her next chemotherapy on 03-05-2014.  This will be determined based on lab work and f/u on 03-05-2014.  No further questions.         Rhonda [name]  03/03/2014, 2:40 PM   Background Info  Rhonda [name]   DOB: 1971-09-18   MR#: [id number]   CSN#   [id number] 03/03/2014

## 2014-03-05 ENCOUNTER — Other Ambulatory Visit (HOSPITAL_BASED_OUTPATIENT_CLINIC_OR_DEPARTMENT_OTHER): Payer: Medicaid Other

## 2014-03-05 ENCOUNTER — Telehealth: Payer: Self-pay | Admitting: Adult Health

## 2014-03-05 ENCOUNTER — Ambulatory Visit (HOSPITAL_BASED_OUTPATIENT_CLINIC_OR_DEPARTMENT_OTHER): Payer: Medicaid Other | Admitting: Physician Assistant

## 2014-03-05 ENCOUNTER — Encounter: Payer: Self-pay | Admitting: Physician Assistant

## 2014-03-05 VITALS — BP 111/75 | HR 69 | Temp 98.6°F | Resp 18 | Ht 70.0 in | Wt 186.9 lb

## 2014-03-05 DIAGNOSIS — C50919 Malignant neoplasm of unspecified site of unspecified female breast: Secondary | ICD-10-CM

## 2014-03-05 DIAGNOSIS — C773 Secondary and unspecified malignant neoplasm of axilla and upper limb lymph nodes: Secondary | ICD-10-CM

## 2014-03-05 DIAGNOSIS — D649 Anemia, unspecified: Secondary | ICD-10-CM | POA: Insufficient documentation

## 2014-03-05 DIAGNOSIS — K0889 Other specified disorders of teeth and supporting structures: Secondary | ICD-10-CM

## 2014-03-05 DIAGNOSIS — C50411 Malignant neoplasm of upper-outer quadrant of right female breast: Secondary | ICD-10-CM

## 2014-03-05 DIAGNOSIS — C50419 Malignant neoplasm of upper-outer quadrant of unspecified female breast: Secondary | ICD-10-CM

## 2014-03-05 DIAGNOSIS — R6884 Jaw pain: Secondary | ICD-10-CM

## 2014-03-05 DIAGNOSIS — Z170 Estrogen receptor positive status [ER+]: Secondary | ICD-10-CM

## 2014-03-05 DIAGNOSIS — R110 Nausea: Secondary | ICD-10-CM

## 2014-03-05 DIAGNOSIS — B370 Candidal stomatitis: Secondary | ICD-10-CM

## 2014-03-05 LAB — CBC WITH DIFFERENTIAL/PLATELET
BASO%: 0.6 % (ref 0.0–2.0)
Basophils Absolute: 0 10*3/uL (ref 0.0–0.1)
EOS ABS: 0 10*3/uL (ref 0.0–0.5)
EOS%: 0.1 % (ref 0.0–7.0)
HEMATOCRIT: 34 % — AB (ref 34.8–46.6)
HEMOGLOBIN: 10.7 g/dL — AB (ref 11.6–15.9)
LYMPH%: 21.5 % (ref 14.0–49.7)
MCH: 22 pg — ABNORMAL LOW (ref 25.1–34.0)
MCHC: 31.5 g/dL (ref 31.5–36.0)
MCV: 69.6 fL — ABNORMAL LOW (ref 79.5–101.0)
MONO#: 0.6 10*3/uL (ref 0.1–0.9)
MONO%: 13.3 % (ref 0.0–14.0)
NEUT%: 64.5 % (ref 38.4–76.8)
NEUTROS ABS: 2.8 10*3/uL (ref 1.5–6.5)
Platelets: 183 10*3/uL (ref 145–400)
RBC: 4.89 10*6/uL (ref 3.70–5.45)
RDW: 15.6 % — ABNORMAL HIGH (ref 11.2–14.5)
WBC: 4.4 10*3/uL (ref 3.9–10.3)
lymph#: 0.9 10*3/uL (ref 0.9–3.3)

## 2014-03-05 LAB — COMPREHENSIVE METABOLIC PANEL (CC13)
ALT: 28 U/L (ref 0–55)
ANION GAP: 6 meq/L (ref 3–11)
AST: 19 U/L (ref 5–34)
Albumin: 3.1 g/dL — ABNORMAL LOW (ref 3.5–5.0)
Alkaline Phosphatase: 98 U/L (ref 40–150)
BILIRUBIN TOTAL: 0.42 mg/dL (ref 0.20–1.20)
BUN: 14.8 mg/dL (ref 7.0–26.0)
CALCIUM: 9.1 mg/dL (ref 8.4–10.4)
CO2: 31 meq/L — AB (ref 22–29)
CREATININE: 0.7 mg/dL (ref 0.6–1.1)
Chloride: 103 mEq/L (ref 98–109)
GLUCOSE: 98 mg/dL (ref 70–140)
Potassium: 4.3 mEq/L (ref 3.5–5.1)
Sodium: 141 mEq/L (ref 136–145)
TOTAL PROTEIN: 7 g/dL (ref 6.4–8.3)

## 2014-03-05 NOTE — Telephone Encounter (Signed)
, °

## 2014-03-05 NOTE — Progress Notes (Signed)
[place]  Telephone:(336) [phone] Fax:(336) [phone]     ID: Rhonda [name] OB: 1971-04-17  MR#: [id number]  LDJ#:[id number]  PCP: Tereasa Coop, PA-C GYN:  Mora Bellman, MD SU: Excell Seltzer, MD OTHER MD: Arloa Koh, MD  CHIEF COMPLAINT: Right breast cancer/neoadjuvant chemotherapy   BREAST CANCER HISTORY: Rhonda [name] was visiting relatives in [place] about 4 months ago when she noted pain in her right breast. In 2003 she had a right breast infection which felt pretty much the same. She was treated with antibiotics back then (while living in [place]). Accordingly, this time she minimized the symptoms. It felt like milk was coming down she says. She also had breast pain while having her period. Then in February 2015, she actually found 2 lumps in her right breast breast which she initially ignored. Eventually as the symptoms seemed to progress she saw a Dr. In [place] and he told her she likely had breast cancer. She waited until coming [place] to seek definitive care.  On 12/17/2013 she had bilateral diagnostic mammography in [place] [facility]). In the upper outer right breast there were pleomorphic calcifications measuring approximately 9 cm in diameter. There were 4 distinct microlobulated masses in the right breast, 3 of them in the upper outer quadrant one in the lower outer quadrant. Each measures approximately 1.2 cm. Ultrasound showed a complex cystic mass in the superior central right breast, a 4 mm hypoechoic lesion, 3 suspicious solid masses elsewhere in the right breast, all of them hypoechoic and oval are round with increased vascularity. The largest of these masses measure 1.7 cm. Findings in the left breast were not suspicious or specific. The patient was referred to the breast Center where on 01/20/2014 she underwent biopsy of 2 of the right breast masses, and a right axillary lymph node. 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  On 01/28/2014 the patient underwent bilateral breast MRI. This showed, in the right breast, multiple irregular enhancing masses in the setting of clumped non-masslike enhancement the area in question measured up to 9.4 cm. There was diffuse asymmetric skin thickening and edema involving the right breast, but no obvious nipple or pectoralis involvement. There were several enlarged and morphologically abnormal right axillary lymph node as well as inter-pectoral and subpectoral adenopathy there the largest right axillary lymph node measured 2.7 cm. There was no internal mammary lymphadenopathy noted. The left breast was unremarkable.  Her subsequent history is as detailed below.  INTERVAL HISTORY: Rhonda [name] returns alone today for followup of her locally advanced right breast carcinoma. She is currently receiving neoadjuvant chemotherapy, with today being day 9 cycle 2 of 4 planned dose dense cycles of doxorubicin/cyclophosphamide today. She receives Neulasta on day 2 for granulocyte support.  Rhonda [name] is recovering well from cycle 2.  She did have some increased nausea this time around with occasional emesis, but tells me this all resolved earlier this week.  Currently, she is able to eat and drink, and is keeping herself well hydrated. Her biggest complaint today is pain in a right upper molar which is beginning to radiate into the jaw. She does not have a current dentist.   Interval history is notable for the fact that Rhonda [name] had CTs of the abdomen and pelvis on 03/03/2014, after a PET scan was denied by her insurance. Fortunately, the CTs  showed no acute findings and no evidence of metastatic disease. The results are detailed below.  REVIEW OF SYSTEMS: Rhonda [name] had a "cold" earlier this week with sinus congestion and a runny nose. She took Claritin which was  helpful. Fortunately, she has had no fevers or chills. She has had no rashes or skin changes. She denies any signs of abnormal bruising or bleeding. She notes no change in bowel or bladder habits, and denies either diarrhea or constipation. She has no cough or phlegm production, and has had no increased shortness of breath. She denies any peripheral swelling and has had no chest pain or palpitations. She's had no abnormal headaches or dizziness. She does have some bony aches and pains following the Neulasta injection, especially in her shoulders and upper back. This is gradually improving, and she denies any pain elsewhere today. I will mention that she is having less pain in the right breast itself.  A detailed review of systems is otherwise stable and noncontributory.   PAST MEDICAL HISTORY: Past Medical History  Diagnosis Date  . Hypertension   . Blood transfusion without reported diagnosis     3 previous   . Anemia   . Ulcer     hx of stomach ulcer a long time ago  . GERD (gastroesophageal reflux disease)     a long time ago  . Headache(784.0)   . Cancer 10/2013    right breast cancer    PAST SURGICAL HISTORY: Past Surgical History  Procedure Laterality Date  . Cesarean section      2 previous  . Back surgery  2000  . Appendectomy      a long time ago in [place]  . Portacath placement N/A 02/06/2014    Procedure: INSERTION PORT-A-CATH;  Surgeon: Edward Jolly, MD;  Location: [facility];  Service: General;  Laterality: N/A;    FAMILY HISTORY Family History  Problem Relation Age of Onset  . Hypertension Mother   . Hypertension Father    the patient's parents are living but she is not sure of their age. She had 4 brothers and 4 sisters. 2 other brothers died in the [place] and one brother is currently in [place] but they do not know where are even if he is still alive. She has one brother in this area. There is no history of breast or ovarian cancer in the family  to her knowledge.  GYNECOLOGIC HISTORY: (Reviewed 03/05/2014) Menarche somewhere between the ages of 40 and 34. First live birth age 47. The patient is GX P3. Her periods are now irregular. She took birth control pills remotely for approximately one year with no complaints.  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 Her husband Jacquenette Rhonda [name] is a former Emergency planning/management officer to the [place] from [place] and is a Optometrist. Their children are Rhonda [name] (11),Rhonda [name] (8) and Rhonda [name] (4). The patient attends a [employer] locally    ADVANCED DIRECTIVES: Not in place   HEALTH MAINTENANCE: (Updated 03/05/2014) History  Substance Use Topics  . Smoking status: Never Smoker   . Smokeless tobacco: Never Used  . Alcohol Use: No     Colonoscopy: Never  PAP: April 2015/Dr. Constant  Bone density: Never  Lipid panel: Not on file  No Known Allergies  Current Outpatient Prescriptions  Medication Sig Dispense Refill  . baclofen (LIORESAL) 10 MG tablet Take 10 mg by mouth every morning.       . cholecalciferol (VITAMIN  D) 1000 UNITS tablet Take 1,000 Units by mouth daily.      . ciprofloxacin (CIPRO) 500 MG tablet Take 1 tablet (500 mg total) by mouth 2 (two) times daily. X 7 days  14 tablet  3  . hydrochlorothiazide (MICROZIDE) 12.5 MG capsule Take 12.5 mg by mouth daily as needed (headache).       Marland Kitchen omeprazole (PRILOSEC) 40 MG capsule Take 1 capsule (40 mg total) by mouth daily.  30 capsule  6  . acetaminophen (TYLENOL) 325 MG tablet Take 650 mg by mouth every 6 (six) hours as needed for mild pain or headache.       . dexamethasone (DECADRON) 4 MG tablet 2 tabs by mouth with food twice daily on day before and 3 days after each chemo  60 tablet  1  . HYDROcodone-acetaminophen (NORCO/VICODIN) 5-325 MG per tablet Take 1-2 tablets by mouth every 4 (four) hours as needed (pain.).      Marland Kitchen lidocaine-prilocaine (EMLA) cream Apply to port 1 -2 hr before each procedure/port access as  directed  30 g  6  . loratadine (CLARITIN) 10 MG tablet Take 10 mg by mouth daily.      Marland Kitchen LORazepam (ATIVAN) 0.5 MG tablet Take 1 tablet (0.5 mg total) by mouth at bedtime as needed for anxiety or sleep (Nausea or vomiting).  30 tablet  0  . nystatin (MYCOSTATIN) 100000 UNIT/ML suspension Take 5 mLs (500,000 Units total) by mouth 4 (four) times daily. Swish, gargle and swallow  180 mL  1  . ondansetron (ZOFRAN) 8 MG tablet Take 1 tablet (8 mg total) by mouth 2 (two) times daily as needed for nausea or vomiting. Start on the third day after chemotherapy.  30 tablet  1  . prochlorperazine (COMPAZINE) 10 MG tablet 1 tab by mouth with meals and bedtime x 3 days after chemo, then 1 tab by mouth every 6 hrs if needed for nausea  30 tablet  2   No current facility-administered medications for this visit.    OBJECTIVE:   Filed Vitals:   03/05/14 1052  BP: 111/75  Pulse: 69  Temp: 98.6 F (37 C)  Resp: 18     Body mass index is 26.82 kg/(m^2).    ECOG FS: 0 Middle-aged [place] American woman who appears tired but is in no acute distress.  Filed Weights   03/05/14 1052  Weight: 186 lb 14.4 oz (84.777 kg)   Physical Exam: HEENT:  Sclerae anicteric.  Oropharynx pink and moist. Areas of hyperpigmentation on the tongue. No ulcerations. No evidence of oropharyngeal candidiasis. No pharyngeal erythema.  There is visible darkness, consistent with decay, in a right upper molar, with mild gingival erythema.  Neck is supple, trachea midline. No thyromegaly palpated. NODES:  No cervical or supraclavicular lymphadenopathy palpated.  BREAST EXAM:  Breast exam was deferred today. There is palpable adenopathy in the right axilla. Left axilla is unremarkable. LUNGS:  Clear to auscultation bilaterally.  No crackles, wheezes or rhonchi HEART:  Regular rate and rhythm. No murmur appreciated. ABDOMEN:  Soft, nontender. No organomegaly or masses palpated. Positive bowel sounds.  MSK:  No focal spinal tenderness to  palpation. Full range of motion bilaterally in the upper extremities. EXTREMITIES:  No peripheral edema.  No clubbing or cyanosis.No lymphedema in the right upper extremity.  SKIN:  No visible rashes. No excessive ecchymoses. No petechiae.  Port is intact in the left upper chest wall with no erythema or edema, and no evidence of infection/cellulitis.  NEURO:  Nonfocal. Well oriented. Worried affect.    LAB RESULTS:   Lab Results  Component Value Date   WBC 4.4 03/05/2014   NEUTROABS 2.8 03/05/2014   HGB 10.7* 03/05/2014   HCT 34.0* 03/05/2014   MCV 69.6* 03/05/2014   PLT 183 03/05/2014      Chemistry      Component Value Date/Time   NA 141 03/05/2014 1041   NA 140 02/17/2014 1527   K 4.3 03/05/2014 1041   K 4.2 02/17/2014 1527   CL 101 02/17/2014 1527   CO2 31* 03/05/2014 1041   CO2 28 02/17/2014 1527   BUN 14.8 03/05/2014 1041   BUN 16 02/17/2014 1527   CREATININE 0.7 03/05/2014 1041   CREATININE 0.78 02/17/2014 1527      Component Value Date/Time   CALCIUM 9.1 03/05/2014 1041   CALCIUM 9.3 02/17/2014 1527   ALKPHOS 98 03/05/2014 1041   ALKPHOS 83 02/17/2014 1527   AST 19 03/05/2014 1041   AST 14 02/17/2014 1527   ALT 28 03/05/2014 1041   ALT 19 02/17/2014 1527   BILITOT 0.42 03/05/2014 1041   BILITOT 1.2 02/17/2014 1527      STUDIES:  Ct Abdomen Pelvis W Contrast 03/04/2014   CLINICAL DATA:  Breast cancer  EXAM: CT ABDOMEN AND PELVIS WITH CONTRAST  TECHNIQUE: Multidetector CT imaging of the abdomen and pelvis was performed using the standard protocol following bolus administration of intravenous contrast.  CONTRAST:  17m OMNIPAQUE IOHEXOL 300 MG/ML  SOLN  COMPARISON:  None.  FINDINGS: The lung bases are clear.  There is no liver abnormality identified. The gallbladder appears normal. No biliary dilatation. Normal appearance of the pancreas. The spleen is unremarkable.  Normal appearance of both adrenal glands. The right kidney appears normal. The left kidney is normal. Urinary bladder  appears within normal limits. The uterus and adnexal structures have a normal physiologic appearance.  Normal caliber of the abdominal aorta. There is no aneurysm. There is no pelvic or inguinal adenopathy identified.  The stomach is within normal limits. The small bowel loops are normal. Normal appearance of the colon.  There is no free fluid or abnormal fluid collections identified. No peritoneal nodule or mass identified.  Review of the visualized osseous structures is significant for mild lumbar spondylosis. There is no aggressive lytic or sclerotic bone lesions.  IMPRESSION: 1. No acute findings and no evidence for metastatic disease within the abdomen or pelvis.   Electronically Signed   By: TKerby MoorsM.D.   On: 03/04/2014 08:15    CT Chest W Contrast 02/10/2014  CLINICAL DATA: Breast cancer.  EXAM:  CT CHEST WITH CONTRAST  TECHNIQUE:  Multidetector CT imaging of the chest was performed during  intravenous contrast administration.  CONTRAST: 884mOMNIPAQUE IOHEXOL 300 MG/ML SOLN  COMPARISON: Breast MRI 01/27/2014  FINDINGS:  Examination was chest wall demonstrates a left-sided Port-A-Cath in  position. No complicating features. The right breast demonstrates  marked skin thickening and diffuse sub areolar tumor with multiple  nodules or intramammary lymph nodes. There is also bulky axillary  and subpectoral adenopathy. The largest axillary lymph node on image  number 19 measures 29 x 16.5 mm.  The bony thorax is intact. No destructive or sclerotic bone lesions.  The heart is normal in size. No pericardial effusion. No mediastinal  or hilar mass or lymphadenopathy. The aorta is normal in caliber.  The esophagus is grossly normal.  Examination of the lung parenchyma demonstrates no acute pulmonary  findings.  No worrisome pulmonary lesions to suggest pulmonary  metastatic disease. No pleural effusion.  The upper abdomen is unremarkable.  IMPRESSION:  1. Extensive right-sided breast  disease as discussed above and as  seen on the prior breast MRI. There is bulky right axillary and  subpectoral adenopathy.  2. No findings for pulmonary or osseous metastatic disease.  Electronically Signed  By: Kalman Jewels M.D.  On: 02/10/2014 15:47    Mr Breast Bilateral W Wo Contrast 01/28/2014   CLINICAL DATA:  44 year old female with recently diagnosed 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  LABS:  Most recent serum creatinine 0.7 milligrams/deciliter.  EXAM: BILATERAL BREAST MRI WITH AND WITHOUT CONTRAST  TECHNIQUE: Multiplanar, multisequence MR images of both breasts were obtained prior to and following the intravenous administration of 73m of MultiHance.  THREE-DIMENSIONAL MR IMAGE RENDERING ON INDEPENDENT WORKSTATION:  Three-dimensional MR images were rendered by post-processing of the original MR data on an independent workstation. The three-dimensional MR images were interpreted, and findings are reported in the following complete MRI report for this study. Three dimensional images were evaluated at the independent DynaCad workstation  COMPARISON:  Previous exams  FINDINGS: Breast composition: c:  Heterogeneous fibroglandular tissue  Background parenchymal enhancement: Moderate  Right breast: Multiple irregular enhancing masses with associated contiguous and surrounding areas of clumped non mass enhancement are seen throughout the central, outer, and upper-outer right breast compatible with known biopsy proven malignancy. This measures up to 9.4 cm in greatest AP dimension, approximately 5 cm transverse, and up to 7 cm craniocaudal. There is diffuse asymmetric skin thickening as well as edema involving the right breast. No obvious nipple involvement. No pectoralis involvement.  Left breast: No suspicious rapidly enhancing masses or abnormal areas of enhancement in the left breast to  suggest malignancy. A small oval circumscribed mass in the upper-outer left breast measuring 0.5 x 0.6 x 0.6 cm corresponds with a benign appearing lymph node seen on the recent outside ultrasound.  Lymph nodes: Several enlarged and morphologically abnormal right axillary, interpectoral, and subpectoral lymph nodes are identified compatible with known biopsy proven axillary metastases, with a representative enlarged right axillary lymph node measuring up to 2.7 cm AP. No internal mammary lymphadenopathy seen.  Ancillary findings:  None.  IMPRESSION: Extensive malignancy involving predominately the central, outer, and upper-outer right breast measuring up to 9.4 cm in greatest AP dimension with associated right axillary (biopsy proven), interpectoral, and subpectoral lymphadenopathy.  RECOMMENDATION: Treatment for known biopsy proven malignancy in the right breast.  BI-RADS CATEGORY  6: Known biopsy-proven malignancy.   Electronically Signed   By: JEverlean AlstromM.D.   On: 01/28/2014 09:25       ASSESSMENT: 43y.o. [place]  (1)   status post right breast and right axillary lymph node biopsy 01/20/2014, both positive for a clinically T3 N1-2, stage III a invasive ductal carcinoma, grade 2, estrogen and progesterone receptor negative, HER-2 positive, with an MIB-1 of 85%  (2)  to be treated in the neoadjuvant setting, the plan being to proceed with 4 dose dense cycles of doxorubicin/cyclophosphamide (first treatment 02/11/2014), with Neulasta on day 2 for granulocyte support. This is to be followed by weekly paclitaxel x12, given along with trastuzumab/pertuzumab every 3 weeks prior to definitive surgery.   (3)  Patient will likely need a right modified radical mastectomy, followed by postmastectomy radiation.   (  4)  trastuzumab will be continued every 3 weeks for total of one year.  PLAN: Kinzlie appears to be tolerating treatment reasonably well. I will see her  back again next week on June 16 in anticipation of her third dose dense cycle of neoadjuvant doxorubicin/cyclophosphamide. We will again review her antinausea regimen and make sure she knows how to take all of her medications appropriately.  In the meanwhile, I am also requesting an appointment with Dr. Enrique Sack for further evaluation of the right molar pain and jaw pain, primarily to assess for signs of abscess since the patient is receiving active chemotherapy with a history of neutropenia.  We will continue to follow Ardena very closely. Of course if her travel plans work out and she is able to travel to [place] and pick up her children as she hopes, we will adjust her schedule as necessary. Otherwise, we'll plan on seeing her on a weekly basis.   The above plan was reviewed in detail with Rhonda [name], and she voices both her understanding and agreement with this plan. She will call with any changes or problems.   Alexi Dorminey, PA-C   03/05/2014 1:09 PM

## 2014-03-06 ENCOUNTER — Telehealth: Payer: Self-pay | Admitting: Physician Assistant

## 2014-03-11 ENCOUNTER — Ambulatory Visit (HOSPITAL_BASED_OUTPATIENT_CLINIC_OR_DEPARTMENT_OTHER): Payer: Medicaid Other

## 2014-03-11 ENCOUNTER — Telehealth: Payer: Self-pay | Admitting: *Deleted

## 2014-03-11 ENCOUNTER — Other Ambulatory Visit (HOSPITAL_BASED_OUTPATIENT_CLINIC_OR_DEPARTMENT_OTHER): Payer: Medicaid Other

## 2014-03-11 ENCOUNTER — Encounter: Payer: Self-pay | Admitting: Physician Assistant

## 2014-03-11 ENCOUNTER — Ambulatory Visit (HOSPITAL_BASED_OUTPATIENT_CLINIC_OR_DEPARTMENT_OTHER): Payer: Medicaid Other | Admitting: Physician Assistant

## 2014-03-11 VITALS — BP 121/79 | HR 69 | Temp 98.4°F | Resp 20 | Ht 70.0 in | Wt 191.5 lb

## 2014-03-11 DIAGNOSIS — K1379 Other lesions of oral mucosa: Secondary | ICD-10-CM

## 2014-03-11 DIAGNOSIS — C773 Secondary and unspecified malignant neoplasm of axilla and upper limb lymph nodes: Secondary | ICD-10-CM

## 2014-03-11 DIAGNOSIS — C50411 Malignant neoplasm of upper-outer quadrant of right female breast: Secondary | ICD-10-CM

## 2014-03-11 DIAGNOSIS — C50919 Malignant neoplasm of unspecified site of unspecified female breast: Secondary | ICD-10-CM

## 2014-03-11 DIAGNOSIS — K089 Disorder of teeth and supporting structures, unspecified: Secondary | ICD-10-CM

## 2014-03-11 DIAGNOSIS — D649 Anemia, unspecified: Secondary | ICD-10-CM

## 2014-03-11 DIAGNOSIS — R5381 Other malaise: Secondary | ICD-10-CM

## 2014-03-11 DIAGNOSIS — C50419 Malignant neoplasm of upper-outer quadrant of unspecified female breast: Secondary | ICD-10-CM

## 2014-03-11 DIAGNOSIS — Z171 Estrogen receptor negative status [ER-]: Secondary | ICD-10-CM

## 2014-03-11 DIAGNOSIS — B370 Candidal stomatitis: Secondary | ICD-10-CM

## 2014-03-11 DIAGNOSIS — R5383 Other fatigue: Secondary | ICD-10-CM

## 2014-03-11 DIAGNOSIS — Z5111 Encounter for antineoplastic chemotherapy: Secondary | ICD-10-CM

## 2014-03-11 LAB — COMPREHENSIVE METABOLIC PANEL (CC13)
ALBUMIN: 3.2 g/dL — AB (ref 3.5–5.0)
ALT: 17 U/L (ref 0–55)
ANION GAP: 6 meq/L (ref 3–11)
AST: 18 U/L (ref 5–34)
Alkaline Phosphatase: 81 U/L (ref 40–150)
BUN: 18.2 mg/dL (ref 7.0–26.0)
CO2: 28 meq/L (ref 22–29)
CREATININE: 0.8 mg/dL (ref 0.6–1.1)
Calcium: 9.3 mg/dL (ref 8.4–10.4)
Chloride: 108 mEq/L (ref 98–109)
Glucose: 103 mg/dl (ref 70–140)
Potassium: 4 mEq/L (ref 3.5–5.1)
SODIUM: 142 meq/L (ref 136–145)
TOTAL PROTEIN: 6.8 g/dL (ref 6.4–8.3)
Total Bilirubin: 0.2 mg/dL (ref 0.20–1.20)

## 2014-03-11 LAB — CBC WITH DIFFERENTIAL/PLATELET
BASO%: 0.4 % (ref 0.0–2.0)
Basophils Absolute: 0 10*3/uL (ref 0.0–0.1)
EOS%: 0 % (ref 0.0–7.0)
Eosinophils Absolute: 0 10*3/uL (ref 0.0–0.5)
HEMATOCRIT: 33.1 % — AB (ref 34.8–46.6)
HGB: 10.4 g/dL — ABNORMAL LOW (ref 11.6–15.9)
LYMPH#: 1.4 10*3/uL (ref 0.9–3.3)
LYMPH%: 14.4 % (ref 14.0–49.7)
MCH: 22 pg — ABNORMAL LOW (ref 25.1–34.0)
MCHC: 31.5 g/dL (ref 31.5–36.0)
MCV: 69.8 fL — ABNORMAL LOW (ref 79.5–101.0)
MONO#: 0.9 10*3/uL (ref 0.1–0.9)
MONO%: 9.3 % (ref 0.0–14.0)
NEUT#: 7.4 10*3/uL — ABNORMAL HIGH (ref 1.5–6.5)
NEUT%: 75.9 % (ref 38.4–76.8)
Platelets: 239 10*3/uL (ref 145–400)
RBC: 4.75 10*6/uL (ref 3.70–5.45)
RDW: 15.5 % — ABNORMAL HIGH (ref 11.2–14.5)
WBC: 9.7 10*3/uL (ref 3.9–10.3)

## 2014-03-11 MED ORDER — PALONOSETRON HCL INJECTION 0.25 MG/5ML
INTRAVENOUS | Status: AC
  Filled 2014-03-11: qty 5

## 2014-03-11 MED ORDER — SODIUM CHLORIDE 0.9 % IV SOLN
600.0000 mg/m2 | Freq: Once | INTRAVENOUS | Status: AC
  Administered 2014-03-11: 1200 mg via INTRAVENOUS
  Filled 2014-03-11: qty 60

## 2014-03-11 MED ORDER — HEPARIN SOD (PORK) LOCK FLUSH 100 UNIT/ML IV SOLN
500.0000 [IU] | Freq: Once | INTRAVENOUS | Status: AC | PRN
  Administered 2014-03-11: 500 [IU]
  Filled 2014-03-11: qty 5

## 2014-03-11 MED ORDER — MAGIC MOUTHWASH W/LIDOCAINE: 5.0000 mL | Freq: Four times a day (QID) | ORAL | Status: DC | PRN

## 2014-03-11 MED ORDER — AMOXICILLIN 875 MG PO TABS: 875.0000 mg | ORAL_TABLET | Freq: Two times a day (BID) | ORAL | Status: DC

## 2014-03-11 MED ORDER — DEXAMETHASONE SODIUM PHOSPHATE 20 MG/5ML IJ SOLN
12.0000 mg | Freq: Once | INTRAMUSCULAR | Status: AC
  Administered 2014-03-11: 12 mg via INTRAVENOUS

## 2014-03-11 MED ORDER — DOXORUBICIN HCL CHEMO IV INJECTION 2 MG/ML
60.0000 mg/m2 | Freq: Once | INTRAVENOUS | Status: AC
Start: 2014-03-11 — End: 2014-03-11
  Administered 2014-03-11: 120 mg via INTRAVENOUS
  Filled 2014-03-11: qty 60

## 2014-03-11 MED ORDER — PALONOSETRON HCL INJECTION 0.25 MG/5ML
0.2500 mg | Freq: Once | INTRAVENOUS | Status: AC
  Administered 2014-03-11: 0.25 mg via INTRAVENOUS

## 2014-03-11 MED ORDER — DEXAMETHASONE SODIUM PHOSPHATE 20 MG/5ML IJ SOLN
INTRAMUSCULAR | Status: AC
  Filled 2014-03-11: qty 5

## 2014-03-11 MED ORDER — SODIUM CHLORIDE 0.9 % IV SOLN
Freq: Once | INTRAVENOUS | Status: AC
  Administered 2014-03-11: 15:00:00 via INTRAVENOUS

## 2014-03-11 MED ORDER — SODIUM CHLORIDE 0.9 % IJ SOLN
10.0000 mL | INTRAMUSCULAR | Status: DC | PRN
  Administered 2014-03-11: 10 mL
  Filled 2014-03-11: qty 10

## 2014-03-11 MED ORDER — FLUCONAZOLE 100 MG PO TABS: 100.0000 mg | ORAL_TABLET | Freq: Every day | ORAL | Status: DC

## 2014-03-11 MED ORDER — SODIUM CHLORIDE 0.9 % IV SOLN
150.0000 mg | Freq: Once | INTRAVENOUS | Status: AC
  Administered 2014-03-11: 150 mg via INTRAVENOUS
  Filled 2014-03-11: qty 5

## 2014-03-11 NOTE — Telephone Encounter (Signed)
Per staff phone call and POF I have schedueld appts. Scheduler advised of appts.  JMW  

## 2014-03-11 NOTE — Progress Notes (Signed)
[facility]  [phone] [phone]     ID: Rhonda [name] OB: 16-Dec-1970  MR#: [id number]  DKR#:[id number]  PCP: Loyce Dys, PA-C GYN:  Catalina Antigua, MD SU: Glenna Fellows, MD OTHER MD: Chipper Herb, MD  CHIEF COMPLAINT: Right breast cancer/neoadjuvant chemotherapy   BREAST CANCER HISTORY: Rhonda [name] was visiting relatives in [place] about 4 months ago when she noted pain in her right breast. In 2003 she had a right breast infection which felt pretty much the same. She was treated with antibiotics back then (while living in [place]). Accordingly, this time she minimized the symptoms. It felt like milk was coming down she says. She also had breast pain while having her period. Then in February 2015, she actually found 2 lumps in her right breast breast which she initially ignored. Eventually as the symptoms seemed to progress she saw a Dr. [place] and he told her she likely had breast cancer. She waited until coming [place] the states to seek definitive care.  On 12/17/2013 she had bilateral diagnostic mammography in [place] [facility]). In the upper outer right breast there were pleomorphic calcifications measuring approximately 9 cm in diameter. There were 4 distinct microlobulated masses in the right breast, 3 of them in the upper outer quadrant one in the lower outer quadrant. Each measures approximately 1.2 cm. Ultrasound showed a complex cystic mass in the superior central right breast, a 4 mm hypoechoic lesion, 3 suspicious solid masses elsewhere in the right breast, all of them hypoechoic and oval are round with increased vascularity. The largest of these masses measure 1.7 cm. Findings in the left breast were not suspicious or specific. The patient was referred to the breast Center where on 01/20/2014 she underwent biopsy of 2 of the right breast masses, and a right axillary lymph node. 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  On 01/28/2014 the patient underwent bilateral breast MRI. This showed, in the right breast, multiple irregular enhancing masses in the setting of clumped non-masslike enhancement the area in question measured up to 9.4 cm. There was diffuse asymmetric skin thickening and edema involving the right breast, but no obvious nipple or pectoralis involvement. There were several enlarged and morphologically abnormal right axillary lymph node as well as inter-pectoral and subpectoral adenopathy there the largest right axillary lymph node measured 2.7 cm. There was no internal mammary lymphadenopathy noted. The left breast was unremarkable.  Her subsequent history is as detailed below.  INTERVAL HISTORY: Rhonda [name] returns alone today for followup of her locally advanced right breast carcinoma. She is currently receiving neoadjuvant chemotherapy and is due for day 1 cycle 3 of 4 planned dose dense cycles of doxorubicin/cyclophosphamide today. She receives Neulasta on day 2 for granulocyte support.  Rhonda [name] continues to complain of fatigue. She continues to have pain in a right upper molar. Unfortunately, we had referred her to Dr. Kristin Bruins who is currently out of town, and his office referred her to a different dentist. She plans to contact her dentist today for an appointment right away.  Fortunately, although the pain has not improved, and has not worsened.  She denies any fevers or chills.  Rhonda [name] also had some significant oral sensitivity yesterday, primarily affecting the time, after eating pineapple. She denies any actual ulcerations. Today, her mouth feels better, and she is able to  eat and drink with very little discomfort currently, other than the sensitivity in the right molar.    REVIEW OF SYSTEMS: Rhonda [name]  has some occasional hot flashes. She's had no  rashes or skin changes. She denies any abnormal bruising or bleeding. She tells me she eats very little on the week immediately following chemotherapy but then "makes up for it" the following week. Currently, she has no nausea and has had no emesis. She notes no change in bowel or bladder habits. She denies any cough, phlegm production, or increased shortness of breath. She denies any peripheral swelling and has had no chest pain or palpitations.  She occasionally has some shooting pains in the right breast. She's had no abnormal headaches, change in vision,  or dizziness. she currently denies any unusual myalgias, arthralgias, or bony pain.   A detailed review of systems is otherwise stable and noncontributory.   PAST MEDICAL HISTORY: Past Medical History  Diagnosis Date  . Hypertension   . Blood transfusion without reported diagnosis     3 previous   . Anemia   . Ulcer     hx of stomach ulcer a long time ago  . GERD (gastroesophageal reflux disease)     a long time ago  . Headache(784.0)   . Cancer 10/2013    right breast cancer    PAST SURGICAL HISTORY: Past Surgical History  Procedure Laterality Date  . Cesarean section      2 previous  . Back surgery  2000  . Appendectomy      a long time ago in [place]  . Portacath placement N/A 02/06/2014    Procedure: INSERTION PORT-A-CATH;  Surgeon: Edward Jolly, MD;  Location: [facility];  Service: General;  Laterality: N/A;    FAMILY HISTORY Family History  Problem Relation Age of Onset  . Hypertension Mother   . Hypertension Father    the patient's parents are living but she is not sure of their age. She had 4 brothers and 4 sisters. 2 other brothers died in the turmoil [place] and one brother is currently in [place] but they do not know where are even if he is still alive. She has one brother in this area. There is no history of breast or ovarian cancer in the family to her knowledge.  GYNECOLOGIC HISTORY: (Reviewed  03/11/2014) Menarche somewhere between the ages of 62 and 11. First live birth age 5. The patient is GX P3. Her periods are now irregular. She took birth control pills remotely for approximately one year with no complaints.  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 Her husband Jacquenette Shone Lol Dalene Carrow is a former Emergency planning/management officer to the [place] from [place] and is a Optometrist. Their children are Duopl (11),Nyamal (8) and Nyewech (4).  The youngest child is currently living with Apolonio Schneiders. The 2 older children are currently in [place]. The patient attends a [employer] locally    ADVANCED DIRECTIVES: Not in place   HEALTH MAINTENANCE: (Updated 03/11/2014) History  Substance Use Topics  . Smoking status: Never Smoker   . Smokeless tobacco: Never Used  . Alcohol Use: No     Colonoscopy: Never  PAP: April 2015/Dr. Constant  Bone density: Never  Lipid panel: Not on file  No Known Allergies  Current Outpatient Prescriptions  Medication Sig Dispense Refill  . baclofen (LIORESAL) 10 MG tablet Take 10 mg by mouth every morning.       . cholecalciferol (VITAMIN  D) 1000 UNITS tablet Take 1,000 Units by mouth daily.      . hydrochlorothiazide (MICROZIDE) 12.5 MG capsule Take 12.5 mg by mouth daily as needed (headache).       . lidocaine-prilocaine (EMLA) cream Apply to port 1 -2 hr before each procedure/port access as directed  30 g  6  . loratadine (CLARITIN) 10 MG tablet Take 10 mg by mouth daily.      Marland Kitchen omeprazole (PRILOSEC) 40 MG capsule Take 1 capsule (40 mg total) by mouth daily.  30 capsule  6  . acetaminophen (TYLENOL) 325 MG tablet Take 650 mg by mouth every 6 (six) hours as needed for mild pain or headache.       . Alum & Mag Hydroxide-Simeth (MAGIC MOUTHWASH W/LIDOCAINE) SOLN Take 5 mLs by mouth 4 (four) times daily as needed for mouth pain.  240 mL  1  . amoxicillin (AMOXIL) 875 MG tablet Take 1 tablet (875 mg total) by mouth 2 (two) times daily. X 10 days  20  tablet  0  . dexamethasone (DECADRON) 4 MG tablet 2 tabs by mouth with food twice daily on day before and 3 days after each chemo  60 tablet  1  . fluconazole (DIFLUCAN) 100 MG tablet Take 1 tablet (100 mg total) by mouth daily. X 10 days  10 tablet  0  . HYDROcodone-acetaminophen (NORCO/VICODIN) 5-325 MG per tablet Take 1-2 tablets by mouth every 4 (four) hours as needed (pain.).      Marland Kitchen LORazepam (ATIVAN) 0.5 MG tablet Take 1 tablet (0.5 mg total) by mouth at bedtime as needed for anxiety or sleep (Nausea or vomiting).  30 tablet  0  . nystatin (MYCOSTATIN) 100000 UNIT/ML suspension Take 5 mLs (500,000 Units total) by mouth 4 (four) times daily. Swish, gargle and swallow  180 mL  1  . ondansetron (ZOFRAN) 8 MG tablet Take 1 tablet (8 mg total) by mouth 2 (two) times daily as needed for nausea or vomiting. Start on the third day after chemotherapy.  30 tablet  1  . prochlorperazine (COMPAZINE) 10 MG tablet 1 tab by mouth with meals and bedtime x 3 days after chemo, then 1 tab by mouth every 6 hrs if needed for nausea  30 tablet  2   No current facility-administered medications for this visit.   Facility-Administered Medications Ordered in Other Visits  Medication Dose Route Frequency Provider Last Rate Last Dose  . cyclophosphamide (CYTOXAN) 1,200 mg in sodium chloride 0.9 % 250 mL chemo infusion  600 mg/m2 (Order-Specific) Intravenous Once Amy G Berry, PA-C      . DOXOrubicin (ADRIAMYCIN) chemo injection 120 mg  60 mg/m2 (Order-Specific) Intravenous Once [employer], PA-C   120 mg at 03/11/14 1603  . heparin lock flush 100 unit/mL  500 Units Intracatheter Once PRN Amy Milda Smart, PA-C      . sodium chloride 0.9 % injection 10 mL  10 mL Intracatheter PRN Amy Milda Smart, PA-C        OBJECTIVE:   Filed Vitals:   03/11/14 1426  BP: 121/79  Pulse: 69  Temp: 98.4 F (36.9 C)  Resp: 20     Body mass index is 27.48 kg/(m^2).    ECOG FS: 1 Middle-aged [place] American woman who appears tired but is in  no acute distress.  Filed Weights   03/11/14 1426  Weight: 191 lb 8 oz (86.864 kg)   Physical Exam: HEENT:  Sclerae anicteric.  Oropharynx  moist. Areas of  hyperpigmentation on the tongue. No ulcerations.  is a white coating on the time, with white patches in the posterior buccal mucosa, consistent with oropharyngeal candidiasis. No pharyngeal erythema.  There is visible darkness, consistent with decay, in a right upper molar, with mild gingival erythema.  Neck is supple, trachea midline. No thyromegaly palpated. NODES:  No cervical or supraclavicular lymphadenopathy palpated.  BREAST EXAM:   The palpable mass in the right breast appears softer, but there has been little change in the size since her last exam, with the mass still occupying the majority of the upper outer quadrant of the right breast. The mass is movable. There no obvious skin changes or erythema, and no nipple discharge. There is palpable adenopathy in the right axilla. Left axilla is unremarkable. LUNGS:  Clear to auscultation bilaterally.  No crackles, wheezes or rhonchi HEART:  Regular rate and rhythm. No murmur appreciated. ABDOMEN:  Soft, nontender. No organomegaly or masses palpated. Positive bowel sounds.  MSK:  No focal spinal tenderness to palpation. Full range of motion bilaterally in the upper extremities. EXTREMITIES:  No peripheral edema.  No clubbing or cyanosis.  No lymphedema in the right upper extremity.  SKIN:  No visible rashes. No excessive ecchymoses. No petechiae. Good skin turgor. Skin is warm and dry. Port is intact in the left upper chest wall with no erythema or edema, and no evidence of infection/cellulitis. NEURO:  Nonfocal. Well oriented. Fatigued affect.    LAB RESULTS:   Lab Results  Component Value Date   WBC 9.7 03/11/2014   NEUTROABS 7.4* 03/11/2014   HGB 10.4* 03/11/2014   HCT 33.1* 03/11/2014   MCV 69.8* 03/11/2014   PLT 239 03/11/2014      Chemistry      Component Value Date/Time    NA 142 03/11/2014 1329   NA 140 02/17/2014 1527   K 4.0 03/11/2014 1329   K 4.2 02/17/2014 1527   CL 101 02/17/2014 1527   CO2 28 03/11/2014 1329   CO2 28 02/17/2014 1527   BUN 18.2 03/11/2014 1329   BUN 16 02/17/2014 1527   CREATININE 0.8 03/11/2014 1329   CREATININE 0.78 02/17/2014 1527      Component Value Date/Time   CALCIUM 9.3 03/11/2014 1329   CALCIUM 9.3 02/17/2014 1527   ALKPHOS 81 03/11/2014 1329   ALKPHOS 83 02/17/2014 1527   AST 18 03/11/2014 1329   AST 14 02/17/2014 1527   ALT 17 03/11/2014 1329   ALT 19 02/17/2014 1527   BILITOT 0.20 03/11/2014 1329   BILITOT 1.2 02/17/2014 1527      STUDIES:  Ct Abdomen Pelvis W Contrast 03/04/2014   CLINICAL DATA:  Breast cancer  EXAM: CT ABDOMEN AND PELVIS WITH CONTRAST  TECHNIQUE: Multidetector CT imaging of the abdomen and pelvis was performed using the standard protocol following bolus administration of intravenous contrast.  CONTRAST:  119m OMNIPAQUE IOHEXOL 300 MG/ML  SOLN  COMPARISON:  None.  FINDINGS: The lung bases are clear.  There is no liver abnormality identified. The gallbladder appears normal. No biliary dilatation. Normal appearance of the pancreas. The spleen is unremarkable.  Normal appearance of both adrenal glands. The right kidney appears normal. The left kidney is normal. Urinary bladder appears within normal limits. The uterus and adnexal structures have a normal physiologic appearance.  Normal caliber of the abdominal aorta. There is no aneurysm. There is no pelvic or inguinal adenopathy identified.  The stomach is within normal limits. The small bowel loops are normal. Normal appearance  of the colon.  There is no free fluid or abnormal fluid collections identified. No peritoneal nodule or mass identified.  Review of the visualized osseous structures is significant for mild lumbar spondylosis. There is no aggressive lytic or sclerotic bone lesions.  IMPRESSION: 1. No acute findings and no evidence for metastatic disease within the  abdomen or pelvis.   Electronically Signed   By: Kerby Moors M.D.   On: 03/04/2014 08:15    CT Chest W Contrast 02/10/2014  CLINICAL DATA: Breast cancer.  EXAM:  CT CHEST WITH CONTRAST  TECHNIQUE:  Multidetector CT imaging of the chest was performed during  intravenous contrast administration.  CONTRAST: 83m OMNIPAQUE IOHEXOL 300 MG/ML SOLN  COMPARISON: Breast MRI 01/27/2014  FINDINGS:  Examination was chest wall demonstrates a left-sided Port-A-Cath in  position. No complicating features. The right breast demonstrates  marked skin thickening and diffuse sub areolar tumor with multiple  nodules or intramammary lymph nodes. There is also bulky axillary  and subpectoral adenopathy. The largest axillary lymph node on image  number 19 measures 29 x 16.5 mm.  The bony thorax is intact. No destructive or sclerotic bone lesions.  The heart is normal in size. No pericardial effusion. No mediastinal  or hilar mass or lymphadenopathy. The aorta is normal in caliber.  The esophagus is grossly normal.  Examination of the lung parenchyma demonstrates no acute pulmonary  findings. No worrisome pulmonary lesions to suggest pulmonary  metastatic disease. No pleural effusion.  The upper abdomen is unremarkable.  IMPRESSION:  1. Extensive right-sided breast disease as discussed above and as  seen on the prior breast MRI. There is bulky right axillary and  subpectoral adenopathy.  2. No findings for pulmonary or osseous metastatic disease.  Electronically Signed  By: MKalman JewelsM.D.  On: 02/10/2014 15:47    Mr Breast Bilateral W Wo Contrast 01/28/2014   CLINICAL DATA:  43year old female with recently diagnosed 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  LABS:  Most recent serum creatinine 0.7 milligrams/deciliter.  EXAM: BILATERAL BREAST MRI WITH AND WITHOUT CONTRAST  TECHNIQUE:  Multiplanar, multisequence MR images of both breasts were obtained prior to and following the intravenous administration of 1101mof MultiHance.  THREE-DIMENSIONAL MR IMAGE RENDERING ON INDEPENDENT WORKSTATION:  Three-dimensional MR images were rendered by post-processing of the original MR data on an independent workstation. The three-dimensional MR images were interpreted, and findings are reported in the following complete MRI report for this study. Three dimensional images were evaluated at the independent DynaCad workstation  COMPARISON:  Previous exams  FINDINGS: Breast composition: c:  Heterogeneous fibroglandular tissue  Background parenchymal enhancement: Moderate  Right breast: Multiple irregular enhancing masses with associated contiguous and surrounding areas of clumped non mass enhancement are seen throughout the central, outer, and upper-outer right breast compatible with known biopsy proven malignancy. This measures up to 9.4 cm in greatest AP dimension, approximately 5 cm transverse, and up to 7 cm craniocaudal. There is diffuse asymmetric skin thickening as well as edema involving the right breast. No obvious nipple involvement. No pectoralis involvement.  Left breast: No suspicious rapidly enhancing masses or abnormal areas of enhancement in the left breast to suggest malignancy. A small oval circumscribed mass in the upper-outer left breast measuring 0.5 x 0.6 x 0.6 cm corresponds with a benign appearing lymph node seen on the recent outside ultrasound.  Lymph nodes: Several enlarged and  morphologically abnormal right axillary, interpectoral, and subpectoral lymph nodes are identified compatible with known biopsy proven axillary metastases, with a representative enlarged right axillary lymph node measuring up to 2.7 cm AP. No internal mammary lymphadenopathy seen.  Ancillary findings:  None.  IMPRESSION: Extensive malignancy involving predominately the central, outer, and upper-outer right breast  measuring up to 9.4 cm in greatest AP dimension with associated right axillary (biopsy proven), interpectoral, and subpectoral lymphadenopathy.  RECOMMENDATION: Treatment for known biopsy proven malignancy in the right breast.  BI-RADS CATEGORY  6: Known biopsy-proven malignancy.   Electronically Signed   By: Everlean Alstrom M.D.   On: 01/28/2014 09:25       ASSESSMENT: 43 y.o. [place] woman [place]  (1)   status post right breast and right axillary lymph node biopsy 01/20/2014, both positive for a clinically T3 N1-2, stage III a invasive ductal carcinoma, grade 2, estrogen and progesterone receptor negative, HER-2 positive, with an MIB-1 of 85%  (2)  to be treated in the neoadjuvant setting, the plan being to proceed with 4 dose dense cycles of doxorubicin/cyclophosphamide (first treatment 02/11/2014), with Neulasta on day 2 for granulocyte support. This is to be followed by weekly paclitaxel x12, given along with trastuzumab/pertuzumab every 3 weeks prior to definitive surgery.   (3)  Patient will likely need a right modified radical mastectomy, followed by postmastectomy radiation.   (4)  trastuzumab will be continued every 3 weeks for total of one year.  PLAN: The majority of our 25 minute appointment today was spent reviewing the patient's concerns, discussing side effects, reviewing her treatment plan, and coordinating care.  Rhonda [name] will proceed to treatment today as scheduled for her third dose dense cycle of neoadjuvant doxorubicin/cyclophosphamide, and will return tomorrow for her Neulasta injection as scheduled.   She assures me she will contact the dentist this afternoon and will make an appointment as soon as possible for further evaluation of the right molar. I explained that our concern was that there was infection, and possibly an abscess developing. In the meanwhile, since she continues to receive chemotherapy and has a history of afebrile  chemotherapy-induced neutropenia, I'm going to go ahead and start her prophylactically on amoxicillin, 875 mg by mouth twice a day for the next 10 days.   She also has mild thrush, and I am starting her on Diflucan, 100 mg daily for the next 10 days, until she completes the amoxicillin. I have given her a written prescription for Magic mouthwash (liquid Benadryl, Maalox, and viscous lidocaine) to use for oral sensitivity if that recurs. Currently, it seems to have improved, with the exception of the pain around the molar itself.   We will continue to follow Rhonda [name] very closely, and I will see her again next week on June 23 for assessment of chemotoxicity. She scheduled for her fourth and final cycle of doxorubicin/cyclophosphamide 2 weeks from now on June 30, and will begin her weekly paclitaxel with q. three-week trastuzumab/pertuzumab 2 weeks later on July 14. All of those appointments are being scheduled for her accordingly. We will plan on repeating her breast MRI in early July prior to changing her treatment regimen.   Of course if her travel plans work out and she is able to travel to [place] and pick up her children as she hopes, we will adjust her schedule as necessary. Otherwise, we'll plan on seeing her on a weekly basis.   The above plan was reviewed in detail with Apolonio Schneiders, and  she was given all of this information in writing today. She voices both her understanding and agreement with this plan and knows to call with any changes or problems.   BERRY,AMY, PA-C   03/11/2014 4:12 PM

## 2014-03-11 NOTE — Patient Instructions (Signed)
[facility] Discharge Instructions for Patients Receiving Chemotherapy  Today you received the following chemotherapy agents Adriamycin/Cytoxan To help prevent nausea and vomiting after your treatment, we encourage you to take your nausea medication as prescribed. If you develop nausea and vomiting that is not controlled by your nausea medication, call the clinic.   BELOW ARE SYMPTOMS THAT SHOULD BE REPORTED IMMEDIATELY:  *FEVER GREATER THAN 100.5 F  *CHILLS WITH OR WITHOUT FEVER  NAUSEA AND VOMITING THAT IS NOT CONTROLLED WITH YOUR NAUSEA MEDICATION  *UNUSUAL SHORTNESS OF BREATH  *UNUSUAL BRUISING OR BLEEDING  TENDERNESS IN MOUTH AND THROAT WITH OR WITHOUT PRESENCE OF ULCERS  *URINARY PROBLEMS  *BOWEL PROBLEMS  UNUSUAL RASH Items with * indicate a potential emergency and should be followed up as soon as possible.  Feel free to call the clinic you have any questions or concerns. The clinic phone number is [phone].    

## 2014-03-12 ENCOUNTER — Telehealth: Payer: Self-pay | Admitting: *Deleted

## 2014-03-12 ENCOUNTER — Ambulatory Visit (HOSPITAL_BASED_OUTPATIENT_CLINIC_OR_DEPARTMENT_OTHER): Payer: Medicaid Other

## 2014-03-12 DIAGNOSIS — Z5189 Encounter for other specified aftercare: Secondary | ICD-10-CM

## 2014-03-12 DIAGNOSIS — C50411 Malignant neoplasm of upper-outer quadrant of right female breast: Secondary | ICD-10-CM

## 2014-03-12 DIAGNOSIS — C50419 Malignant neoplasm of upper-outer quadrant of unspecified female breast: Secondary | ICD-10-CM

## 2014-03-12 DIAGNOSIS — C50919 Malignant neoplasm of unspecified site of unspecified female breast: Secondary | ICD-10-CM

## 2014-03-12 MED ORDER — PEGFILGRASTIM INJECTION 6 MG/0.6ML
6.0000 mg | Freq: Once | SUBCUTANEOUS | Status: AC
  Administered 2014-03-12: 6 mg via SUBCUTANEOUS
  Filled 2014-03-12: qty 0.6

## 2014-03-12 NOTE — Telephone Encounter (Signed)
Per staff message and POF I have scheduled appts. Advised scheduler of appts. JMW  

## 2014-03-12 NOTE — Patient Instructions (Signed)

## 2014-03-13 ENCOUNTER — Telehealth: Payer: Self-pay | Admitting: Adult Health

## 2014-03-13 NOTE — Telephone Encounter (Signed)
per pof to sch pt 6/30 trmt-cld pt moble# it states voicemail not set up-home# NOT taking any calls at this time. mailed pt copy of sch w/6/30 trmts time-snet note to adv pt unable to reach by tele

## 2014-03-18 ENCOUNTER — Other Ambulatory Visit (HOSPITAL_BASED_OUTPATIENT_CLINIC_OR_DEPARTMENT_OTHER): Payer: Medicaid Other

## 2014-03-18 ENCOUNTER — Ambulatory Visit (HOSPITAL_BASED_OUTPATIENT_CLINIC_OR_DEPARTMENT_OTHER): Payer: Medicaid Other | Admitting: Physician Assistant

## 2014-03-18 ENCOUNTER — Encounter: Payer: Self-pay | Admitting: Physician Assistant

## 2014-03-18 ENCOUNTER — Other Ambulatory Visit: Payer: Self-pay | Admitting: Physician Assistant

## 2014-03-18 ENCOUNTER — Telehealth: Payer: Self-pay | Admitting: Oncology

## 2014-03-18 VITALS — BP 110/72 | HR 75 | Temp 98.7°F | Resp 18 | Ht 70.0 in | Wt 191.4 lb

## 2014-03-18 DIAGNOSIS — K1379 Other lesions of oral mucosa: Secondary | ICD-10-CM

## 2014-03-18 DIAGNOSIS — D649 Anemia, unspecified: Secondary | ICD-10-CM

## 2014-03-18 DIAGNOSIS — C50411 Malignant neoplasm of upper-outer quadrant of right female breast: Secondary | ICD-10-CM

## 2014-03-18 DIAGNOSIS — R110 Nausea: Secondary | ICD-10-CM

## 2014-03-18 DIAGNOSIS — C50919 Malignant neoplasm of unspecified site of unspecified female breast: Secondary | ICD-10-CM

## 2014-03-18 DIAGNOSIS — K1370 Unspecified lesions of oral mucosa: Secondary | ICD-10-CM

## 2014-03-18 LAB — CBC WITH DIFFERENTIAL/PLATELET
BASO%: 0.2 % (ref 0.0–2.0)
Basophils Absolute: 0 10*3/uL (ref 0.0–0.1)
EOS%: 0.3 % (ref 0.0–7.0)
Eosinophils Absolute: 0 10*3/uL (ref 0.0–0.5)
HCT: 32.8 % — ABNORMAL LOW (ref 34.8–46.6)
HGB: 10.3 g/dL — ABNORMAL LOW (ref 11.6–15.9)
LYMPH#: 0.6 10*3/uL — AB (ref 0.9–3.3)
LYMPH%: 16.8 % (ref 14.0–49.7)
MCH: 21.9 pg — AB (ref 25.1–34.0)
MCHC: 31.4 g/dL — AB (ref 31.5–36.0)
MCV: 69.8 fL — AB (ref 79.5–101.0)
MONO#: 0.2 10*3/uL (ref 0.1–0.9)
MONO%: 4.4 % (ref 0.0–14.0)
NEUT#: 3 10*3/uL (ref 1.5–6.5)
NEUT%: 78.3 % — ABNORMAL HIGH (ref 38.4–76.8)
Platelets: 156 10*3/uL (ref 145–400)
RBC: 4.7 10*6/uL (ref 3.70–5.45)
RDW: 15.1 % — AB (ref 11.2–14.5)
WBC: 3.8 10*3/uL — ABNORMAL LOW (ref 3.9–10.3)

## 2014-03-18 LAB — COMPREHENSIVE METABOLIC PANEL (CC13)
ALK PHOS: 98 U/L (ref 40–150)
ALT: 22 U/L (ref 0–55)
AST: 15 U/L (ref 5–34)
Albumin: 3 g/dL — ABNORMAL LOW (ref 3.5–5.0)
Anion Gap: 7 mEq/L (ref 3–11)
BUN: 10.4 mg/dL (ref 7.0–26.0)
CALCIUM: 9.4 mg/dL (ref 8.4–10.4)
CHLORIDE: 105 meq/L (ref 98–109)
CO2: 28 mEq/L (ref 22–29)
Creatinine: 0.7 mg/dL (ref 0.6–1.1)
Glucose: 97 mg/dl (ref 70–140)
POTASSIUM: 3.7 meq/L (ref 3.5–5.1)
Sodium: 140 mEq/L (ref 136–145)
Total Bilirubin: 0.52 mg/dL (ref 0.20–1.20)
Total Protein: 6.6 g/dL (ref 6.4–8.3)

## 2014-03-18 NOTE — Progress Notes (Signed)
[place]  [phone]) [phone] [phone]) [phone]     ID: [name] OB: 09-13-1971  MR#: [id number]  ELT#:[id number]  PCP: Tereasa Coop, PA-C GYN:  Mora Bellman, MD SU: Excell Seltzer, MD OTHER MD: Arloa Koh, MD  CHIEF COMPLAINT: Right breast cancer/neoadjuvant chemotherapy   BREAST CANCER HISTORY: Rhonda [name] was visiting relatives in [place] about 4 months ago when she noted pain in her right breast. In 2003 she had a right breast infection which felt pretty much the same. She was treated with antibiotics back then (while living in [place]). Accordingly, this time she minimized the symptoms. It felt like milk was coming down she says. She also had breast pain while having her period. Then in February 2015, she actually found 2 lumps in her right breast breast which she initially ignored. Eventually as the symptoms seemed to progress she saw a Dr. [place] and he told her she likely had breast cancer. She waited until coming [place] to the states to seek definitive care.  On 12/17/2013 she had bilateral diagnostic mammography in [place] [facility]). In the upper outer right breast there were pleomorphic calcifications measuring approximately 9 cm in diameter. There were 4 distinct microlobulated masses in the right breast, 3 of them in the upper outer quadrant one in the lower outer quadrant. Each measures approximately 1.2 cm. Ultrasound showed a complex cystic mass in the superior central right breast, a 4 mm hypoechoic lesion, 3 suspicious solid masses elsewhere in the right breast, all of them hypoechoic and oval are round with increased vascularity. The largest of these masses measure 1.7 cm. Findings in the left breast were not suspicious or specific. The patient was referred to the breast Center where on 01/20/2014 she underwent biopsy of 2 of the right breast masses, and a right axillary lymph node. 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  On 01/28/2014 the patient underwent bilateral breast MRI. This showed, in the right breast, multiple irregular enhancing masses in the setting of clumped non-masslike enhancement the area in question measured up to 9.4 cm. There was diffuse asymmetric skin thickening and edema involving the right breast, but no obvious nipple or pectoralis involvement. There were several enlarged and morphologically abnormal right axillary lymph node as well as inter-pectoral and subpectoral adenopathy there the largest right axillary lymph node measured 2.7 cm. There was no internal mammary lymphadenopathy noted. The left breast was unremarkable.  Her subsequent history is as detailed below.  INTERVAL HISTORY: Rhonda [name] returns alone today for followup of her locally advanced right breast carcinoma. She is currently receiving neoadjuvant chemotherapy, with today being day 8 cycle 3 of 4 planned dose dense cycles of doxorubicin/cyclophosphamide today. She receives Neulasta on day 2 for granulocyte support.  Rhonda [name] is extremely tired today, and that continues to be her biggest complaint. She has some intermittent nausea, but no emesis. She is managing to have normal bowel movements. She has noticed some increased pain in the right breast mass, but feels that it is smaller since her treatment last week.   Rhonda [name] is completing a course of amoxicillin and continues to have some pain in a right lower tooth. She tells me she is scheduled to meet with her dentist later today for further evaluation. She also completed a course of Diflucan yesterday for mild thrush which has resolved.  REVIEW OF SYSTEMS: Dorie  denies any fevers or chills but does have some hot flashes and also complains of night sweats. She's had no rashes or skin changes and denies any abnormal bruising or  bleeding. Her appetite is reduced. She denies any change in urinary habits, neither dysuria nor hematuria. She's had no increased cough, phlegm production, increased shortness of breath, peripheral swelling, chest pain, or palpitations.  She denies abnormal headaches, change in vision,  or dizziness. She's had some slight lower back pain following the Neulasta injection, but this is mild and is improving. She denies any additional pain elsewhere today.  A detailed review of systems is otherwise stable and noncontributory.   PAST MEDICAL HISTORY: Past Medical History  Diagnosis Date  . Hypertension   . Blood transfusion without reported diagnosis     3 previous   . Anemia   . Ulcer     hx of stomach ulcer a long time ago  . GERD (gastroesophageal reflux disease)     a long time ago  . Headache(784.0)   . Cancer 10/2013    right breast cancer    PAST SURGICAL HISTORY: Past Surgical History  Procedure Laterality Date  . Cesarean section      2 previous  . Back surgery  2000  . Appendectomy      a long time ago in [place]  . Portacath placement N/A 02/06/2014    Procedure: INSERTION PORT-A-CATH;  Surgeon: Edward Jolly, MD;  Location: [facility];  Service: General;  Laterality: N/A;    FAMILY HISTORY Family History  Problem Relation Age of Onset  . Hypertension Mother   . Hypertension Father    the patient's parents are living but she is not sure of their age. She had 4 brothers and 4 sisters. 2 other brothers died in the [place] and one brother is currently in [place] but they do not know where are even if he is still alive. She has one brother in this area. There is no history of breast or ovarian cancer in the family to her knowledge.  GYNECOLOGIC HISTORY: (Reviewed 03/18/2014) Menarche somewhere between the ages of 66 and 47. First live birth age 68. The patient is GX P3. Her periods are now irregular. She took birth control pills remotely for approximately one  year with no complaints.  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 Her husband Rhonda [name] is a former Emergency planning/management officer to the [place] from [place] and is a Optometrist. Their children are Rhonda [name] (11),Rhonda [name] (8) and Rhonda [name] (4).  The youngest child is currently living with Rhonda [name]. The 2 older children are currently in [place]. The patient attends a [employer] locally    ADVANCED DIRECTIVES: Not in place   HEALTH MAINTENANCE: (Updated 03/18/2014) History  Substance Use Topics  . Smoking status: Never Smoker   . Smokeless tobacco: Never Used  . Alcohol Use: No     Colonoscopy: Never  PAP: April 2015/Dr. Constant  Bone density: Never  Lipid panel: Not on file  No Known Allergies  Current Outpatient Prescriptions  Medication Sig Dispense Refill  . Alum & Mag Hydroxide-Simeth (MAGIC MOUTHWASH W/LIDOCAINE) SOLN Take 5 mLs by mouth 4 (four) times daily as needed for mouth pain.  240 mL  1  . amoxicillin (AMOXIL) 875 MG tablet Take 1 tablet (875 mg total) by mouth 2 (two) times daily. X 10 days  20 tablet  0  . dexamethasone (DECADRON)  4 MG tablet 2 tabs by mouth with food twice daily on day before and 3 days after each chemo  60 tablet  1  . hydrochlorothiazide (MICROZIDE) 12.5 MG capsule Take 12.5 mg by mouth daily as needed (headache).       . lidocaine-prilocaine (EMLA) cream Apply to port 1 -2 hr before each procedure/port access as directed  30 g  6  . loratadine (CLARITIN) 10 MG tablet Take 10 mg by mouth daily.      Marland Kitchen omeprazole (PRILOSEC) 40 MG capsule Take 1 capsule (40 mg total) by mouth daily.  30 capsule  6  . ondansetron (ZOFRAN) 8 MG tablet Take 1 tablet (8 mg total) by mouth 2 (two) times daily as needed for nausea or vomiting. Start on the third day after chemotherapy.  30 tablet  1  . prochlorperazine (COMPAZINE) 10 MG tablet 1 tab by mouth with meals and bedtime x 3 days after chemo, then 1 tab by mouth every 6 hrs if needed for nausea   30 tablet  2  . acetaminophen (TYLENOL) 325 MG tablet Take 650 mg by mouth every 6 (six) hours as needed for mild pain or headache.       . baclofen (LIORESAL) 10 MG tablet Take 10 mg by mouth every morning.       . cholecalciferol (VITAMIN D) 1000 UNITS tablet Take 1,000 Units by mouth daily.      . fluconazole (DIFLUCAN) 100 MG tablet Take 1 tablet (100 mg total) by mouth daily. X 10 days  10 tablet  0  . HYDROcodone-acetaminophen (NORCO/VICODIN) 5-325 MG per tablet Take 1-2 tablets by mouth every 4 (four) hours as needed (pain.).      Marland Kitchen LORazepam (ATIVAN) 0.5 MG tablet Take 1 tablet (0.5 mg total) by mouth at bedtime as needed for anxiety or sleep (Nausea or vomiting).  30 tablet  0   No current facility-administered medications for this visit.    OBJECTIVE:   Filed Vitals:   03/18/14 1051  BP: 110/72  Pulse: 75  Temp: 98.7 F (37.1 C)  Resp: 18     Body mass index is 27.46 kg/(m^2).    ECOG FS: 1 Middle-aged [place] American woman who appears tired but is in no acute distress. Filed Weights   03/18/14 1051  Weight: 191 lb 6.4 oz (86.818 kg)   Physical Exam: HEENT:  Sclerae anicteric.  Oropharynx  moist. Areas of hyperpigmentation on the tongue. No ulcerations.  No evidence of mucositis or oropharyngeal candidiasis.  Neck is supple, trachea midline. No thyromegaly palpated. NODES:  No cervical or supraclavicular lymphadenopathy palpated.  BREAST EXAM:   The palpable mass in the right breast appears noticeably softer than when examined last week, but with minimal  change in the size since her last exam. The mass is movable. There are no obvious skin changes or erythema, and no nipple discharge. There is palpable adenopathy in the right axilla. Left axilla is unremarkable. LUNGS:  Clear to auscultation bilaterally.  No crackles, wheezes or rhonchi HEART:  Regular rate and rhythm. No murmur appreciated. ABDOMEN:  Soft, nontender. No organomegaly or masses palpated. Positive bowel  sounds.  MSK:  No focal spinal tenderness to palpation. Full range of motion bilaterally in the upper extremities. EXTREMITIES:  No peripheral edema.  No clubbing or cyanosis.  No lymphedema in the right upper extremity.  SKIN:  No visible rashes. No excessive ecchymoses. No petechiae. Good skin turgor. Skin is warm and dry. Port is  intact in the left upper chest wall with no erythema or edema, and no evidence of infection/cellulitis. NEURO:  Nonfocal. Well oriented. Appropriate  affect.    LAB RESULTS:   Lab Results  Component Value Date   WBC 3.8* 03/18/2014   NEUTROABS 3.0 03/18/2014   HGB 10.3* 03/18/2014   HCT 32.8* 03/18/2014   MCV 69.8* 03/18/2014   PLT 156 03/18/2014      Chemistry      Component Value Date/Time   NA 140 03/18/2014 1045   NA 140 02/17/2014 1527   K 3.7 03/18/2014 1045   K 4.2 02/17/2014 1527   CL 101 02/17/2014 1527   CO2 28 03/18/2014 1045   CO2 28 02/17/2014 1527   BUN 10.4 03/18/2014 1045   BUN 16 02/17/2014 1527   CREATININE 0.7 03/18/2014 1045   CREATININE 0.78 02/17/2014 1527      Component Value Date/Time   CALCIUM 9.4 03/18/2014 1045   CALCIUM 9.3 02/17/2014 1527   ALKPHOS 98 03/18/2014 1045   ALKPHOS 83 02/17/2014 1527   AST 15 03/18/2014 1045   AST 14 02/17/2014 1527   ALT 22 03/18/2014 1045   ALT 19 02/17/2014 1527   BILITOT 0.52 03/18/2014 1045   BILITOT 1.2 02/17/2014 1527      STUDIES:  Ct Abdomen Pelvis W Contrast 03/04/2014   CLINICAL DATA:  Breast cancer  EXAM: CT ABDOMEN AND PELVIS WITH CONTRAST  TECHNIQUE: Multidetector CT imaging of the abdomen and pelvis was performed using the standard protocol following bolus administration of intravenous contrast.  CONTRAST:  132m OMNIPAQUE IOHEXOL 300 MG/ML  SOLN  COMPARISON:  None.  FINDINGS: The lung bases are clear.  There is no liver abnormality identified. The gallbladder appears normal. No biliary dilatation. Normal appearance of the pancreas. The spleen is unremarkable.  Normal appearance of both  adrenal glands. The right kidney appears normal. The left kidney is normal. Urinary bladder appears within normal limits. The uterus and adnexal structures have a normal physiologic appearance.  Normal caliber of the abdominal aorta. There is no aneurysm. There is no pelvic or inguinal adenopathy identified.  The stomach is within normal limits. The small bowel loops are normal. Normal appearance of the colon.  There is no free fluid or abnormal fluid collections identified. No peritoneal nodule or mass identified.  Review of the visualized osseous structures is significant for mild lumbar spondylosis. There is no aggressive lytic or sclerotic bone lesions.  IMPRESSION: 1. No acute findings and no evidence for metastatic disease within the abdomen or pelvis.   Electronically Signed   By: TKerby MoorsM.D.   On: 03/04/2014 08:15    CT Chest W Contrast 02/10/2014  CLINICAL DATA: Breast cancer.  EXAM:  CT CHEST WITH CONTRAST  TECHNIQUE:  Multidetector CT imaging of the chest was performed during  intravenous contrast administration.  CONTRAST: 878mOMNIPAQUE IOHEXOL 300 MG/ML SOLN  COMPARISON: Breast MRI 01/27/2014  FINDINGS:  Examination was chest wall demonstrates a left-sided Port-A-Cath in  position. No complicating features. The right breast demonstrates  marked skin thickening and diffuse sub areolar tumor with multiple  nodules or intramammary lymph nodes. There is also bulky axillary  and subpectoral adenopathy. The largest axillary lymph node on image  number 19 measures 29 x 16.5 mm.  The bony thorax is intact. No destructive or sclerotic bone lesions.  The heart is normal in size. No pericardial effusion. No mediastinal  or hilar mass or lymphadenopathy. The aorta is normal in caliber.  The esophagus is grossly normal.  Examination of the lung parenchyma demonstrates no acute pulmonary  findings. No worrisome pulmonary lesions to suggest pulmonary  metastatic disease. No pleural  effusion.  The upper abdomen is unremarkable.  IMPRESSION:  1. Extensive right-sided breast disease as discussed above and as  seen on the prior breast MRI. There is bulky right axillary and  subpectoral adenopathy.  2. No findings for pulmonary or osseous metastatic disease.  Electronically Signed  By: Kalman Jewels M.D.  On: 02/10/2014 15:47    Mr Breast Bilateral W Wo Contrast 01/28/2014   CLINICAL DATA:  43 year old female with recently diagnosed 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  LABS:  Most recent serum creatinine 0.7 milligrams/deciliter.  EXAM: BILATERAL BREAST MRI WITH AND WITHOUT CONTRAST  TECHNIQUE: Multiplanar, multisequence MR images of both breasts were obtained prior to and following the intravenous administration of 2m of MultiHance.  THREE-DIMENSIONAL MR IMAGE RENDERING ON INDEPENDENT WORKSTATION:  Three-dimensional MR images were rendered by post-processing of the original MR data on an independent workstation. The three-dimensional MR images were interpreted, and findings are reported in the following complete MRI report for this study. Three dimensional images were evaluated at the independent DynaCad workstation  COMPARISON:  Previous exams  FINDINGS: Breast composition: c:  Heterogeneous fibroglandular tissue  Background parenchymal enhancement: Moderate  Right breast: Multiple irregular enhancing masses with associated contiguous and surrounding areas of clumped non mass enhancement are seen throughout the central, outer, and upper-outer right breast compatible with known biopsy proven malignancy. This measures up to 9.4 cm in greatest AP dimension, approximately 5 cm transverse, and up to 7 cm craniocaudal. There is diffuse asymmetric skin thickening as well as edema involving the right breast. No obvious nipple involvement. No pectoralis involvement.  Left breast: No  suspicious rapidly enhancing masses or abnormal areas of enhancement in the left breast to suggest malignancy. A small oval circumscribed mass in the upper-outer left breast measuring 0.5 x 0.6 x 0.6 cm corresponds with a benign appearing lymph node seen on the recent outside ultrasound.  Lymph nodes: Several enlarged and morphologically abnormal right axillary, interpectoral, and subpectoral lymph nodes are identified compatible with known biopsy proven axillary metastases, with a representative enlarged right axillary lymph node measuring up to 2.7 cm AP. No internal mammary lymphadenopathy seen.  Ancillary findings:  None.  IMPRESSION: Extensive malignancy involving predominately the central, outer, and upper-outer right breast measuring up to 9.4 cm in greatest AP dimension with associated right axillary (biopsy proven), interpectoral, and subpectoral lymphadenopathy.  RECOMMENDATION: Treatment for known biopsy proven malignancy in the right breast.  BI-RADS CATEGORY  6: Known biopsy-proven malignancy.   Electronically Signed   By: JEverlean AlstromM.D.   On: 01/28/2014 09:25       ASSESSMENT: 43y.o. [place]  (1)   status post right breast and right axillary lymph node biopsy 01/20/2014, both positive for a clinically T3 N1-2, stage III a invasive ductal carcinoma, grade 2, estrogen and progesterone receptor negative, HER-2 positive, with an MIB-1 of 85%  (2)  to be treated in the neoadjuvant setting, the plan being to proceed with 4 dose dense cycles of doxorubicin/cyclophosphamide (first treatment 02/11/2014), with Neulasta on day 2 for granulocyte support. This is to be followed by weekly paclitaxel x12, given along with trastuzumab/pertuzumab every 3 weeks prior to definitive surgery.   (  3)  Patient will likely need a right modified radical mastectomy, followed by postmastectomy radiation.   (4)  trastuzumab will be continued every 3 weeks for total of one  year.  PLAN: Angeliah continues to tolerate treatment reasonably well, and is scheduled for her fourth and final dose of neoadjuvant doxorubicin/cyclophosphamide next week on June 30. She will have a repeat breast MRI on July 8 after completing all 4 cycles of dose dense doxorubicin/cyclophosphamide to assess response to therapy thus far. She will then be initiating 12 weekly paclitaxel given along with q. three-week trastuzumab/pertuzumab, the first cycle of which is scheduled to begin on July 14. She will see Dr. Jana Hakim that day to review her MRI results, her new treatment regimen, and the necessary anti-emetics.  As noted above, Manju is also seeing a dentist later this afternoon for pain in a right molar, and I have sent today's lab results along with our business card with a request for the dentist to call us if any invasive procedures are necessary. Certainly if there is any sign of abscess or infection, she will likely need to continue on prophylactic antibiotics.  The above plan was reviewed in detail with Rhonda [name] and she voices her understanding and agreement with this plan. We will continue to follow Neyla very closely, and she knows to call with any changes or problems prior to her next appointment.  I will mention that her two oldest children are still in [place]. She is hoping that her husband will be able to bring them to [facility] soon, but there is a possibility she might have to travel to [place] herself to pick them up.  Of course if this becomes necessary, we will adjust her schedule as necessary. Otherwise, we'll plan on seeing her on a weekly basis.   BERRY,AMY, PA-C   03/18/2014 12:52 PM

## 2014-03-18 NOTE — Telephone Encounter (Signed)
PT GIVEN NEW SCHEDULE - NO NEW POF SENT. PER AB PT JUST NEEDS SCHEDULE.

## 2014-03-19 ENCOUNTER — Other Ambulatory Visit: Payer: Self-pay | Admitting: Physician Assistant

## 2014-03-25 ENCOUNTER — Encounter: Payer: Self-pay | Admitting: Adult Health

## 2014-03-25 ENCOUNTER — Other Ambulatory Visit (HOSPITAL_BASED_OUTPATIENT_CLINIC_OR_DEPARTMENT_OTHER): Payer: Medicaid Other

## 2014-03-25 ENCOUNTER — Other Ambulatory Visit: Payer: Self-pay | Admitting: Internal Medicine

## 2014-03-25 ENCOUNTER — Ambulatory Visit (HOSPITAL_BASED_OUTPATIENT_CLINIC_OR_DEPARTMENT_OTHER): Payer: Medicaid Other

## 2014-03-25 ENCOUNTER — Other Ambulatory Visit: Payer: Self-pay | Admitting: *Deleted

## 2014-03-25 ENCOUNTER — Ambulatory Visit (HOSPITAL_BASED_OUTPATIENT_CLINIC_OR_DEPARTMENT_OTHER): Payer: Medicaid Other | Admitting: Adult Health

## 2014-03-25 VITALS — BP 128/77 | HR 70 | Temp 98.3°F | Resp 18 | Ht 70.0 in | Wt 190.4 lb

## 2014-03-25 DIAGNOSIS — D509 Iron deficiency anemia, unspecified: Secondary | ICD-10-CM

## 2014-03-25 DIAGNOSIS — C50411 Malignant neoplasm of upper-outer quadrant of right female breast: Secondary | ICD-10-CM

## 2014-03-25 DIAGNOSIS — R110 Nausea: Secondary | ICD-10-CM

## 2014-03-25 DIAGNOSIS — C773 Secondary and unspecified malignant neoplasm of axilla and upper limb lymph nodes: Secondary | ICD-10-CM

## 2014-03-25 DIAGNOSIS — C50419 Malignant neoplasm of upper-outer quadrant of unspecified female breast: Secondary | ICD-10-CM

## 2014-03-25 DIAGNOSIS — Z171 Estrogen receptor negative status [ER-]: Secondary | ICD-10-CM

## 2014-03-25 DIAGNOSIS — Z5111 Encounter for antineoplastic chemotherapy: Secondary | ICD-10-CM

## 2014-03-25 DIAGNOSIS — Z452 Encounter for adjustment and management of vascular access device: Secondary | ICD-10-CM

## 2014-03-25 DIAGNOSIS — D649 Anemia, unspecified: Secondary | ICD-10-CM

## 2014-03-25 LAB — COMPREHENSIVE METABOLIC PANEL (CC13)
ALK PHOS: 85 U/L (ref 40–150)
ALT: 32 U/L (ref 0–55)
AST: 19 U/L (ref 5–34)
Albumin: 3.2 g/dL — ABNORMAL LOW (ref 3.5–5.0)
Anion Gap: 7 mEq/L (ref 3–11)
BILIRUBIN TOTAL: 0.24 mg/dL (ref 0.20–1.20)
BUN: 12.8 mg/dL (ref 7.0–26.0)
CO2: 28 mEq/L (ref 22–29)
CREATININE: 0.8 mg/dL (ref 0.6–1.1)
Calcium: 9.3 mg/dL (ref 8.4–10.4)
Chloride: 108 mEq/L (ref 98–109)
GLUCOSE: 97 mg/dL (ref 70–140)
Potassium: 4.2 mEq/L (ref 3.5–5.1)
Sodium: 143 mEq/L (ref 136–145)
Total Protein: 6.9 g/dL (ref 6.4–8.3)

## 2014-03-25 LAB — CBC WITH DIFFERENTIAL/PLATELET
BASO%: 0.4 % (ref 0.0–2.0)
BASOS ABS: 0 10*3/uL (ref 0.0–0.1)
EOS ABS: 0 10*3/uL (ref 0.0–0.5)
EOS%: 0 % (ref 0.0–7.0)
HCT: 33.4 % — ABNORMAL LOW (ref 34.8–46.6)
HEMOGLOBIN: 10.3 g/dL — AB (ref 11.6–15.9)
LYMPH%: 25.6 % (ref 14.0–49.7)
MCH: 22 pg — AB (ref 25.1–34.0)
MCHC: 31 g/dL — ABNORMAL LOW (ref 31.5–36.0)
MCV: 71 fL — ABNORMAL LOW (ref 79.5–101.0)
MONO#: 0.7 10*3/uL (ref 0.1–0.9)
MONO%: 13.1 % (ref 0.0–14.0)
NEUT%: 60.9 % (ref 38.4–76.8)
NEUTROS ABS: 3.3 10*3/uL (ref 1.5–6.5)
PLATELETS: 210 10*3/uL (ref 145–400)
RBC: 4.7 10*6/uL (ref 3.70–5.45)
RDW: 16.6 % — AB (ref 11.2–14.5)
WBC: 5.5 10*3/uL (ref 3.9–10.3)
lymph#: 1.4 10*3/uL (ref 0.9–3.3)

## 2014-03-25 MED ORDER — DEXAMETHASONE SODIUM PHOSPHATE 20 MG/5ML IJ SOLN
INTRAMUSCULAR | Status: AC
  Filled 2014-03-25: qty 5

## 2014-03-25 MED ORDER — HEPARIN SOD (PORK) LOCK FLUSH 100 UNIT/ML IV SOLN
500.0000 [IU] | Freq: Once | INTRAVENOUS | Status: AC | PRN
Start: 2014-03-25 — End: 2014-03-25
  Administered 2014-03-25: 500 [IU]
  Filled 2014-03-25: qty 5

## 2014-03-25 MED ORDER — PALONOSETRON HCL INJECTION 0.25 MG/5ML
INTRAVENOUS | Status: AC
  Filled 2014-03-25: qty 5

## 2014-03-25 MED ORDER — ALTEPLASE 2 MG IJ SOLR
2.0000 mg | Freq: Once | INTRAMUSCULAR | Status: AC | PRN
  Administered 2014-03-25: 2 mg
  Filled 2014-03-25: qty 2

## 2014-03-25 MED ORDER — PALONOSETRON HCL INJECTION 0.25 MG/5ML
0.2500 mg | Freq: Once | INTRAVENOUS | Status: AC
  Administered 2014-03-25: 0.25 mg via INTRAVENOUS

## 2014-03-25 MED ORDER — PROCHLORPERAZINE MALEATE 10 MG PO TABS: 10.0000 mg | ORAL_TABLET | Freq: Once | ORAL | Status: DC

## 2014-03-25 MED ORDER — DEXAMETHASONE SODIUM PHOSPHATE 20 MG/5ML IJ SOLN
12.0000 mg | Freq: Once | INTRAMUSCULAR | Status: AC
  Administered 2014-03-25: 12 mg via INTRAVENOUS

## 2014-03-25 MED ORDER — SODIUM CHLORIDE 0.9 % IV SOLN
600.0000 mg/m2 | Freq: Once | INTRAVENOUS | Status: DC
  Filled 2014-03-25: qty 60

## 2014-03-25 MED ORDER — FERROUS SULFATE 325 (65 FE) MG PO TBEC: 325.0000 mg | DELAYED_RELEASE_TABLET | Freq: Every day | ORAL | Status: DC

## 2014-03-25 MED ORDER — SODIUM CHLORIDE 0.9 % IV SOLN
Freq: Once | INTRAVENOUS | Status: AC
  Administered 2014-03-25: 12:00:00 via INTRAVENOUS

## 2014-03-25 MED ORDER — DOXORUBICIN HCL CHEMO IV INJECTION 2 MG/ML
60.0000 mg/m2 | Freq: Once | INTRAVENOUS | Status: AC
  Administered 2014-03-25: 120 mg via INTRAVENOUS
  Filled 2014-03-25: qty 60

## 2014-03-25 MED ORDER — SODIUM CHLORIDE 0.9 % IJ SOLN
10.0000 mL | INTRAMUSCULAR | Status: DC | PRN
  Administered 2014-03-25: 10 mL
  Filled 2014-03-25: qty 10

## 2014-03-25 MED ORDER — SODIUM CHLORIDE 0.9 % IV SOLN
150.0000 mg | Freq: Once | INTRAVENOUS | Status: AC
  Administered 2014-03-25: 150 mg via INTRAVENOUS
  Filled 2014-03-25: qty 5

## 2014-03-25 NOTE — Patient Instructions (Signed)
[facility] Discharge Instructions for Patients Receiving Chemotherapy  Today you received the following chemotherapy agents:  Adriamycin and Cytoxan  To help prevent nausea and vomiting after your treatment, we encourage you to take your nausea medication as ordered per MD.   If you develop nausea and vomiting that is not controlled by your nausea medication, call the clinic.   BELOW ARE SYMPTOMS THAT SHOULD BE REPORTED IMMEDIATELY:  *FEVER GREATER THAN 100.5 F  *CHILLS WITH OR WITHOUT FEVER  NAUSEA AND VOMITING THAT IS NOT CONTROLLED WITH YOUR NAUSEA MEDICATION  *UNUSUAL SHORTNESS OF BREATH  *UNUSUAL BRUISING OR BLEEDING  TENDERNESS IN MOUTH AND THROAT WITH OR WITHOUT PRESENCE OF ULCERS  *URINARY PROBLEMS  *BOWEL PROBLEMS  UNUSUAL RASH Items with * indicate a potential emergency and should be followed up as soon as possible.  Feel free to call the clinic you have any questions or concerns. The clinic phone number is [phone].    

## 2014-03-25 NOTE — Progress Notes (Signed)
Pt here for chemo after office visit with Ria Comment, NP.  Left chest portacath was accessed by Lorriane Shire, RN without difficulty.  Port flushed easily with normal saline but no blood return.  Despite repositioning, unable to obtain blood return from port.  Alteplase 2mg  instilled per protocol.   After almost an hour, still unable to obtain blood return.  Pt complained of slight discomfort - not pain at port site.  OK to deaccess pt without removing Alteplase per Vista Lawman, Pharmacist, community. Left chest portacath reaccessed with 20 x1  HN without difficulty.  Flushed easily with normal saline but no blood return.  Lisabeth Register, NP notified of above situation.  Per NP, pt to be sent for dye study to check for port patency. Explanations given to pt.   Was informed by Val, desk nurse that dye study will be done on Wed 03/26/14 due to radiology has no availability today. OK per Ria Comment, NP to start peripheral IV for chemo today.  Explanations given to pt;  Pt voiced understanding. 1428 -  Pt was stable at discharge by self via ambulation.  Discharge instructions given to pt.

## 2014-03-25 NOTE — Progress Notes (Signed)
Called IR, left message requesting earlier appt for dye study on 03/26/14. Unable to administer A/C treatment peripherally today. Will reschedule chemotherapy to 7/1 with injection 03/27/14. Val, RN with Dr. Jana Hakim made aware.

## 2014-03-25 NOTE — Progress Notes (Signed)
Pt did see Dentist on 6/23 to evaluate right molar pain. She was seen at [facility]. I called this office today for them to send the OV notes and the medical clearance letter to Dr. Jana Hakim.

## 2014-03-25 NOTE — Progress Notes (Signed)
[place]  [phone]) [phone] [phone]) [phone]     ID: Rhonda [name] OB: 1970/09/30  Rhonda [name]  MBT#:[id number]  PCP: Tereasa Coop, PA-C GYN:  Mora Bellman, MD SU: Excell Seltzer, MD OTHER MD: Arloa Koh, MD  CHIEF COMPLAINT: Right breast cancer/neoadjuvant chemotherapy   BREAST CANCER HISTORY: Rhonda [name] was visiting relatives in [place] about 4 months ago when she noted pain in her right breast. In 2003 she had a right breast infection which felt pretty much the same. She was treated with antibiotics back then (while living in [place]). Accordingly, this time she minimized the symptoms. It felt like milk was coming down she says. She also had breast pain while having her period. Then in February 2015, she actually found 2 lumps in her right breast breast which she initially ignored. Eventually as the symptoms seemed to progress she saw a Dr. [place] and he told her she likely had breast cancer. She waited until coming [place] the [place] to seek definitive care.  On 12/17/2013 she had bilateral diagnostic mammography in [facility]). In the upper outer right breast there were pleomorphic calcifications measuring approximately 9 cm in diameter. There were 4 distinct microlobulated masses in the right breast, 3 of them in the upper outer quadrant one in the lower outer quadrant. Each measures approximately 1.2 cm. Ultrasound showed a complex cystic mass in the superior central right breast, a 4 mm hypoechoic lesion, 3 suspicious solid masses elsewhere in the right breast, all of them hypoechoic and oval are round with increased vascularity. The largest of these masses measure 1.7 cm. Findings in the left breast were not suspicious or specific. The patient was referred to the breast Center where on 01/20/2014 she underwent biopsy of 2 of the right breast masses, and a right axillary lymph node. 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  On 01/28/2014 the patient underwent bilateral breast MRI. This showed, in the right breast, multiple irregular enhancing masses in the setting of clumped non-masslike enhancement the area in question measured up to 9.4 cm. There was diffuse asymmetric skin thickening and edema involving the right breast, but no obvious nipple or pectoralis involvement. There were several enlarged and morphologically abnormal right axillary lymph node as well as inter-pectoral and subpectoral adenopathy there the largest right axillary lymph node measured 2.7 cm. There was no internal mammary lymphadenopathy noted. The left breast was unremarkable.  Her subsequent history is as detailed below.  INTERVAL HISTORY: Magally returns alone today for followup of her locally advanced right breast carcinoma. She is currently receiving neoadjuvant chemotherapy, with today being day 1 cycle 4 of 4 planned dose dense cycles of doxorubicin/cyclophosphamide today. She receives Neulasta on day 2 for granulocyte support.  She is doing well today.  She is not very excited to get her fourth cycle of treatment.  She did have a tooth abscess and was evaluated by the dentist.  She tells me that they want to do a procedure to the tooth, either by extraction or another procedure.  She does have taste changes and a sore mouth that is relieved with Magic mouthwash.  She is drinking carrot juice and did notice some loose bowel movements after this.  Otherwise, she denies fevers, chills , nausea, vomiting, constipation, numbness/tingling, skin changes, or any further concerns.  REVIEW OF SYSTEMS: A 10 point review of systems was conducted and is otherwise negative except for what is noted above.    PAST MEDICAL HISTORY: Past Medical History  Diagnosis Date  . Hypertension   . Blood transfusion  without reported diagnosis     3 previous   . Anemia   . Ulcer     hx of stomach ulcer a long time ago  . GERD (gastroesophageal reflux disease)     a long time ago  . Headache(784.0)   . Cancer 10/2013    right breast cancer    PAST SURGICAL HISTORY: Past Surgical History  Procedure Laterality Date  . Cesarean section      2 previous  . Back surgery  2000  . Appendectomy      a long time ago in [place]  . Portacath placement N/A 02/06/2014    Procedure: INSERTION PORT-A-CATH;  Surgeon: Edward Jolly, MD;  Location: [facility];  Service: General;  Laterality: N/A;    FAMILY HISTORY Family History  Problem Relation Age of Onset  . Hypertension Mother   . Hypertension Father    the patient's parents are living but she is not sure of their age. She had 4 brothers and 4 sisters. 2 other brothers died in the [place] and one brother is currently in [place] but they do not know where are even if he is still alive. She has one brother in this area. There is no history of breast or ovarian cancer in the family to her knowledge.  GYNECOLOGIC HISTORY: (Reviewed 03/18/2014) Menarche somewhere between the ages of 62 and 43. First live birth age 59. The patient is GX P3. Her periods are now irregular. She took birth control pills remotely for approximately one year with no complaints.  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 Her husband Jacquenette Shone Lol Dalene Carrow is a former Emergency planning/management officer to the [place] from [place] and is a Optometrist. Their children are Duopl (11),Nyamal (8) and Nyewech (4).  The youngest child is currently living with Apolonio Schneiders. The 2 older children are currently in [place]. The patient attends a [employer] locally    ADVANCED DIRECTIVES: Not in place   HEALTH MAINTENANCE: (Updated 03/18/2014) History  Substance Use Topics  . Smoking status: Never Smoker   . Smokeless tobacco: Never Used  . Alcohol Use: No     Colonoscopy:  Never  PAP: April 2015/Dr. Constant  Bone density: Never  Lipid panel: Not on file  No Known Allergies  Current Outpatient Prescriptions  Medication Sig Dispense Refill  . Alum & Mag Hydroxide-Simeth (MAGIC MOUTHWASH W/LIDOCAINE) SOLN Take 5 mLs by mouth 4 (four) times daily as needed for mouth pain.  240 mL  1  . baclofen (LIORESAL) 10 MG tablet Take 10 mg by mouth every morning.       . cholecalciferol (VITAMIN D) 1000 UNITS tablet Take 1,000 Units by mouth daily.      . hydrochlorothiazide (MICROZIDE) 12.5 MG capsule Take 12.5 mg by mouth daily as needed (headache).       Marland Kitchen omeprazole (PRILOSEC) 40 MG capsule Take 1 capsule (40 mg total) by mouth daily.  30 capsule  6  . acetaminophen (TYLENOL) 325 MG tablet Take 650 mg by mouth every 6 (six) hours as needed for mild pain or headache.       . dexamethasone (DECADRON) 4 MG tablet 2 tabs by mouth with food twice daily on day  before and 3 days after each chemo  60 tablet  1  . ferrous sulfate 325 (65 FE) MG EC tablet Take 1 tablet (325 mg total) by mouth daily with breakfast.  30 tablet  3  . HYDROcodone-acetaminophen (NORCO/VICODIN) 5-325 MG per tablet Take 1-2 tablets by mouth every 4 (four) hours as needed (pain.).      Marland Kitchen lidocaine-prilocaine (EMLA) cream Apply to port 1 -2 hr before each procedure/port access as directed  30 g  6  . loratadine (CLARITIN) 10 MG tablet Take 10 mg by mouth daily.      Marland Kitchen LORazepam (ATIVAN) 0.5 MG tablet Take 1 tablet (0.5 mg total) by mouth at bedtime as needed for anxiety or sleep (Nausea or vomiting).  30 tablet  0  . ondansetron (ZOFRAN) 8 MG tablet Take 1 tablet (8 mg total) by mouth 2 (two) times daily as needed for nausea or vomiting. Start on the third day after chemotherapy.  30 tablet  1  . prochlorperazine (COMPAZINE) 10 MG tablet 1 tab by mouth with meals and bedtime x 3 days after chemo, then 1 tab by mouth every 6 hrs if needed for nausea  30 tablet  2   No current facility-administered  medications for this visit.    OBJECTIVE:   Filed Vitals:   03/25/14 0849  BP: 128/77  Pulse: 70  Temp: 98.3 F (36.8 C)  Resp: 18     Body mass index is 27.32 kg/(m^2).    ECOG FS: 1 Middle-aged [place] American woman who appears tired but is in no acute distress. Filed Weights   03/25/14 0849  Weight: 190 lb 6.4 oz (86.365 kg)   Physical Exam: HEENT:  Sclerae anicteric.  Oropharynx  moist. Areas of hyperpigmentation on the tongue. No ulcerations.  No evidence of mucositis or oropharyngeal candidiasis.  Neck is supple, trachea midline. No thyromegaly palpated. NODES:  No cervical or supraclavicular lymphadenopathy palpated.  BREAST EXAM:   The palpable mass in the right breast appears noticeably softer than when examined last week, but with minimal  change in the size since her last exam. The mass is movable. There are no obvious skin changes or erythema, and no nipple discharge. There is palpable adenopathy in the right axilla. Left axilla is unremarkable. LUNGS:  Clear to auscultation bilaterally.  No crackles, wheezes or rhonchi HEART:  Regular rate and rhythm. No murmur appreciated. ABDOMEN:  Soft, nontender. No organomegaly or masses palpated. Positive bowel sounds.  MSK:  No focal spinal tenderness to palpation. Full range of motion bilaterally in the upper extremities. EXTREMITIES:  No peripheral edema.  No clubbing or cyanosis.  No lymphedema in the right upper extremity.  SKIN:  No visible rashes. No excessive ecchymoses. No petechiae. Good skin turgor. Skin is warm and dry. Port is intact in the left upper chest wall with no erythema or edema, and no evidence of infection/cellulitis. NEURO:  Nonfocal. Well oriented. Appropriate  affect.    LAB RESULTS:   Lab Results  Component Value Date   WBC 5.5 03/25/2014   NEUTROABS 3.3 03/25/2014   HGB 10.3* 03/25/2014   HCT 33.4* 03/25/2014   MCV 71.0* 03/25/2014   PLT 210 03/25/2014      Chemistry      Component Value  Date/Time   NA 143 03/25/2014 0833   NA 140 02/17/2014 1527   K 4.2 03/25/2014 0833   K 4.2 02/17/2014 1527   CL 101 02/17/2014 1527   CO2 28 03/25/2014 3009  CO2 28 02/17/2014 1527   BUN 12.8 03/25/2014 0833   BUN 16 02/17/2014 1527   CREATININE 0.8 03/25/2014 0833   CREATININE 0.78 02/17/2014 1527      Component Value Date/Time   CALCIUM 9.3 03/25/2014 0833   CALCIUM 9.3 02/17/2014 1527   ALKPHOS 85 03/25/2014 0833   ALKPHOS 83 02/17/2014 1527   AST 19 03/25/2014 0833   AST 14 02/17/2014 1527   ALT 32 03/25/2014 0833   ALT 19 02/17/2014 1527   BILITOT 0.24 03/25/2014 0833   BILITOT 1.2 02/17/2014 1527      STUDIES:  Ct Abdomen Pelvis W Contrast 03/04/2014   CLINICAL DATA:  Breast cancer  EXAM: CT ABDOMEN AND PELVIS WITH CONTRAST  TECHNIQUE: Multidetector CT imaging of the abdomen and pelvis was performed using the standard protocol following bolus administration of intravenous contrast.  CONTRAST:  134m OMNIPAQUE IOHEXOL 300 MG/ML  SOLN  COMPARISON:  None.  FINDINGS: The lung bases are clear.  There is no liver abnormality identified. The gallbladder appears normal. No biliary dilatation. Normal appearance of the pancreas. The spleen is unremarkable.  Normal appearance of both adrenal glands. The right kidney appears normal. The left kidney is normal. Urinary bladder appears within normal limits. The uterus and adnexal structures have a normal physiologic appearance.  Normal caliber of the abdominal aorta. There is no aneurysm. There is no pelvic or inguinal adenopathy identified.  The stomach is within normal limits. The small bowel loops are normal. Normal appearance of the colon.  There is no free fluid or abnormal fluid collections identified. No peritoneal nodule or mass identified.  Review of the visualized osseous structures is significant for mild lumbar spondylosis. There is no aggressive lytic or sclerotic bone lesions.  IMPRESSION: 1. No acute findings and no evidence for metastatic disease  within the abdomen or pelvis.   Electronically Signed   By: TKerby MoorsM.D.   On: 03/04/2014 08:15    CT Chest W Contrast 02/10/2014  CLINICAL DATA: Breast cancer.  EXAM:  CT CHEST WITH CONTRAST  TECHNIQUE:  Multidetector CT imaging of the chest was performed during  intravenous contrast administration.  CONTRAST: 878mOMNIPAQUE IOHEXOL 300 MG/ML SOLN  COMPARISON: Breast MRI 01/27/2014  FINDINGS:  Examination was chest wall demonstrates a left-sided Port-A-Cath in  position. No complicating features. The right breast demonstrates  marked skin thickening and diffuse sub areolar tumor with multiple  nodules or intramammary lymph nodes. There is also bulky axillary  and subpectoral adenopathy. The largest axillary lymph node on image  number 19 measures 29 x 16.5 mm.  The bony thorax is intact. No destructive or sclerotic bone lesions.  The heart is normal in size. No pericardial effusion. No mediastinal  or hilar mass or lymphadenopathy. The aorta is normal in caliber.  The esophagus is grossly normal.  Examination of the lung parenchyma demonstrates no acute pulmonary  findings. No worrisome pulmonary lesions to suggest pulmonary  metastatic disease. No pleural effusion.  The upper abdomen is unremarkable.  IMPRESSION:  1. Extensive right-sided breast disease as discussed above and as  seen on the prior breast MRI. There is bulky right axillary and  subpectoral adenopathy.  2. No findings for pulmonary or osseous metastatic disease.  Electronically Signed  By: MaKalman Jewels.D.  On: 02/10/2014 15:47    Mr Breast Bilateral W Wo Contrast 01/28/2014   CLINICAL DATA:  [age]ear old female with recently diagnosed invasive ductal carcinoma in the right breast (2 sites of malignancy biopsied  which span 10 cm apart) as well as axillary lymph node metastases.  LABS:  Most recent serum creatinine 0.7 milligrams/deciliter.  EXAM: BILATERAL BREAST MRI WITH AND WITHOUT CONTRAST  TECHNIQUE:  Multiplanar, multisequence MR images of both breasts were obtained prior to and following the intravenous administration of 64m of MultiHance.  THREE-DIMENSIONAL MR IMAGE RENDERING ON INDEPENDENT WORKSTATION:  Three-dimensional MR images were rendered by post-processing of the original MR data on an independent workstation. The three-dimensional MR images were interpreted, and findings are reported in the following complete MRI report for this study. Three dimensional images were evaluated at the independent DynaCad workstation  COMPARISON:  Previous exams  FINDINGS: Breast composition: c:  Heterogeneous fibroglandular tissue  Background parenchymal enhancement: Moderate  Right breast: Multiple irregular enhancing masses with associated contiguous and surrounding areas of clumped non mass enhancement are seen throughout the central, outer, and upper-outer right breast compatible with known biopsy proven malignancy. This measures up to 9.4 cm in greatest AP dimension, approximately 5 cm transverse, and up to 7 cm craniocaudal. There is diffuse asymmetric skin thickening as well as edema involving the right breast. No obvious nipple involvement. No pectoralis involvement.  Left breast: No suspicious rapidly enhancing masses or abnormal areas of enhancement in the left breast to suggest malignancy. A small oval circumscribed mass in the upper-outer left breast measuring 0.5 x 0.6 x 0.6 cm corresponds with a benign appearing lymph node seen on the recent outside ultrasound.  Lymph nodes: Several enlarged and morphologically abnormal right axillary, interpectoral, and subpectoral lymph nodes are identified compatible with known biopsy proven axillary metastases, with a representative enlarged right axillary lymph node measuring up to 2.7 cm AP. No internal mammary lymphadenopathy seen.  Ancillary findings:  None.  IMPRESSION: Extensive malignancy involving predominately the central, outer, and upper-outer right breast  measuring up to 9.4 cm in greatest AP dimension with associated right axillary (biopsy proven), interpectoral, and subpectoral lymphadenopathy.  RECOMMENDATION: Treatment for known biopsy proven malignancy in the right breast.  BI-RADS CATEGORY  6: Known biopsy-proven malignancy.   Electronically Signed   By: JEverlean AlstromM.D.   On: 01/28/2014 09:25       ASSESSMENT: 43y.o. [place]  (1)   status post right breast and right axillary lymph node biopsy 01/20/2014, both positive for a clinically T3 N1-2, clinical stage III a invasive ductal carcinoma, grade 2, estrogen and progesterone receptor negative, HER-2 positive, with an MIB-1 of 85%  (2)  to be treated in the neoadjuvant setting, the plan being to proceed with 4 dose dense cycles of doxorubicin/cyclophosphamide (first treatment 02/11/2014), with Neulasta on day 2 for granulocyte support. This is to be followed by weekly paclitaxel x12, given along with trastuzumab/pertuzumab every 3 weeks prior to definitive surgery.   (3)  Patient will likely need a right modified radical mastectomy, followed by postmastectomy radiation.   (4)  trastuzumab will be continued every 3 weeks for total of one year.  PLAN:  RMisheelis doing well today.  Her labs are stable.  I reviewed these with her in detail.  She will proceed with chemotherapy today.    She continues to have microcytic anemia.  I prescribed an iron supplement for her to take and gave her a handout on iron rich foods to eat.    We are working on getting records from her dentist so we can coordinate a date and whether or not she needs to go on prophylactic antibiotics.  Her last echocardiogram was on 02/05/2014 and demonstrated a LVEF of 60%.  I discussed referral to Dr. Haroldine Laws to Dr. Aundra Dubin in the cardio-onc clinic and placed the referral today.    Patient has two children in [place], and one child that is here.  Her husband is a Optometrist and  cannot help the patient with her 15 year old daughter who is here.  She has had difficulty with child care and believes that she may have to take a trip to [place] to leave her daughter with her two other children and her mom until she completes her treatment.  She tells me that she may need a month or two off of treatment to do this.  I will contact our social workers to talk about this.    The patient will return tomorrow for Neulasta and in one week for labs and evaluation of chemotoxicities.  She will see Dr. Jana Hakim on 04/08/14.   She knows to call us in the interim for any questions or concerns.  We can certainly see her sooner if needed.  I spent 25 minutes counseling the patient face to face.  The total time spent in the appointment was 30 minutes.     Minette Headland, [place] [phone] 03/27/2014 3:26 PM

## 2014-03-25 NOTE — Progress Notes (Signed)
1312-Adriamycin push started at 1309 to right hand.  Positive blood return noted.  At attempt to push Adriamycin, pt stated that IV site feels "uncomfortable".  Blood return remains to IV site.  Adriamycin not given.  IV site unremarkable with positive blood return.  Ice applied to pt.'s right forearm above IV site.  Pt scheduled for dye study tomorrow at 10:30AM.  Will hold chemo until after dye study tomorrow.  Guy Begin NP notified.

## 2014-03-25 NOTE — Patient Instructions (Signed)
Iron-Rich Diet An iron-rich diet contains foods that are good sources of iron. Iron is an important mineral that helps your body produce hemoglobin. Hemoglobin is a protein in red blood cells that carries oxygen to the body's tissues. Sometimes, the iron level in your blood can be low. This may be caused by:  A lack of iron in your diet.  Blood loss.  Times of growth, such as during pregnancy or during a child's growth and development. Low levels of iron can cause a decrease in the number of red blood cells. This can result in iron deficiency anemia. Iron deficiency anemia symptoms include:  Tiredness.  Weakness.  Irritability.  Increased chance of infection. Here are some recommendations for daily iron intake:  Males older than 42 years of age need 8 mg of iron per day.  Women ages 19 to 50 need 18 mg of iron per day.  Pregnant women need 27 mg of iron per day, and women who are over 19 years of age and breastfeeding need 9 mg of iron per day.  Women over the age of 50 need 8 mg of iron per day. SOURCES OF IRON There are 2 types of iron that are found in food: heme iron and nonheme iron. Heme iron is absorbed by the body better than nonheme iron. Heme iron is found in meat, poultry, and fish. Nonheme iron is found in grains, beans, and vegetables. Heme Iron Sources Food / Iron (mg)  Chicken liver, 3 oz (85 g)/ 10 mg  Beef liver, 3 oz (85 g)/ 5.5 mg  Oysters, 3 oz (85 g)/ 8 mg  Beef, 3 oz (85 g)/ 2 to 3 mg  Shrimp, 3 oz (85 g)/ 2.8 mg  Turkey, 3 oz (85 g)/ 2 mg  Chicken, 3 oz (85 g) / 1 mg  Fish (tuna, halibut), 3 oz (85 g)/ 1 mg  Pork, 3 oz (85 g)/ 0.9 mg Nonheme Iron Sources Food / Iron (mg)  Ready-to-eat breakfast cereal, iron-fortified / 3.9 to 7 mg  Tofu,  cup / 3.4 mg  Kidney beans,  cup / 2.6 mg  Baked potato with skin / 2.7 mg  Asparagus,  cup / 2.2 mg  Avocado / 2 mg  Dried peaches,  cup / 1.6 mg  Raisins,  cup / 1.5 mg  Soy milk, 1 cup  / 1.5 mg  Whole-wheat bread, 1 slice / 1.2 mg  Spinach, 1 cup / 0.8 mg  Broccoli,  cup / 0.6 mg IRON ABSORPTION Certain foods can decrease the body's absorption of iron. Try to avoid these foods and beverages while eating meals with iron-containing foods:  Coffee.  Tea.  Fiber.  Soy. Foods containing vitamin C can help increase the amount of iron your body absorbs from iron sources, especially from nonheme sources. Eat foods with vitamin C along with iron-containing foods to increase your iron absorption. Foods that are high in vitamin C include many fruits and vegetables. Some good sources are:  Fresh orange juice.  Oranges.  Strawberries.  Mangoes.  Grapefruit.  Red bell peppers.  Green bell peppers.  Broccoli.  Potatoes with skin.  Tomato juice. Document Released: 04/26/2005 Document Revised: 12/05/2011 Document Reviewed: 03/03/2011 ExitCare Patient Information 2015 [employer]. This information is not intended to replace advice given to you by your health care provider. Make sure you discuss any questions you have with your health care provider.  

## 2014-03-26 ENCOUNTER — Ambulatory Visit (HOSPITAL_COMMUNITY)
Admission: RE | Admit: 2014-03-26 | Discharge: 2014-03-26 | Disposition: A | Discharge status: Home / Self Care | Payer: Medicaid Other | Source: Ambulatory Visit | Attending: Adult Health | Admitting: Adult Health

## 2014-03-26 ENCOUNTER — Encounter (HOSPITAL_COMMUNITY): Payer: Medicaid Other

## 2014-03-26 ENCOUNTER — Telehealth: Payer: Self-pay | Admitting: Adult Health

## 2014-03-26 ENCOUNTER — Telehealth: Payer: Self-pay | Admitting: *Deleted

## 2014-03-26 ENCOUNTER — Ambulatory Visit: Payer: Medicaid Other

## 2014-03-26 ENCOUNTER — Other Ambulatory Visit: Payer: Self-pay | Admitting: Adult Health

## 2014-03-26 ENCOUNTER — Ambulatory Visit (HOSPITAL_BASED_OUTPATIENT_CLINIC_OR_DEPARTMENT_OTHER): Payer: Medicaid Other

## 2014-03-26 VITALS — BP 131/79 | HR 57 | Temp 97.6°F | Resp 18

## 2014-03-26 DIAGNOSIS — Z5111 Encounter for antineoplastic chemotherapy: Secondary | ICD-10-CM

## 2014-03-26 DIAGNOSIS — C50411 Malignant neoplasm of upper-outer quadrant of right female breast: Secondary | ICD-10-CM

## 2014-03-26 DIAGNOSIS — I8290 Acute embolism and thrombosis of unspecified vein: Secondary | ICD-10-CM

## 2014-03-26 DIAGNOSIS — Z452 Encounter for adjustment and management of vascular access device: Secondary | ICD-10-CM | POA: Insufficient documentation

## 2014-03-26 DIAGNOSIS — C50919 Malignant neoplasm of unspecified site of unspecified female breast: Secondary | ICD-10-CM | POA: Diagnosis not present

## 2014-03-26 DIAGNOSIS — C50419 Malignant neoplasm of upper-outer quadrant of unspecified female breast: Secondary | ICD-10-CM

## 2014-03-26 DIAGNOSIS — D509 Iron deficiency anemia, unspecified: Secondary | ICD-10-CM

## 2014-03-26 MED ORDER — HEPARIN SOD (PORK) LOCK FLUSH 100 UNIT/ML IV SOLN
INTRAVENOUS | Status: AC
  Filled 2014-03-26: qty 5

## 2014-03-26 MED ORDER — IOHEXOL 300 MG/ML  SOLN: 50.0000 mL | Freq: Once | INTRAMUSCULAR | Status: AC | PRN

## 2014-03-26 MED ORDER — DEXAMETHASONE SODIUM PHOSPHATE 10 MG/ML IJ SOLN
10.0000 mg | Freq: Once | INTRAMUSCULAR | Status: AC
  Administered 2014-03-26: 10 mg via INTRAVENOUS

## 2014-03-26 MED ORDER — PROCHLORPERAZINE MALEATE 10 MG PO TABS
ORAL_TABLET | ORAL | Status: AC
  Filled 2014-03-26: qty 1

## 2014-03-26 MED ORDER — PROCHLORPERAZINE MALEATE 10 MG PO TABS
10.0000 mg | ORAL_TABLET | Freq: Once | ORAL | Status: AC
  Administered 2014-03-26: 10 mg via ORAL

## 2014-03-26 MED ORDER — CYCLOPHOSPHAMIDE CHEMO INJECTION 1 GM
600.0000 mg/m2 | Freq: Once | INTRAMUSCULAR | Status: AC
  Administered 2014-03-26: 1200 mg via INTRAVENOUS
  Filled 2014-03-26: qty 60

## 2014-03-26 MED ORDER — DEXAMETHASONE SODIUM PHOSPHATE 10 MG/ML IJ SOLN
INTRAMUSCULAR | Status: AC
  Filled 2014-03-26: qty 1

## 2014-03-26 MED ORDER — SODIUM CHLORIDE 0.9 % IV SOLN
Freq: Once | INTRAVENOUS | Status: AC
  Administered 2014-03-26: 13:00:00 via INTRAVENOUS

## 2014-03-26 MED ORDER — DOXORUBICIN HCL CHEMO IV INJECTION 2 MG/ML
60.0000 mg/m2 | Freq: Once | INTRAVENOUS | Status: AC
  Administered 2014-03-26: 120 mg via INTRAVENOUS
  Filled 2014-03-26: qty 60

## 2014-03-26 NOTE — Telephone Encounter (Signed)
Talked to pt today to inform her that scheduling will be calling her with date and time to have a doppler study done  of left arm. Pt verbalized understanding. Message to be forwarded to Charlestine Massed, NP.

## 2014-03-26 NOTE — Telephone Encounter (Signed)
per pof LC stat orders for a Venous Doppler-cld sch pt @ 10am @[facility] -pt inderstood-cld & spoke w/pt to give time & location

## 2014-03-26 NOTE — Patient Instructions (Signed)
[facility] Discharge Instructions for Patients Receiving Chemotherapy  Today you received the following chemotherapy agents Adriamycin/Cyclophasphamide.   To help prevent nausea and vomiting after your treatment, we encourage you to take your nausea medication as directed.    If you develop nausea and vomiting that is not controlled by your nausea medication, call the clinic.   BELOW ARE SYMPTOMS THAT SHOULD BE REPORTED IMMEDIATELY:  *FEVER GREATER THAN 100.5 F  *CHILLS WITH OR WITHOUT FEVER  NAUSEA AND VOMITING THAT IS NOT CONTROLLED WITH YOUR NAUSEA MEDICATION  *UNUSUAL SHORTNESS OF BREATH  *UNUSUAL BRUISING OR BLEEDING  TENDERNESS IN MOUTH AND THROAT WITH OR WITHOUT PRESENCE OF ULCERS  *URINARY PROBLEMS  *BOWEL PROBLEMS  UNUSUAL RASH Items with * indicate a potential emergency and should be followed up as soon as possible.  Feel free to call the clinic you have any questions or concerns. The clinic phone number is [phone].

## 2014-03-26 NOTE — Progress Notes (Signed)
Brisk blood return noted before, during, and after adriamycin.

## 2014-03-26 NOTE — Progress Notes (Signed)
[place] Work  Clinical Social Work was referred by APP for information on childcare and assessment of psychosocial needs.  Clinical Social Worker met with patient at in the infusion room at [facility] to offer support and assess for needs.  Patient is currently living here and carrying for her 42 year old daughter, while her husband and 2 older children are in [place].  Patient reported having some support through church and family, but expressed the need for additional childcare support the weeks after she has chemotherapy.  CSW and patient discussed childcare options.  Patient stated she had researched childcare through her Medicaid benefits, but there is very long waiting list and immediate support is unavailable.  While there are very limited childcare resources CSW will research other possible childcare options and follow up with pt at her next appointment.    Johnnye Lana, MSW, [place] Worker [facility] [phone]

## 2014-03-27 ENCOUNTER — Encounter (HOSPITAL_BASED_OUTPATIENT_CLINIC_OR_DEPARTMENT_OTHER): Payer: Self-pay | Admitting: *Deleted

## 2014-03-27 ENCOUNTER — Telehealth: Payer: Self-pay | Admitting: *Deleted

## 2014-03-27 ENCOUNTER — Ambulatory Visit (HOSPITAL_COMMUNITY)
Admission: RE | Admit: 2014-03-27 | Discharge: 2014-03-27 | Disposition: A | Discharge status: Home / Self Care | Payer: Medicaid Other | Source: Ambulatory Visit | Attending: Oncology | Admitting: Oncology

## 2014-03-27 ENCOUNTER — Ambulatory Visit: Payer: Medicaid Other

## 2014-03-27 ENCOUNTER — Ambulatory Visit (HOSPITAL_BASED_OUTPATIENT_CLINIC_OR_DEPARTMENT_OTHER): Payer: Medicaid Other

## 2014-03-27 VITALS — BP 144/93 | HR 53 | Temp 97.6°F

## 2014-03-27 DIAGNOSIS — C50411 Malignant neoplasm of upper-outer quadrant of right female breast: Secondary | ICD-10-CM

## 2014-03-27 DIAGNOSIS — I8290 Acute embolism and thrombosis of unspecified vein: Secondary | ICD-10-CM

## 2014-03-27 DIAGNOSIS — R609 Edema, unspecified: Secondary | ICD-10-CM | POA: Insufficient documentation

## 2014-03-27 DIAGNOSIS — M7989 Other specified soft tissue disorders: Secondary | ICD-10-CM

## 2014-03-27 DIAGNOSIS — Z5189 Encounter for other specified aftercare: Secondary | ICD-10-CM

## 2014-03-27 DIAGNOSIS — M79609 Pain in unspecified limb: Secondary | ICD-10-CM | POA: Insufficient documentation

## 2014-03-27 DIAGNOSIS — C50419 Malignant neoplasm of upper-outer quadrant of unspecified female breast: Secondary | ICD-10-CM

## 2014-03-27 DIAGNOSIS — C50919 Malignant neoplasm of unspecified site of unspecified female breast: Secondary | ICD-10-CM

## 2014-03-27 MED ORDER — PEGFILGRASTIM INJECTION 6 MG/0.6ML
6.0000 mg | Freq: Once | SUBCUTANEOUS | Status: AC
  Administered 2014-03-27: 6 mg via SUBCUTANEOUS
  Filled 2014-03-27: qty 0.6

## 2014-03-27 NOTE — Progress Notes (Signed)
VASCULAR LAB PRELIMINARY  PRELIMINARY  PRELIMINARY  PRELIMINARY  Left upper extremity venous duplex completed.    Preliminary report:  No evidence of left upper extremity deep vein or superficial vein thrombosis  Harold Mattes, RVS 03/27/2014, 11:08 AM

## 2014-03-27 NOTE — Telephone Encounter (Signed)
This RN was contacted by treatment room RN wanting to follow up on plan of action per [facility] study showing " need for revision".  This reviewed chart and did not see any documentation per dye study obtained yesterday. Pt received chemo by peripheal IV 03/26/2014.  This RN called to [employer] and discussed above situation and need for follow up with Dr Excell Seltzer. Was informed by CCS - LC from this office has contacted Dr Excell Seltzer and he will either revise or send pt to IR for appropriate follow up.

## 2014-03-27 NOTE — Telephone Encounter (Signed)
Received call from [place] in vascular lab re:  Pt had doppler study on Left upper extremity today -  Result  Negative for DVT.   Lisabeth Register, NP notified.  OK to discharge pt;  Pt will be followed up by NP and/or desk nurse for further instructions.

## 2014-03-31 ENCOUNTER — Ambulatory Visit (HOSPITAL_BASED_OUTPATIENT_CLINIC_OR_DEPARTMENT_OTHER): Payer: Medicaid Other | Admitting: Anesthesiology

## 2014-03-31 ENCOUNTER — Other Ambulatory Visit (HOSPITAL_COMMUNITY): Payer: Self-pay | Admitting: Cardiology

## 2014-03-31 ENCOUNTER — Encounter (HOSPITAL_BASED_OUTPATIENT_CLINIC_OR_DEPARTMENT_OTHER)
Admission: RE | Disposition: A | Discharge status: Home / Self Care | Payer: Self-pay | Source: Ambulatory Visit | Attending: General Surgery

## 2014-03-31 ENCOUNTER — Encounter (HOSPITAL_BASED_OUTPATIENT_CLINIC_OR_DEPARTMENT_OTHER): Payer: Self-pay | Admitting: *Deleted

## 2014-03-31 ENCOUNTER — Encounter (HOSPITAL_BASED_OUTPATIENT_CLINIC_OR_DEPARTMENT_OTHER): Payer: Medicaid Other | Admitting: Anesthesiology

## 2014-03-31 ENCOUNTER — Telehealth: Payer: Self-pay | Admitting: *Deleted

## 2014-03-31 ENCOUNTER — Ambulatory Visit (HOSPITAL_BASED_OUTPATIENT_CLINIC_OR_DEPARTMENT_OTHER)
Admission: RE | Admit: 2014-03-31 | Discharge: 2014-03-31 | Disposition: A | Discharge status: Home / Self Care | Payer: Medicaid Other | Source: Ambulatory Visit | Attending: General Surgery | Admitting: General Surgery

## 2014-03-31 DIAGNOSIS — T82598A Other mechanical complication of other cardiac and vascular devices and implants, initial encounter: Secondary | ICD-10-CM | POA: Diagnosis present

## 2014-03-31 DIAGNOSIS — F411 Generalized anxiety disorder: Secondary | ICD-10-CM | POA: Insufficient documentation

## 2014-03-31 DIAGNOSIS — D649 Anemia, unspecified: Secondary | ICD-10-CM | POA: Diagnosis not present

## 2014-03-31 DIAGNOSIS — Y849 Medical procedure, unspecified as the cause of abnormal reaction of the patient, or of later complication, without mention of misadventure at the time of the procedure: Secondary | ICD-10-CM | POA: Diagnosis not present

## 2014-03-31 DIAGNOSIS — Z79899 Other long term (current) drug therapy: Secondary | ICD-10-CM | POA: Insufficient documentation

## 2014-03-31 DIAGNOSIS — I10 Essential (primary) hypertension: Secondary | ICD-10-CM | POA: Insufficient documentation

## 2014-03-31 DIAGNOSIS — K219 Gastro-esophageal reflux disease without esophagitis: Secondary | ICD-10-CM | POA: Diagnosis not present

## 2014-03-31 DIAGNOSIS — C50919 Malignant neoplasm of unspecified site of unspecified female breast: Secondary | ICD-10-CM | POA: Diagnosis not present

## 2014-03-31 DIAGNOSIS — T829XXD Unspecified complication of cardiac and vascular prosthetic device, implant and graft, subsequent encounter: Secondary | ICD-10-CM

## 2014-03-31 DIAGNOSIS — Z452 Encounter for adjustment and management of vascular access device: Secondary | ICD-10-CM

## 2014-03-31 HISTORY — PX: PORT-A-CATH REMOVAL: SHX5289

## 2014-03-31 HISTORY — DX: Anxiety disorder, unspecified: F41.9

## 2014-03-31 SURGERY — REMOVAL PORT-A-CATH
Anesthesia: Monitor Anesthesia Care | Site: Chest

## 2014-03-31 MED ORDER — ONDANSETRON HCL 4 MG/2ML IJ SOLN
INTRAMUSCULAR | Status: DC | PRN
  Administered 2014-03-31: 4 mg via INTRAVENOUS

## 2014-03-31 MED ORDER — FENTANYL CITRATE 0.05 MG/ML IJ SOLN
25.0000 ug | INTRAMUSCULAR | Status: DC | PRN
  Administered 2014-03-31: 50 ug via INTRAVENOUS

## 2014-03-31 MED ORDER — PROPOFOL 10 MG/ML IV BOLUS
INTRAVENOUS | Status: DC | PRN
  Administered 2014-03-31: 20 mg via INTRAVENOUS

## 2014-03-31 MED ORDER — PROPOFOL 10 MG/ML IV EMUL
INTRAVENOUS | Status: DC | PRN
  Administered 2014-03-31: 100 ug/kg/min via INTRAVENOUS

## 2014-03-31 MED ORDER — LIDOCAINE HCL (PF) 1 % IJ SOLN
INTRAMUSCULAR | Status: AC
  Filled 2014-03-31: qty 30

## 2014-03-31 MED ORDER — PROPOFOL 10 MG/ML IV BOLUS
INTRAVENOUS | Status: AC
  Filled 2014-03-31: qty 20

## 2014-03-31 MED ORDER — HEPARIN (PORCINE) IN NACL 2-0.9 UNIT/ML-% IJ SOLN
INTRAMUSCULAR | Status: AC
  Filled 2014-03-31: qty 500

## 2014-03-31 MED ORDER — OXYCODONE HCL 5 MG PO TABS
5.0000 mg | ORAL_TABLET | Freq: Once | ORAL | Status: AC | PRN
  Administered 2014-03-31: 5 mg via ORAL

## 2014-03-31 MED ORDER — CEFAZOLIN SODIUM-DEXTROSE 2-3 GM-% IV SOLR
INTRAVENOUS | Status: AC
  Filled 2014-03-31: qty 50

## 2014-03-31 MED ORDER — FENTANYL CITRATE 0.05 MG/ML IJ SOLN
INTRAMUSCULAR | Status: AC
  Filled 2014-03-31: qty 4

## 2014-03-31 MED ORDER — FENTANYL CITRATE 0.05 MG/ML IJ SOLN
INTRAMUSCULAR | Status: AC
  Filled 2014-03-31: qty 2

## 2014-03-31 MED ORDER — MIDAZOLAM HCL 5 MG/5ML IJ SOLN
INTRAMUSCULAR | Status: DC | PRN
  Administered 2014-03-31: 2 mg via INTRAVENOUS

## 2014-03-31 MED ORDER — LACTATED RINGERS IV SOLN
INTRAVENOUS | Status: DC
  Administered 2014-03-31: 14:00:00 via INTRAVENOUS
  Administered 2014-03-31: 10 mL/h via INTRAVENOUS

## 2014-03-31 MED ORDER — SODIUM BICARBONATE 4 % IV SOLN
INTRAVENOUS | Status: AC
  Filled 2014-03-31: qty 5

## 2014-03-31 MED ORDER — BUPIVACAINE-EPINEPHRINE (PF) 0.25% -1:200000 IJ SOLN
INTRAMUSCULAR | Status: AC
  Filled 2014-03-31: qty 30

## 2014-03-31 MED ORDER — HEPARIN SOD (PORK) LOCK FLUSH 100 UNIT/ML IV SOLN
INTRAVENOUS | Status: AC
  Filled 2014-03-31: qty 5

## 2014-03-31 MED ORDER — FENTANYL CITRATE 0.05 MG/ML IJ SOLN
INTRAMUSCULAR | Status: DC | PRN
  Administered 2014-03-31: 100 ug via INTRAVENOUS

## 2014-03-31 MED ORDER — OXYCODONE HCL 5 MG/5ML PO SOLN: 5.0000 mg | Freq: Once | ORAL | Status: AC | PRN

## 2014-03-31 MED ORDER — CEFAZOLIN SODIUM-DEXTROSE 2-3 GM-% IV SOLR
INTRAVENOUS | Status: DC | PRN
  Administered 2014-03-31: 2 g via INTRAVENOUS

## 2014-03-31 MED ORDER — FENTANYL CITRATE 0.05 MG/ML IJ SOLN: 50.0000 ug | INTRAMUSCULAR | Status: DC | PRN

## 2014-03-31 MED ORDER — MIDAZOLAM HCL 2 MG/2ML IJ SOLN
INTRAMUSCULAR | Status: AC
  Filled 2014-03-31: qty 2

## 2014-03-31 MED ORDER — MIDAZOLAM HCL 2 MG/2ML IJ SOLN: 1.0000 mg | INTRAMUSCULAR | Status: DC | PRN

## 2014-03-31 MED ORDER — ONDANSETRON HCL 4 MG/2ML IJ SOLN: 4.0000 mg | Freq: Four times a day (QID) | INTRAMUSCULAR | Status: DC | PRN

## 2014-03-31 MED ORDER — OXYCODONE HCL 5 MG PO TABS
ORAL_TABLET | ORAL | Status: AC
  Filled 2014-03-31: qty 1

## 2014-03-31 MED ORDER — SODIUM BICARBONATE 4 % IV SOLN
INTRAVENOUS | Status: DC | PRN
  Administered 2014-03-31: 15:00:00

## 2014-03-31 SURGICAL SUPPLY — 44 items
BAG DECANTER FOR FLEXI CONT (MISCELLANEOUS) IMPLANT
BANDAGE ADH SHEER 1  50/CT (GAUZE/BANDAGES/DRESSINGS) IMPLANT
BLADE SURG 15 STRL LF DISP TIS (BLADE) ×2 IMPLANT
BLADE SURG 15 STRL SS (BLADE) ×2
CHLORAPREP W/TINT 26ML (MISCELLANEOUS) ×4 IMPLANT
COVER MAYO STAND STRL (DRAPES) ×4 IMPLANT
COVER TABLE BACK 60X90 (DRAPES) ×4 IMPLANT
DECANTER SPIKE VIAL GLASS SM (MISCELLANEOUS) IMPLANT
DERMABOND ADVANCED (GAUZE/BANDAGES/DRESSINGS) ×2
DERMABOND ADVANCED .7 DNX12 (GAUZE/BANDAGES/DRESSINGS) ×2 IMPLANT
DRAPE C-ARM 42X72 X-RAY (DRAPES) IMPLANT
DRAPE LAPAROTOMY T 102X78X121 (DRAPES) ×4 IMPLANT
DRAPE UTILITY XL STRL (DRAPES) ×4 IMPLANT
ELECT REM PT RETURN 9FT ADLT (ELECTROSURGICAL) ×4
ELECTRODE REM PT RTRN 9FT ADLT (ELECTROSURGICAL) ×2 IMPLANT
GLOVE BIOGEL M STRL SZ7.5 (GLOVE) ×4 IMPLANT
GLOVE BIOGEL PI IND STRL 8 (GLOVE) ×4 IMPLANT
GLOVE BIOGEL PI INDICATOR 8 (GLOVE) ×4
GLOVE EXAM NITRILE MD LF STRL (GLOVE) ×4 IMPLANT
GLOVE SS BIOGEL STRL SZ 7.5 (GLOVE) ×2 IMPLANT
GLOVE SUPERSENSE BIOGEL SZ 7.5 (GLOVE) ×2
GOWN STRL REUS W/ TWL LRG LVL3 (GOWN DISPOSABLE) ×2 IMPLANT
GOWN STRL REUS W/ TWL XL LVL3 (GOWN DISPOSABLE) ×4 IMPLANT
GOWN STRL REUS W/TWL LRG LVL3 (GOWN DISPOSABLE) ×2
GOWN STRL REUS W/TWL XL LVL3 (GOWN DISPOSABLE) ×4
IV CATH PLACEMENT UNIT 16 GA (IV SOLUTION) IMPLANT
IV HEPARIN 1000UNITS/500ML (IV SOLUTION) IMPLANT
IV KIT MINILOC 20X1 SAFETY (NEEDLE) IMPLANT
KIT BARDPORT ISP (Port) IMPLANT
KIT POWER CATH 8FR (Catheter) IMPLANT
NEEDLE HYPO 22GX1.5 SAFETY (NEEDLE) ×4 IMPLANT
NEEDLE HYPO 25X1 1.5 SAFETY (NEEDLE) ×4 IMPLANT
PACK BASIN DAY SURGERY FS (CUSTOM PROCEDURE TRAY) ×4 IMPLANT
PENCIL BUTTON HOLSTER BLD 10FT (ELECTRODE) ×4 IMPLANT
SET SHEATH INTRODUCER 10FR (MISCELLANEOUS) IMPLANT
SHEATH COOK PEEL AWAY SET 9F (SHEATH) IMPLANT
SLEEVE SCD COMPRESS KNEE MED (MISCELLANEOUS) IMPLANT
SUT MON AB 4-0 PC3 18 (SUTURE) ×4 IMPLANT
SUT PROLENE 2 0 CT2 30 (SUTURE) IMPLANT
SUT SILK 4 0 TIES 17X18 (SUTURE) IMPLANT
SYR 5ML LUER SLIP (SYRINGE) IMPLANT
SYRINGE CONTROL L 12CC (SYRINGE) ×4 IMPLANT
TOWEL OR 17X24 6PK STRL BLUE (TOWEL DISPOSABLE) ×4 IMPLANT
TOWEL OR NON WOVEN STRL DISP B (DISPOSABLE) IMPLANT

## 2014-03-31 NOTE — Discharge Instructions (Signed)
Call [phone] as needed for increasing pain, redness, drainage or other concerns. May shower tomorrow. Call Dr. Excell Seltzer for an appointment as soon as you return     Post Anesthesia Home Care Instructions  Activity: Get plenty of rest for the remainder of the day. A responsible adult should stay with you for 24 hours following the procedure.  For the next 24 hours, DO NOT: -Drive a car -Paediatric nurse -Drink alcoholic beverages -Take any medication unless instructed by your physician -Make any legal decisions or sign important papers.  Meals: Start with liquid foods such as gelatin or soup. Progress to regular foods as tolerated. Avoid greasy, spicy, heavy foods. If nausea and/or vomiting occur, drink only clear liquids until the nausea and/or vomiting subsides. Call your physician if vomiting continues.  Special Instructions/Symptoms: Your throat may feel dry or sore from the anesthesia or the breathing tube placed in your throat during surgery. If this causes discomfort, gargle with warm salt water. The discomfort should disappear within 24 hours.

## 2014-03-31 NOTE — Anesthesia Postprocedure Evaluation (Signed)
Anesthesia Post Note  Patient: Rhonda [name]  Procedure(s) Performed: Procedure(s) (LRB): REMOVAL PORT-A-CATH (Left)  Anesthesia type: MAC  Patient location: PACU  Post pain: Pain level controlled and Adequate analgesia  Post assessment: Post-op Vital signs reviewed, Patient's Cardiovascular Status Stable and Respiratory Function Stable  Last Vitals:  Filed Vitals:   03/31/14 1545  BP: 128/71  Pulse: 51  Temp:   Resp: 14    Post vital signs: Reviewed and stable  Level of consciousness: awake, alert  and oriented  Complications: No apparent anesthesia complications

## 2014-03-31 NOTE — Transfer of Care (Signed)
Immediate Anesthesia Transfer of Care Note  Patient: Rhonda [name]  Procedure(s) Performed: Procedure(s): REMOVAL PORT-A-CATH (Left)  Patient Location: PACU  Anesthesia Type:MAC  Level of Consciousness: awake, alert  and oriented  Airway & Oxygen Therapy: Patient Spontanous Breathing and Patient connected to face mask oxygen  Post-op Assessment: Report given to PACU RN and Post -op Vital signs reviewed and stable  Post vital signs: Reviewed and stable  Complications: No apparent anesthesia complications

## 2014-03-31 NOTE — Anesthesia Preprocedure Evaluation (Signed)
Anesthesia Evaluation  Patient identified by MRN, date of birth, ID band Patient awake    Reviewed: Allergy & Precautions, H&P , NPO status , Patient's Chart, lab work & pertinent test results  Airway Mallampati: II  Neck ROM: full    Dental   Pulmonary          Cardiovascular hypertension,     Neuro/Psych  Headaches, Anxiety    GI/Hepatic GERD-  ,  Endo/Other    Renal/GU      Musculoskeletal   Abdominal   Peds  Hematology  (+) anemia ,   Anesthesia Other Findings   Reproductive/Obstetrics Breast CA                           Anesthesia Physical Anesthesia Plan  ASA: II  Anesthesia Plan: General   Post-op Pain Management:    Induction: Intravenous  Airway Management Planned: LMA  Additional Equipment:   Intra-op Plan:   Post-operative Plan:   Informed Consent: I have reviewed the patients History and Physical, chart, labs and discussed the procedure including the risks, benefits and alternatives for the proposed anesthesia with the patient or authorized representative who has indicated his/her understanding and acceptance.     Plan Discussed with: CRNA, Anesthesiologist and Surgeon  Anesthesia Plan Comments:         Anesthesia Quick Evaluation

## 2014-03-31 NOTE — Op Note (Signed)
Pre op diagnosis: status post Port-A-Cath with displacement and clot  Postop diagnosis: Same  Surgical procedure: Removal of Port-A-Cath  Surgeon: Excell Seltzer  Anesthesia: Local with IV sedation  Description of procedure: The patient is positioned supine and the anterior chest sterilely prepped and draped.Patient and procedure were verified. Local anesthesia was used to infiltrate the skin and underlying soft tissue. The previous incision was opened and sharp dissection carried down onto the port and catheter. The catheter was withdrawn intact. The port was sharply dissected out of the subcutaneous tissue and removed with all suture material. The subcutaneous tissue was closed with interrupted 4-0 Monocryl and the skin was closed with subcuticular 4-0 Monocryl and Dermabond. Patient tolerated the procedure well.   Edward Jolly MD, FACS  03/31/2014

## 2014-03-31 NOTE — Telephone Encounter (Signed)
Received call from patient stating she is supposed to have her port revised/replaced today with Dr. Excell Seltzer.  She states she would like it removed and put back in when she returns from [place].  She is complaining that is uncomfortable and has been like that ever since she had it put in.  Encouraged her to discuss this with Dr. Excell Seltzer as he may be able to revise the port or have a new one put and it may be more comfortable for her. I think though her mind is set on having it removed.  She states she will be gone probably a month to [place].  She is aware of her appointment tomorrow 7/7 with Mendel Ryder.

## 2014-03-31 NOTE — H&P (Signed)
  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 She last week developed pain in her shoulder and upper arm after chemotherapy injection. Possibly some subjective swelling. Ultrasound of the subclavian and axillary veins were negative. I are injection of the Port-A-Cath showed that it had flipped up into the right innominate vein and there was some significant clot noted around the catheter. She continued to have discomfort. With these findings I have recommended removal of her current Port-A-Cath. She actually is going to [place] for 3-4 weeks with a planned a short break in her chemotherapy so that she can get her children back here. We therefore are not going to replace her Port-A-Cath immediately but plan to replace it when she returns for completion of her chemotherapy. At that time I would recommend a right internal jugular approach.  Past Medical History  Diagnosis Date  . Hypertension   . Blood transfusion without reported diagnosis     3 previous   . Anemia   . Ulcer     hx of stomach ulcer a long time ago  . GERD (gastroesophageal reflux disease)     a long time ago  . Headache(784.0)   . Cancer 10/2013    right breast cancer  . Anxiety    Past Surgical History  Procedure Laterality Date  . Cesarean section      2 previous  . Back surgery  2000  . Appendectomy      a long time ago in [place]  . Portacath placement N/A 02/06/2014    Procedure: INSERTION PORT-A-CATH;  Surgeon: Edward Jolly, MD;  Location: [facility];  Service: General;  Laterality: N/A;   Exam: Ht 5\' 10"  (1.778 m)  Wt 190 lb (86.183 kg)  BMI 27.26 kg/m2  LMP 11/02/2013 General: Alert well-developed African female no distress Skin: A right ear infection HEENT: No facial swelling. Lungs: No wheezing or increased work of breathing Extremities: I  cannot take any swelling in the left upper extremity Neurologic: Alert and fully oriented. Affect normal.  Assessment and plan: Thrombosis and displaced Port-A-Cath on the left. After discussion as above we will plan removal of her Port-A-Cath today. She does not want the port replaced today due to her travel to [place] I think this is a very reasonable plan to have it replaced when she returns to the [place]. We discussed the indications for the procedure and the current problem and slight risks of bleeding and infection. She understands and agrees.

## 2014-04-01 ENCOUNTER — Other Ambulatory Visit (HOSPITAL_BASED_OUTPATIENT_CLINIC_OR_DEPARTMENT_OTHER): Payer: Medicaid Other

## 2014-04-01 ENCOUNTER — Telehealth: Payer: Self-pay | Admitting: Adult Health

## 2014-04-01 ENCOUNTER — Encounter: Payer: Self-pay | Admitting: Adult Health

## 2014-04-01 ENCOUNTER — Ambulatory Visit: Payer: Medicaid Other

## 2014-04-01 ENCOUNTER — Encounter (HOSPITAL_BASED_OUTPATIENT_CLINIC_OR_DEPARTMENT_OTHER): Payer: Medicaid Other | Admitting: Oncology

## 2014-04-01 ENCOUNTER — Telehealth: Payer: Self-pay | Admitting: Oncology

## 2014-04-01 ENCOUNTER — Ambulatory Visit (HOSPITAL_BASED_OUTPATIENT_CLINIC_OR_DEPARTMENT_OTHER): Payer: Medicaid Other | Admitting: Adult Health

## 2014-04-01 VITALS — BP 113/76 | HR 79 | Temp 98.2°F | Resp 18 | Ht 70.0 in | Wt 191.9 lb

## 2014-04-01 DIAGNOSIS — C773 Secondary and unspecified malignant neoplasm of axilla and upper limb lymph nodes: Secondary | ICD-10-CM

## 2014-04-01 DIAGNOSIS — C50419 Malignant neoplasm of upper-outer quadrant of unspecified female breast: Secondary | ICD-10-CM

## 2014-04-01 DIAGNOSIS — C50411 Malignant neoplasm of upper-outer quadrant of right female breast: Secondary | ICD-10-CM

## 2014-04-01 DIAGNOSIS — Z171 Estrogen receptor negative status [ER-]: Secondary | ICD-10-CM

## 2014-04-01 LAB — CBC WITH DIFFERENTIAL/PLATELET
BASO%: 0.7 % (ref 0.0–2.0)
Basophils Absolute: 0 10*3/uL (ref 0.0–0.1)
EOS%: 0.6 % (ref 0.0–7.0)
Eosinophils Absolute: 0 10*3/uL (ref 0.0–0.5)
HEMATOCRIT: 31.6 % — AB (ref 34.8–46.6)
HEMOGLOBIN: 9.9 g/dL — AB (ref 11.6–15.9)
LYMPH#: 0.5 10*3/uL — AB (ref 0.9–3.3)
LYMPH%: 31.9 % (ref 14.0–49.7)
MCH: 22.1 pg — ABNORMAL LOW (ref 25.1–34.0)
MCHC: 31.3 g/dL — AB (ref 31.5–36.0)
MCV: 70.5 fL — ABNORMAL LOW (ref 79.5–101.0)
MONO#: 0.1 10*3/uL (ref 0.1–0.9)
MONO%: 8.4 % (ref 0.0–14.0)
NEUT#: 0.8 10*3/uL — ABNORMAL LOW (ref 1.5–6.5)
NEUT%: 58.4 % (ref 38.4–76.8)
Platelets: 194 10*3/uL (ref 145–400)
RBC: 4.49 10*6/uL (ref 3.70–5.45)
RDW: 17.2 % — AB (ref 11.2–14.5)
WBC: 1.5 10*3/uL — AB (ref 3.9–10.3)

## 2014-04-01 LAB — COMPREHENSIVE METABOLIC PANEL (CC13)
ALBUMIN: 3.2 g/dL — AB (ref 3.5–5.0)
ALT: 14 U/L (ref 0–55)
ANION GAP: 7 meq/L (ref 3–11)
AST: 14 U/L (ref 5–34)
Alkaline Phosphatase: 87 U/L (ref 40–150)
BUN: 13.7 mg/dL (ref 7.0–26.0)
CALCIUM: 9.5 mg/dL (ref 8.4–10.4)
CHLORIDE: 104 meq/L (ref 98–109)
CO2: 28 meq/L (ref 22–29)
Creatinine: 0.7 mg/dL (ref 0.6–1.1)
Glucose: 95 mg/dl (ref 70–140)
Potassium: 4 mEq/L (ref 3.5–5.1)
Sodium: 139 mEq/L (ref 136–145)
TOTAL PROTEIN: 6.9 g/dL (ref 6.4–8.3)
Total Bilirubin: 0.99 mg/dL (ref 0.20–1.20)

## 2014-04-01 MED ORDER — ENOXAPARIN SODIUM 40 MG/0.4ML ~~LOC~~ SOLN
40.0000 mg | Freq: Once | SUBCUTANEOUS | Status: AC
  Administered 2014-04-01: 40 mg via SUBCUTANEOUS
  Filled 2014-04-01: qty 0.4

## 2014-04-01 MED ORDER — CIPROFLOXACIN HCL 500 MG PO TABS: 500.0000 mg | ORAL_TABLET | Freq: Two times a day (BID) | ORAL | Status: DC

## 2014-04-01 NOTE — Addendum Note (Signed)
Addendum created 04/01/14 1002 by Tawni Millers, CRNA   Modules edited: Charges VN

## 2014-04-01 NOTE — Telephone Encounter (Signed)
, °

## 2014-04-01 NOTE — Progress Notes (Signed)
[name]  [phone] Fax:(336) [phone]     ID: [name] OB: [birth year]  MR#: [id number]  QIW#:[id number]  PCP: Tereasa Coop, PA-C GYN:  Mora Bellman, MD SU: Excell Seltzer, MD OTHER MD: Arloa Koh, MD  CHIEF COMPLAINT: Right breast cancer/neoadjuvant chemotherapy   BREAST CANCER HISTORY: Rhonda [name] was visiting relatives in [place] about 4 months ago when she noted pain in her right breast. In 2003 she had a right breast infection which felt pretty much the same. She was treated with antibiotics back then (while living in [place]). Accordingly, this time she minimized the symptoms. It felt like milk was coming down she says. She also had breast pain while having her period. Then in February 2015, she actually found 2 lumps in her right breast breast which she initially ignored. Eventually as the symptoms seemed to progress she saw a Dr. [place] and he told her she likely had breast cancer. She waited until coming back to the states to seek definitive care.  On 12/17/2013 she had bilateral diagnostic mammography in [place] [facility] In the upper outer right breast there were pleomorphic calcifications measuring approximately 9 cm in diameter. There were 4 distinct microlobulated masses in the right breast, 3 of them in the upper outer quadrant one in the lower outer quadrant. Each measures approximately 1.2 cm. Ultrasound showed a complex cystic mass in the superior central right breast, a 4 mm hypoechoic lesion, 3 suspicious solid masses elsewhere in the right breast, all of them hypoechoic and oval are round with increased vascularity. The largest of these masses measure 1.7 cm. Findings in the left breast were not suspicious or specific. The patient was referred to the breast Center where on 01/20/2014 she underwent biopsy of 2 of the right breast masses, and a right axillary lymph node. 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  On 01/28/2014 the patient underwent bilateral breast MRI. This showed, in the right breast, multiple irregular enhancing masses in the setting of clumped non-masslike enhancement the area in question measured up to 9.4 cm. There was diffuse asymmetric skin thickening and edema involving the right breast, but no obvious nipple or pectoralis involvement. There were several enlarged and morphologically abnormal right axillary lymph node as well as inter-pectoral and subpectoral adenopathy there the largest right axillary lymph node measured 2.7 cm. There was no internal mammary lymphadenopathy noted. The left breast was unremarkable.  Her subsequent history is as detailed below.  INTERVAL HISTORY: Rhonda [name] returns alone today for followup of her locally advanced right breast carcinoma. She is currently receiving neoadjuvant chemotherapy, with today being day 8 cycle 4 of 4 planned dose dense cycles of doxorubicin/cyclophosphamide today. She receives Neulasta on day 2 for granulocyte support.  Last week, on the day of her chemotherapy, the nurses did have difficulty with her left chest port a cath.  She began to experience pain when it was accessed and being flushed.  I ordered a dye study and the port was not in the correct place and had a clot at the end of it.  I ordered a left upper extremity doppler which was normal and there was no occlusive thrombus noted.  She did have her port removed by Dr. Excell Seltzer.  She is planning on going to [place] for one month to arrange for child care for  her children as her mother lives there, until she can complete treatment.  She is scheduled to leave on 04/09/14.    She is otherwise doing well today.  She denies fevers, chills, nausea, vomiting, constipation, diarrhea, numbness, or any further concerns.    REVIEW OF  SYSTEMS: A 10 point review of systems was conducted and is otherwise negative except for what is noted above.    PAST MEDICAL HISTORY: Past Medical History  Diagnosis Date  . Hypertension   . Blood transfusion without reported diagnosis     3 previous   . Anemia   . Ulcer     hx of stomach ulcer a long time ago  . GERD (gastroesophageal reflux disease)     a long time ago  . Headache(784.0)   . Cancer 10/2013    right breast cancer  . Anxiety     PAST SURGICAL HISTORY: Past Surgical History  Procedure Laterality Date  . Cesarean section      2 previous  . Back surgery  2000  . Appendectomy      a long time ago in [place]  . Portacath placement N/A 02/06/2014    Procedure: INSERTION PORT-A-CATH;  Surgeon: Edward Jolly, MD;  Location: [facility];  Service: General;  Laterality: N/A;    FAMILY HISTORY Family History  Problem Relation Age of Onset  . Hypertension Mother   . Hypertension Father    the patient's parents are living but she is not sure of their age. She had 4 brothers and 4 sisters. 2 other brothers died in the [place] and one brother is currently in [place] but they do not know where are even if he is still alive. She has one brother in this area. There is no history of breast or ovarian cancer in the family to her knowledge.  GYNECOLOGIC HISTORY: (Reviewed 03/18/2014) Menarche somewhere between the ages of 34 and 22. First live birth age 19. The patient is GX P3. Her periods are now irregular. She took birth control pills remotely for approximately one year with no complaints.  SOCIAL HISTORY:   (Updated 03/18/2014) Unnamed is a homemaker. Her husband Jacquenette Shone Lol Dalene Carrow is a former Emergency planning/management officer to the [place] from [place] and is a Optometrist. Their children are Duopl (11),Nyamal (8) and Nyewech (4).  The youngest child is currently living with Apolonio Schneiders. The 2 older children are currently in [place]. The patient attends a [employer]  locally    ADVANCED DIRECTIVES: Not in place   HEALTH MAINTENANCE: (Updated 03/18/2014) History  Substance Use Topics  . Smoking status: Never Smoker   . Smokeless tobacco: Never Used  . Alcohol Use: No     Colonoscopy: Never  PAP: April 2015/Dr. Constant  Bone density: Never  Lipid panel: Not on file  No Known Allergies  Current Outpatient Prescriptions  Medication Sig Dispense Refill  . acetaminophen (TYLENOL) 325 MG tablet Take 650 mg by mouth every 6 (six) hours as needed for mild pain or headache.       . Alum & Mag Hydroxide-Simeth (MAGIC MOUTHWASH W/LIDOCAINE) SOLN Take 5 mLs by mouth 4 (four) times daily as needed for mouth pain.  240 mL  1  . baclofen (LIORESAL) 10 MG tablet Take 10 mg by mouth every morning.       . cholecalciferol (VITAMIN D) 1000 UNITS tablet Take 1,000 Units by mouth daily.      Marland Kitchen dexamethasone (DECADRON) 4 MG tablet 2 tabs  by mouth with food twice daily on day before and 3 days after each chemo  60 tablet  1  . HYDROcodone-acetaminophen (NORCO/VICODIN) 5-325 MG per tablet Take 1-2 tablets by mouth every 4 (four) hours as needed (pain.).      Marland Kitchen lidocaine-prilocaine (EMLA) cream Apply to port 1 -2 hr before each procedure/port access as directed  30 g  6  . loratadine (CLARITIN) 10 MG tablet Take 10 mg by mouth daily.      Marland Kitchen LORazepam (ATIVAN) 0.5 MG tablet Take 1 tablet (0.5 mg total) by mouth at bedtime as needed for anxiety or sleep (Nausea or vomiting).  30 tablet  0  . omeprazole (PRILOSEC) 40 MG capsule Take 1 capsule (40 mg total) by mouth daily.  30 capsule  6  . ondansetron (ZOFRAN) 8 MG tablet Take 1 tablet (8 mg total) by mouth 2 (two) times daily as needed for nausea or vomiting. Start on the third day after chemotherapy.  30 tablet  1  . prochlorperazine (COMPAZINE) 10 MG tablet 1 tab by mouth with meals and bedtime x 3 days after chemo, then 1 tab by mouth every 6 hrs if needed for nausea  30 tablet  2  . ciprofloxacin (CIPRO) 500 MG tablet  Take 1 tablet (500 mg total) by mouth 2 (two) times daily.  14 tablet  0  . hydrochlorothiazide (MICROZIDE) 12.5 MG capsule Take 12.5 mg by mouth daily as needed (headache).       . Prenatal Vit-Fe Fumarate-FA (PRENATAL VITAMIN PO) Take 1 tablet by mouth.       No current facility-administered medications for this visit.    OBJECTIVE:   Filed Vitals:   04/01/14 1105  BP: 113/76  Pulse: 79  Temp: 98.2 F (36.8 C)  Resp: 18     Body mass index is 27.53 kg/(m^2).    ECOG FS: 1 Middle-aged [place] American woman who appears tired but is in no acute distress. Filed Weights   04/01/14 1105  Weight: 191 lb 14.4 oz (87.045 kg)   Physical Exam: HEENT:  Sclerae anicteric.  Oropharynx  moist. Areas of hyperpigmentation on the tongue. No ulcerations.  No evidence of mucositis or oropharyngeal candidiasis.  Neck is supple, trachea midline. No thyromegaly palpated. NODES:  No cervical or supraclavicular lymphadenopathy palpated.  BREAST EXAM:   The palpable mass in the right breast appears noticeably softer than when examined last week, but with minimal  change in the size since her last exam. The mass is movable. There are no obvious skin changes or erythema, and no nipple discharge. There is palpable adenopathy in the right axilla. Left axilla is unremarkable. LUNGS:  Clear to auscultation bilaterally.  No crackles, wheezes or rhonchi HEART:  Regular rate and rhythm. No murmur appreciated. ABDOMEN:  Soft, nontender. No organomegaly or masses palpated. Positive bowel sounds.  MSK:  No focal spinal tenderness to palpation. Full range of motion bilaterally in the upper extremities. EXTREMITIES:  No peripheral edema.  No clubbing or cyanosis.  No lymphedema in the right upper extremity.  SKIN:  No visible rashes. No excessive ecchymoses. No petechiae. Good skin turgor. Skin is warm and dry. Port is intact in the left upper chest wall with no erythema or edema, and no evidence of  infection/cellulitis. NEURO:  Nonfocal. Well oriented. Appropriate  affect.    LAB RESULTS:   Lab Results  Component Value Date   WBC 1.5* 04/01/2014   NEUTROABS 0.8* 04/01/2014   HGB 9.9* 04/01/2014  HCT 31.6* 04/01/2014   MCV 70.5* 04/01/2014   PLT 194 04/01/2014      Chemistry      Component Value Date/Time   NA 139 04/01/2014 1048   NA 140 02/17/2014 1527   K 4.0 04/01/2014 1048   K 4.2 02/17/2014 1527   CL 101 02/17/2014 1527   CO2 28 04/01/2014 1048   CO2 28 02/17/2014 1527   BUN 13.7 04/01/2014 1048   BUN 16 02/17/2014 1527   CREATININE 0.7 04/01/2014 1048   CREATININE 0.78 02/17/2014 1527      Component Value Date/Time   CALCIUM 9.5 04/01/2014 1048   CALCIUM 9.3 02/17/2014 1527   ALKPHOS 87 04/01/2014 1048   ALKPHOS 83 02/17/2014 1527   AST 14 04/01/2014 1048   AST 14 02/17/2014 1527   ALT 14 04/01/2014 1048   ALT 19 02/17/2014 1527   BILITOT 0.99 04/01/2014 1048   BILITOT 1.2 02/17/2014 1527      STUDIES:  Ct Abdomen Pelvis W Contrast 03/04/2014   CLINICAL DATA:  Breast cancer  EXAM: CT ABDOMEN AND PELVIS WITH CONTRAST  TECHNIQUE: Multidetector CT imaging of the abdomen and pelvis was performed using the standard protocol following bolus administration of intravenous contrast.  CONTRAST:  164m OMNIPAQUE IOHEXOL 300 MG/ML  SOLN  COMPARISON:  None.  FINDINGS: The lung bases are clear.  There is no liver abnormality identified. The gallbladder appears normal. No biliary dilatation. Normal appearance of the pancreas. The spleen is unremarkable.  Normal appearance of both adrenal glands. The right kidney appears normal. The left kidney is normal. Urinary bladder appears within normal limits. The uterus and adnexal structures have a normal physiologic appearance.  Normal caliber of the abdominal aorta. There is no aneurysm. There is no pelvic or inguinal adenopathy identified.  The stomach is within normal limits. The small bowel loops are normal. Normal appearance of the colon.  There is no free fluid or  abnormal fluid collections identified. No peritoneal nodule or mass identified.  Review of the visualized osseous structures is significant for mild lumbar spondylosis. There is no aggressive lytic or sclerotic bone lesions.  IMPRESSION: 1. No acute findings and no evidence for metastatic disease within the abdomen or pelvis.   Electronically Signed   By: TKerby MoorsM.D.   On: 03/04/2014 08:15    CT Chest W Contrast 02/10/2014  CLINICAL DATA: Breast cancer.  EXAM:  CT CHEST WITH CONTRAST  TECHNIQUE:  Multidetector CT imaging of the chest was performed during  intravenous contrast administration.  CONTRAST: 841mOMNIPAQUE IOHEXOL 300 MG/ML SOLN  COMPARISON: Breast MRI 01/27/2014  FINDINGS:  Examination was chest wall demonstrates a left-sided Port-A-Cath in  position. No complicating features. The right breast demonstrates  marked skin thickening and diffuse sub areolar tumor with multiple  nodules or intramammary lymph nodes. There is also bulky axillary  and subpectoral adenopathy. The largest axillary lymph node on image  number 19 measures 29 x 16.5 mm.  The bony thorax is intact. No destructive or sclerotic bone lesions.  The heart is normal in size. No pericardial effusion. No mediastinal  or hilar mass or lymphadenopathy. The aorta is normal in caliber.  The esophagus is grossly normal.  Examination of the lung parenchyma demonstrates no acute pulmonary  findings. No worrisome pulmonary lesions to suggest pulmonary  metastatic disease. No pleural effusion.  The upper abdomen is unremarkable.  IMPRESSION:  1. Extensive right-sided breast disease as discussed above and as  seen on the prior  breast MRI. There is bulky right axillary and  subpectoral adenopathy.  2. No findings for pulmonary or osseous metastatic disease.  Electronically Signed  By: Kalman Jewels M.D.  On: 02/10/2014 15:47    Mr Breast Bilateral W Wo Contrast 01/28/2014   CLINICAL DATA:  43 year old female  with recently diagnosed 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  LABS:  Most recent serum creatinine 0.7 milligrams/deciliter.  EXAM: BILATERAL BREAST MRI WITH AND WITHOUT CONTRAST  TECHNIQUE: Multiplanar, multisequence MR images of both breasts were obtained prior to and following the intravenous administration of 39m of MultiHance.  THREE-DIMENSIONAL MR IMAGE RENDERING ON INDEPENDENT WORKSTATION:  Three-dimensional MR images were rendered by post-processing of the original MR data on an independent workstation. The three-dimensional MR images were interpreted, and findings are reported in the following complete MRI report for this study. Three dimensional images were evaluated at the independent DynaCad workstation  COMPARISON:  Previous exams  FINDINGS: Breast composition: c:  Heterogeneous fibroglandular tissue  Background parenchymal enhancement: Moderate  Right breast: Multiple irregular enhancing masses with associated contiguous and surrounding areas of clumped non mass enhancement are seen throughout the central, outer, and upper-outer right breast compatible with known biopsy proven malignancy. This measures up to 9.4 cm in greatest AP dimension, approximately 5 cm transverse, and up to 7 cm craniocaudal. There is diffuse asymmetric skin thickening as well as edema involving the right breast. No obvious nipple involvement. No pectoralis involvement.  Left breast: No suspicious rapidly enhancing masses or abnormal areas of enhancement in the left breast to suggest malignancy. A small oval circumscribed mass in the upper-outer left breast measuring 0.5 x 0.6 x 0.6 cm corresponds with a benign appearing lymph node seen on the recent outside ultrasound.  Lymph nodes: Several enlarged and morphologically abnormal right axillary, interpectoral, and subpectoral lymph nodes are identified compatible with known  biopsy proven axillary metastases, with a representative enlarged right axillary lymph node measuring up to 2.7 cm AP. No internal mammary lymphadenopathy seen.  Ancillary findings:  None.  IMPRESSION: Extensive malignancy involving predominately the central, outer, and upper-outer right breast measuring up to 9.4 cm in greatest AP dimension with associated right axillary (biopsy proven), interpectoral, and subpectoral lymphadenopathy.  RECOMMENDATION: Treatment for known biopsy proven malignancy in the right breast.  BI-RADS CATEGORY  6: Known biopsy-proven malignancy.   Electronically Signed   By: JEverlean AlstromM.D.   On: 01/28/2014 09:25       ASSESSMENT: 43y.o. [place]  (1)   status post right breast and right axillary lymph node biopsy 01/20/2014, both positive for a clinically T3 N1-2, clinical stage III a invasive ductal carcinoma, grade 2, estrogen and progesterone receptor negative, HER-2 positive, with an MIB-1 of 85%  (2)  to be treated in the neoadjuvant setting, the plan being to proceed with 4 dose dense cycles of doxorubicin/cyclophosphamide (first treatment 02/11/2014), with Neulasta on day 2 for granulocyte support. This is to be followed by weekly paclitaxel x12, given along with trastuzumab/pertuzumab every 3 weeks prior to definitive surgery.   (3)  Patient will likely need a right modified radical mastectomy, followed by postmastectomy radiation.   (4)  trastuzumab will be continued every 3 weeks for total of one year.  PLAN:  RLeilyis doing moderately well today.  She tolerated chemotherapy relatively well.  She is fatigued.  I reviewed  her labs with her.  She is slightly neutropenic.  In considering her neutropenia and recent port removal, I prescribed cipro BID for prophylaxis.  I gave her detailed information.    Her last echocardiogram was on 02/05/2014 and demonstrated a LVEF of 60%.    Patient has two children in [place], and  one child that is here.  Her husband is a Optometrist and cannot help the patient with her 65 year old daughter who is here.  She has had difficulty with child care and believes that she may have to take a trip to [place] to leave her daughter with her two other children and her mom until she completes her treatment.  She tells me that she may need a month to do this.  She is planning on leaving on 04/09/14 for this.    The patient's appointment with Dr. Jana Hakim was canceled for an unknown reason.  She will return tomorrow for an MRI of her breasts and on 04/08/14 to re check her labs prior to her departure and for evaluation.    I spent 25 minutes counseling the patient face to face.  The total time spent in the appointment was 30 minutes.  Minette Headland, [place] [phone] 04/01/2014 1:23 PM  At the completion of the appointment, I reviewed the case with Dr. Jana Hakim.  Dr. Jana Hakim informed me that he was called about the patient yesterday during her port removal and although her upper extremity doppler was normal, clots were visualized during the port removal.  Due to this he would like for her to return today for Enoxaparin 54m, and to receive this daily x 3.  He would also like to talk to her.  I requested TJonelle SportsRN to contact the patient and request that she return today.

## 2014-04-01 NOTE — Patient Instructions (Signed)
  Patient Neutropenia Instruction Sheet  Diagnosis: Breast Cancer      Treating Physician: Dr. Jana Hakim  Treatment: 1. Type of chemotherapy: Doxorubicin and Cyclophosphamide 2. Date of last treatment: 03/25/14  Last Blood Counts: Lab Results  Component Value Date   WBC 1.5* 04/01/2014   HGB 9.9* 04/01/2014   HCT 31.6* 04/01/2014   MCV 70.5* 04/01/2014   PLT 194 04/01/2014  ANC 800      Prophylactic Antibiotics: Cipro 500 mg by mouth twice a day Instructions: 1. Monitor temperature and call if fever  greater than 100.5, chills, shaking chills (rigors) 2. Call Physician on-call at [phone] 3. Give him/her symptoms and list of medications that you are taking and your last blood count.

## 2014-04-01 NOTE — Telephone Encounter (Signed)
per pof to sch pt for inj & appt w/ GM on7/8 & 7/9-gave pt updated copy of sch

## 2014-04-02 ENCOUNTER — Ambulatory Visit (HOSPITAL_COMMUNITY)
Admission: RE | Admit: 2014-04-02 | Discharge: 2014-04-02 | Disposition: A | Discharge status: Home / Self Care | Payer: Medicaid Other | Source: Ambulatory Visit | Attending: Oncology | Admitting: Oncology

## 2014-04-02 ENCOUNTER — Ambulatory Visit (HOSPITAL_BASED_OUTPATIENT_CLINIC_OR_DEPARTMENT_OTHER): Payer: Medicaid Other

## 2014-04-02 ENCOUNTER — Other Ambulatory Visit: Payer: Self-pay | Admitting: *Deleted

## 2014-04-02 VITALS — BP 105/83 | HR 91 | Temp 98.1°F

## 2014-04-02 DIAGNOSIS — Z9221 Personal history of antineoplastic chemotherapy: Secondary | ICD-10-CM | POA: Diagnosis not present

## 2014-04-02 DIAGNOSIS — C50919 Malignant neoplasm of unspecified site of unspecified female breast: Secondary | ICD-10-CM | POA: Diagnosis present

## 2014-04-02 DIAGNOSIS — C50411 Malignant neoplasm of upper-outer quadrant of right female breast: Secondary | ICD-10-CM

## 2014-04-02 DIAGNOSIS — C50419 Malignant neoplasm of upper-outer quadrant of unspecified female breast: Secondary | ICD-10-CM

## 2014-04-02 MED ORDER — GADOBENATE DIMEGLUMINE 529 MG/ML IV SOLN
18.0000 mL | Freq: Once | INTRAVENOUS | Status: AC | PRN
  Administered 2014-04-02: 18 mL via INTRAVENOUS

## 2014-04-02 MED ORDER — ENOXAPARIN SODIUM 40 MG/0.4ML ~~LOC~~ SOLN
40.0000 mg | Freq: Every day | SUBCUTANEOUS | Status: DC
  Administered 2014-04-02: 40 mg via SUBCUTANEOUS
  Filled 2014-04-02: qty 0.4

## 2014-04-03 ENCOUNTER — Ambulatory Visit (HOSPITAL_BASED_OUTPATIENT_CLINIC_OR_DEPARTMENT_OTHER): Payer: Medicaid Other | Admitting: Oncology

## 2014-04-03 ENCOUNTER — Telehealth: Payer: Self-pay | Admitting: *Deleted

## 2014-04-03 ENCOUNTER — Telehealth: Payer: Self-pay | Admitting: Oncology

## 2014-04-03 ENCOUNTER — Ambulatory Visit (HOSPITAL_BASED_OUTPATIENT_CLINIC_OR_DEPARTMENT_OTHER): Payer: Medicaid Other

## 2014-04-03 VITALS — BP 108/67 | HR 72 | Temp 98.3°F | Resp 18 | Ht 70.0 in | Wt 190.3 lb

## 2014-04-03 DIAGNOSIS — C50419 Malignant neoplasm of upper-outer quadrant of unspecified female breast: Secondary | ICD-10-CM

## 2014-04-03 DIAGNOSIS — C50411 Malignant neoplasm of upper-outer quadrant of right female breast: Secondary | ICD-10-CM

## 2014-04-03 DIAGNOSIS — C773 Secondary and unspecified malignant neoplasm of axilla and upper limb lymph nodes: Secondary | ICD-10-CM

## 2014-04-03 DIAGNOSIS — Z171 Estrogen receptor negative status [ER-]: Secondary | ICD-10-CM

## 2014-04-03 MED ORDER — ENOXAPARIN SODIUM 40 MG/0.4ML ~~LOC~~ SOLN
40.0000 mg | Freq: Every day | SUBCUTANEOUS | Status: DC
  Administered 2014-04-03: 40 mg via SUBCUTANEOUS
  Filled 2014-04-03: qty 0.4

## 2014-04-03 NOTE — Telephone Encounter (Signed)
, °

## 2014-04-03 NOTE — Telephone Encounter (Signed)
Per staff phone call and POF I have schedueld appts. Scheduler advised of appts.  JMW  

## 2014-04-03 NOTE — Progress Notes (Signed)
[place]  Telephone:([phone]) [phone] Fax:(336) [phone]     ID: Rhonda [name] OB: 1971-02-15  MR#: [id number]  QAS#:[id number]  PCP: Tereasa Coop, PA-C GYN:  Mora Bellman, MD SU: Excell Seltzer, MD OTHER MD: Arloa Koh, MD  CHIEF COMPLAINT: Right breast cancer CURRENT TREATMENT:neoadjuvant chemotherapy  BREAST CANCER HISTORY: From the original intake note:  Rhonda [name] was visiting relatives in [place] about 4 months ago when she noted pain in her right breast. In 2003 she had a right breast infection which felt pretty much the same. She was treated with antibiotics back then (while living in [place]). Accordingly, this time she minimized the symptoms. It felt like milk was coming down she says. She also had breast pain while having her period. Then in February 2015, she actually found 2 lumps in her right breast breast which she initially ignored. Eventually as the symptoms seemed to progress she saw a Dr. In [place] and he told her she likely had breast cancer. She waited until [place] to seek definitive care.  On 12/17/2013 she had bilateral diagnostic mammography in [place] [facility]). In the upper outer right breast there were pleomorphic calcifications measuring approximately 9 cm in diameter. There were 4 distinct microlobulated masses in the right breast, 3 of them in the upper outer quadrant one in the lower outer quadrant. Each measures approximately 1.2 cm. Ultrasound showed a complex cystic mass in the superior central right breast, a 4 mm hypoechoic lesion, 3 suspicious solid masses elsewhere in the right breast, all of them hypoechoic and oval are round with increased vascularity. The largest of these masses measure 1.7 cm. Findings in the left breast were not suspicious or specific. The patient was referred to the breast Center where on 01/20/2014 she underwent biopsy of 2 of the right breast masses, and a right axillary lymph node.  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  On 01/28/2014 the patient underwent bilateral breast MRI. This showed, in the right breast, multiple irregular enhancing masses in the setting of clumped non-masslike enhancement the area in question measured up to 9.4 cm. There was diffuse asymmetric skin thickening and edema involving the right breast, but no obvious nipple or pectoralis involvement. There were several enlarged and morphologically abnormal right axillary lymph node as well as inter-pectoral and subpectoral adenopathy there the largest right axillary lymph node measured 2.7 cm. There was no internal mammary lymphadenopathy noted. The left breast was unremarkable.  Her subsequent history is as detailed below.  INTERVAL HISTORY: Rhonda [name] returns  today for followup of her locally advanced right breast carcinoma accompanied by her daughter and brother-in-law. Today she is receiving the third of 3 prophylactic dose Lovenox doses related to her recent port removal procedure. She tells me she spoke with her husband recently and he is planning to bring the children himself next week. Accordingly the plan is for her to go [place] have been put on hold for now and we discussed resumption of her chemotherapy. In the interval also she has had her repeat breast MRI which shows a significant response to chemotherapy  REVIEW OF SYSTEMS: She continues to feel fatigued, but has had no fever, rash, or bleeding. She doesn't have significant pain related to the port removal. A detailed review of systems was otherwise stable  PAST MEDICAL HISTORY: Past Medical  History  Diagnosis Date  . Hypertension   . Blood transfusion without reported diagnosis     3 previous   . Anemia   . Ulcer     hx of stomach ulcer a long time ago  . GERD (gastroesophageal  reflux disease)     a long time ago  . Headache(784.0)   . Cancer 10/2013    right breast cancer  . Anxiety     PAST SURGICAL HISTORY: Past Surgical History  Procedure Laterality Date  . Cesarean section      2 previous  . Back surgery  2000  . Appendectomy      a long time ago in [place]  . Portacath placement N/A 02/06/2014    Procedure: INSERTION PORT-A-CATH;  Surgeon: Edward Jolly, MD;  Location: [facility];  Service: General;  Laterality: N/A;  . Port-a-cath removal Left 03/31/2014    Procedure: REMOVAL PORT-A-CATH;  Surgeon: Edward Jolly, MD;  Location: [place];  Service: General;  Laterality: Left;    FAMILY HISTORY Family History  Problem Relation Age of Onset  . Hypertension Mother   . Hypertension Father    the patient's parents are living but she is not sure of their age. She had 4 brothers and 4 sisters. 2 other brothers died in the [place] and one brother is currently in [place] but they do not know where are even if he is still alive. She has one brother in this area. There is no history of breast or ovarian cancer in the family to her knowledge.  GYNECOLOGIC HISTORY: (Reviewed 03/18/2014) Menarche somewhere between the ages of 68 and 72. First live birth age 5. The patient is GX P3. Her periods are now irregular. She took birth control pills remotely for approximately one year with no complaints.  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 Her husband Rhonda [name] is a former Emergency planning/management officer to the [place] from [place] and is a Optometrist. Their children are Rhonda [name] (11),Rhonda [name] (8) and Rhonda [name] (4).  The youngest child is currently living with Rhonda [name]. The 2 older children are currently in [place]. The patient attends a [employer] locally    ADVANCED DIRECTIVES: Not in place   HEALTH MAINTENANCE: (Updated 03/18/2014) History  Substance Use Topics  . Smoking status: Never Smoker   . Smokeless  tobacco: Never Used  . Alcohol Use: No     Colonoscopy: Never  PAP: April 2015/Dr. Constant  Bone density: Never  Lipid panel: Not on file  No Known Allergies  Current Outpatient Prescriptions  Medication Sig Dispense Refill  . acetaminophen (TYLENOL) 325 MG tablet Take 650 mg by mouth every 6 (six) hours as needed for mild pain or headache.       . Alum & Mag Hydroxide-Simeth (MAGIC MOUTHWASH W/LIDOCAINE) SOLN Take 5 mLs by mouth 4 (four) times daily as needed for mouth pain.  240 mL  1  . baclofen (LIORESAL) 10 MG tablet Take 10 mg by mouth every morning.       . cholecalciferol (VITAMIN D) 1000 UNITS tablet Take 1,000 Units by mouth daily.      . ciprofloxacin (CIPRO) 500 MG tablet Take 1 tablet (500 mg total) by mouth 2 (two) times daily.  14 tablet  0  . dexamethasone (DECADRON) 4 MG tablet 2 tabs by mouth with food twice daily on day before and 3 days after each chemo  60 tablet  1  .  hydrochlorothiazide (MICROZIDE) 12.5 MG capsule Take 12.5 mg by mouth daily as needed (headache).       Marland Kitchen HYDROcodone-acetaminophen (NORCO/VICODIN) 5-325 MG per tablet Take 1-2 tablets by mouth every 4 (four) hours as needed (pain.).      Marland Kitchen lidocaine-prilocaine (EMLA) cream Apply to port 1 -2 hr before each procedure/port access as directed  30 g  6  . loratadine (CLARITIN) 10 MG tablet Take 10 mg by mouth daily.      Marland Kitchen LORazepam (ATIVAN) 0.5 MG tablet Take 1 tablet (0.5 mg total) by mouth at bedtime as needed for anxiety or sleep (Nausea or vomiting).  30 tablet  0  . omeprazole (PRILOSEC) 40 MG capsule Take 1 capsule (40 mg total) by mouth daily.  30 capsule  6  . ondansetron (ZOFRAN) 8 MG tablet Take 1 tablet (8 mg total) by mouth 2 (two) times daily as needed for nausea or vomiting. Start on the third day after chemotherapy.  30 tablet  1  . Prenatal Vit-Fe Fumarate-FA (PRENATAL VITAMIN PO) Take 1 tablet by mouth.      . prochlorperazine (COMPAZINE) 10 MG tablet 1 tab by mouth with meals and bedtime  x 3 days after chemo, then 1 tab by mouth every 6 hrs if needed for nausea  30 tablet  2   No current facility-administered medications for this visit.    OBJECTIVE:   Filed Vitals:   04/03/14 1306  BP: 108/67  Pulse: 72  Temp: 98.3 F (36.8 C)  Resp: 53     middle-aged African woman who appears tired but is in no acute distress. Filed Weights   04/03/14 1306  Weight: 190 lb 4.8 oz (86.32 kg)   Full exam today deferred  Port site intact  LAB RESULTS:   Lab Results  Component Value Date   WBC 1.5* 04/01/2014   NEUTROABS 0.8* 04/01/2014   HGB 9.9* 04/01/2014   HCT 31.6* 04/01/2014   MCV 70.5* 04/01/2014   PLT 194 04/01/2014      Chemistry      Component Value Date/Time   NA 139 04/01/2014 1048   NA 140 02/17/2014 1527   K 4.0 04/01/2014 1048   K 4.2 02/17/2014 1527   CL 101 02/17/2014 1527   CO2 28 04/01/2014 1048   CO2 28 02/17/2014 1527   BUN 13.7 04/01/2014 1048   BUN 16 02/17/2014 1527   CREATININE 0.7 04/01/2014 1048   CREATININE 0.78 02/17/2014 1527      Component Value Date/Time   CALCIUM 9.5 04/01/2014 1048   CALCIUM 9.3 02/17/2014 1527   ALKPHOS 87 04/01/2014 1048   ALKPHOS 83 02/17/2014 1527   AST 14 04/01/2014 1048   AST 14 02/17/2014 1527   ALT 14 04/01/2014 1048   ALT 19 02/17/2014 1527   BILITOT 0.99 04/01/2014 1048   BILITOT 1.2 02/17/2014 1527      STUDIES: Mr Breast Bilateral W Wo Contrast  04/02/2014   CLINICAL DATA:  Status post neoadjuvant chemotherapy for right breast invasive ductal carcinoma. Recently removed left anterior chest porta catheter (removed on 03/31/2014.  LABS:  None obtained today.  EXAM: BILATERAL BREAST MRI WITH AND WITHOUT CONTRAST  TECHNIQUE: Multiplanar, multisequence MR images of both breasts were obtained prior to and following the intravenous administration of 81m of MultiHance.  THREE-DIMENSIONAL MR IMAGE RENDERING ON INDEPENDENT WORKSTATION:  Three-dimensional MR images were rendered by post-processing of the original MR data on an independent  workstation. The three-dimensional MR images were interpreted, and  findings are reported in the following complete MRI report for this study. Three dimensional images were evaluated at the independent DynaCad workstation  COMPARISON:  Previous examinations, including the previous breast MR dated 01/27/2014.  FINDINGS: Breast composition: c:  Heterogeneous fibroglandular tissue  Background parenchymal enhancement: Minimal  Right breast: The previously demonstrated masses in the lateral right breast are smaller or resolved. The largest residual mass measures 9 x 7 mm in maximum dimensions on image number 113 of the initial postcontrast sequence. This previously measured 14 x 11 mm.  The previously described contiguous non mass enhancement in the right breast is again demonstrated, currently measuring 8.7 x 6.3 x 5.7 cm in maximum dimensions. This previously measured 9.4 x 7 x 5 cm in maximum dimensions.  Left breast: Interval postoperative cavity in the upper inner left breast with surrounding enhancement following porta catheter removal on 03/31/2014. Otherwise, no mass or abnormal enhancement seen.  Lymph nodes: Interval decrease in size of multiple enlarged right axillary nodes. The largest node currently measures 17 x 8 mm in maximum dimensions on image number 148 of the first post-contrast sequence and previously measured 28 x 13 mm.  Ancillary findings:  None.  IMPRESSION: 1. Interval partial response to chemotherapy in the right breast. 2. Interval partial response of right axillary adenopathy to chemotherapy. 3. No new areas of malignancy.  RECOMMENDATION: Treatment plan.  BI-RADS CATEGORY  6: Known biopsy-proven malignancy.   Electronically Signed   By: Enrique Sack M.D.   On: 04/02/2014 16:16   Ir Cv Line Injection  03/26/2014   CLINICAL DATA:  History of breast cancer.  Pain at Port-A-Cath site.  EXAM: PORT-A-CATH INJECTION WITH FLUOROSCOPY  Physician: Stephan Minister. Henn, MD  FLUOROSCOPY TIME:  30 seconds   MEDICATIONS: None  ANESTHESIA/SEDATION: Moderate sedation time: None  PROCEDURE: Patient was placed supine on the interventional table. Fluoroscopic images of the chest were obtained. The initial access needle placement was malpositioned. A new access needle was placed by the radiology nurse. Contrast was then injected through the Port-A-Cath. The port was flushed with heparinized saline. The access needle was removed.  FINDINGS: The patient has a left subclavian Port-A-Cath. The catheter tip is at the junction of the left innominate vein and SVC. The Port-A-Cath is horizontally oriented. The port does not aspirate. Contrast injection demonstrates filling defects and irregularity in the left innominate vein and upper SVC consistent with a fibrin sheath and thrombus. The lower SVC appears to be patent. No evidence for catheter discontinuity or extravasation.  COMPLICATIONS: None  IMPRESSION: Catheter tip at the junction of the left innominate vein and SVC and probably up against the vessel wall. In addition, there are multiple filling defects in the left innominate vein and upper SVC consistent with nonocclusive thrombus and a fibrin sheath.  Recommend removal of this Port-A-Cath or revision.   Electronically Signed   By: Markus Daft M.D.   On: 03/26/2014 14:12   ASSESSMENT: 43 y.o. [place] woman [place]  (1)   status post right breast and right axillary lymph node biopsy 01/20/2014, both positive for a clinically T3 N1-2, clinical stage III a invasive ductal carcinoma, grade 2, estrogen and progesterone receptor negative, HER-2 positive, with an MIB-1 of 85%  (2)  to be treated in the neoadjuvant setting, the plan being to proceed with 4 dose dense cycles of doxorubicin/cyclophosphamide (first treatment 02/11/2014), with Neulasta on day 2 for granulocyte support. This is to be followed by weekly paclitaxel x12,  given along with trastuzumab/pertuzumab every 3 weeks prior to definitive  surgery.   (3)  Patient will likely need a right modified radical mastectomy, followed by postmastectomy radiation.   (4)  trastuzumab will be continued every 3 weeks for total of one year.  PLAN: Valda had become very despondent and discouraged. As the first part of her chemotherapy was very rough. I have encouraged her, leading her knowledge she has had a very good response, and at the second part of treatment is much easier. She does not want to start until 04/15/2014 so she can spend a little time with her family. She is of course not entirely sure that her husband is in fact going to be bringing the children next week. If she does start the 21st, we can consider a PICC line temporarily while we plan to have her port replaced. Otherwise I will try to arrange for Dr. Excell Seltzer to replace the port shortly before that treatment.  Original has a good understanding of the overall plan. She agrees with that. She knows the goal of treatment in her case is cure. She will call with any problems that may develop before the next visit here.

## 2014-04-04 ENCOUNTER — Other Ambulatory Visit (INDEPENDENT_AMBULATORY_CARE_PROVIDER_SITE_OTHER): Payer: Self-pay | Admitting: General Surgery

## 2014-04-08 ENCOUNTER — Ambulatory Visit: Payer: Medicaid Other | Admitting: Oncology

## 2014-04-08 ENCOUNTER — Other Ambulatory Visit: Payer: Medicaid Other

## 2014-04-08 ENCOUNTER — Ambulatory Visit: Payer: Medicaid Other

## 2014-04-09 ENCOUNTER — Emergency Department (HOSPITAL_COMMUNITY): Payer: Medicaid Other

## 2014-04-09 ENCOUNTER — Telehealth: Payer: Self-pay | Admitting: *Deleted

## 2014-04-09 ENCOUNTER — Encounter (HOSPITAL_COMMUNITY): Payer: Self-pay | Admitting: Emergency Medicine

## 2014-04-09 ENCOUNTER — Emergency Department (HOSPITAL_COMMUNITY)
Admission: EM | Admit: 2014-04-09 | Discharge: 2014-04-09 | Disposition: A | Discharge status: Home / Self Care | Payer: Medicaid Other | Attending: Emergency Medicine | Admitting: Emergency Medicine

## 2014-04-09 DIAGNOSIS — Z853 Personal history of malignant neoplasm of breast: Secondary | ICD-10-CM | POA: Diagnosis not present

## 2014-04-09 DIAGNOSIS — Z862 Personal history of diseases of the blood and blood-forming organs and certain disorders involving the immune mechanism: Secondary | ICD-10-CM | POA: Insufficient documentation

## 2014-04-09 DIAGNOSIS — K219 Gastro-esophageal reflux disease without esophagitis: Secondary | ICD-10-CM | POA: Diagnosis not present

## 2014-04-09 DIAGNOSIS — F411 Generalized anxiety disorder: Secondary | ICD-10-CM | POA: Diagnosis not present

## 2014-04-09 DIAGNOSIS — Z8719 Personal history of other diseases of the digestive system: Secondary | ICD-10-CM | POA: Insufficient documentation

## 2014-04-09 DIAGNOSIS — R42 Dizziness and giddiness: Secondary | ICD-10-CM | POA: Diagnosis not present

## 2014-04-09 DIAGNOSIS — I10 Essential (primary) hypertension: Secondary | ICD-10-CM | POA: Insufficient documentation

## 2014-04-09 DIAGNOSIS — Z792 Long term (current) use of antibiotics: Secondary | ICD-10-CM | POA: Insufficient documentation

## 2014-04-09 DIAGNOSIS — Z79899 Other long term (current) drug therapy: Secondary | ICD-10-CM | POA: Diagnosis not present

## 2014-04-09 LAB — CBC WITH DIFFERENTIAL/PLATELET
BASOS ABS: 0 10*3/uL (ref 0.0–0.1)
BASOS PCT: 0 % (ref 0–1)
EOS ABS: 0 10*3/uL (ref 0.0–0.7)
Eosinophils Relative: 0 % (ref 0–5)
HCT: 30.4 % — ABNORMAL LOW (ref 36.0–46.0)
Hemoglobin: 9.5 g/dL — ABNORMAL LOW (ref 12.0–15.0)
Lymphocytes Relative: 24 % (ref 12–46)
Lymphs Abs: 1.4 10*3/uL (ref 0.7–4.0)
MCH: 22.2 pg — ABNORMAL LOW (ref 26.0–34.0)
MCHC: 31.3 g/dL (ref 30.0–36.0)
MCV: 71 fL — ABNORMAL LOW (ref 78.0–100.0)
MONOS PCT: 20 % — AB (ref 3–12)
Monocytes Absolute: 1.1 10*3/uL — ABNORMAL HIGH (ref 0.1–1.0)
NEUTROS PCT: 56 % (ref 43–77)
Neutro Abs: 3.3 10*3/uL (ref 1.7–7.7)
PLATELETS: 188 10*3/uL (ref 150–400)
RBC: 4.28 MIL/uL (ref 3.87–5.11)
RDW: 18.9 % — ABNORMAL HIGH (ref 11.5–15.5)
WBC: 5.8 10*3/uL (ref 4.0–10.5)

## 2014-04-09 LAB — BASIC METABOLIC PANEL
ANION GAP: 11 (ref 5–15)
BUN: 17 mg/dL (ref 6–23)
CO2: 27 mEq/L (ref 19–32)
Calcium: 9.4 mg/dL (ref 8.4–10.5)
Chloride: 103 mEq/L (ref 96–112)
Creatinine, Ser: 0.78 mg/dL (ref 0.50–1.10)
GFR calc Af Amer: >90 mL/min (ref >90)
Glucose, Bld: 94 mg/dL (ref 70–99)
POTASSIUM: 4.5 meq/L (ref 3.7–5.3)
Sodium: 141 mEq/L (ref 137–147)

## 2014-04-09 MED ORDER — MECLIZINE HCL 25 MG PO TABS: 25.0000 mg | ORAL_TABLET | Freq: Three times a day (TID) | ORAL | Status: DC | PRN

## 2014-04-09 MED ORDER — ONDANSETRON HCL 4 MG/2ML IJ SOLN
4.0000 mg | Freq: Once | INTRAMUSCULAR | Status: AC
  Administered 2014-04-09: 4 mg via INTRAVENOUS
  Filled 2014-04-09: qty 2

## 2014-04-09 MED ORDER — GADOBENATE DIMEGLUMINE 529 MG/ML IV SOLN
15.0000 mL | Freq: Once | INTRAVENOUS | Status: AC | PRN
  Administered 2014-04-09: 15 mL via INTRAVENOUS

## 2014-04-09 MED ORDER — SODIUM CHLORIDE 0.9 % IV BOLUS (SEPSIS)
1000.0000 mL | Freq: Once | INTRAVENOUS | Status: AC
  Administered 2014-04-09: 1000 mL via INTRAVENOUS

## 2014-04-09 MED ORDER — MECLIZINE HCL 25 MG PO TABS
25.0000 mg | ORAL_TABLET | Freq: Once | ORAL | Status: AC
  Administered 2014-04-09: 25 mg via ORAL
  Filled 2014-04-09: qty 1

## 2014-04-09 MED ORDER — DIAZEPAM 5 MG PO TABS
5.0000 mg | ORAL_TABLET | Freq: Two times a day (BID) | ORAL | Status: DC
Start: 2014-04-09 — End: 2015-07-24

## 2014-04-09 NOTE — ED Provider Notes (Signed)
CSN: [id number]     Arrival date & time 04/09/14  1733 History   First MD Initiated Contact with Patient 04/09/14 1800     Chief Complaint  Patient presents with  . Dizziness     (Consider location/radiation/quality/duration/timing/severity/associated sxs/prior Treatment) HPI  This is a 43 year old female with a history of locally invasive right-sided breast cancer currently on chemotherapy, hypertension, GERD who presents with dizziness. Patient states that she woke up this morning and had room spinning dizziness. It is not worsened with head movement. It seemed to get better on its own. She denies any nausea, or vomiting. Patient states that she has had multiple episodes today of room spinning dizziness. She states that she has to hold onto something to keep her balance. She denies any weakness, numbness, or tingling anywhere. She denies any shortness of breath or abdominal pain. Patient does report chest pain over her left chest. She relates this to recent port removal.  Past Medical History  Diagnosis Date  . Hypertension   . Blood transfusion without reported diagnosis     3 previous   . Anemia   . Ulcer     hx of stomach ulcer a long time ago  . GERD (gastroesophageal reflux disease)     a long time ago  . Headache(784.0)   . Cancer 10/2013    right breast cancer  . Anxiety    Past Surgical History  Procedure Laterality Date  . Cesarean section      2 previous  . Back surgery  2000  . Appendectomy      a long time ago in [place]  . Portacath placement N/A 02/06/2014    Procedure: INSERTION PORT-A-CATH;  Surgeon: Edward Jolly, MD;  Location: [facility];  Service: General;  Laterality: N/A;  . Port-a-cath removal Left 03/31/2014    Procedure: REMOVAL PORT-A-CATH;  Surgeon: Edward Jolly, MD;  Location: [facility];  Service: General;  Laterality: Left;   Family History  Problem Relation Age of Onset  . Hypertension Mother   . Hypertension Father     History  Substance Use Topics  . Smoking status: Never Smoker   . Smokeless tobacco: Never Used  . Alcohol Use: No   OB History   Grav Para Term Preterm Abortions TAB SAB Ect Mult Living   5 3 3  2  2   5      Review of Systems  Constitutional: Negative for fever.  Respiratory: Negative for chest tightness and shortness of breath.   Cardiovascular: Positive for chest pain. Negative for leg swelling.  Gastrointestinal: Positive for nausea. Negative for vomiting and abdominal pain.  Genitourinary: Negative for dysuria.  Musculoskeletal: Negative for back pain.  Skin: Negative for wound.  Neurological: Positive for dizziness and light-headedness. Negative for weakness, numbness and headaches.  Psychiatric/Behavioral: Negative for confusion.  All other systems reviewed and are negative.     Allergies  Review of patient's allergies indicates no known allergies.  Home Medications   Prior to Admission medications   Medication Sig Start Date End Date Taking? Authorizing Provider  acetaminophen (TYLENOL) 325 MG tablet Take 650 mg by mouth every 6 (six) hours as needed for mild pain or headache.    Yes Historical Provider, MD  Alum & Mag Hydroxide-Simeth (MAGIC MOUTHWASH W/LIDOCAINE) SOLN Take 5 mLs by mouth 4 (four) times daily as needed for mouth pain. 03/11/14  Yes Amy Milda Smart, PA-C  baclofen (LIORESAL) 10 MG tablet Take 10 mg by  mouth every morning.  01/07/14 01/07/15 Yes Historical Provider, MD  cholecalciferol (VITAMIN D) 1000 UNITS tablet Take 1,000 Units by mouth daily.   Yes Historical Provider, MD  ciprofloxacin (CIPRO) 500 MG tablet Take 1 tablet (500 mg total) by mouth 2 (two) times daily. 04/01/14  Yes Minette Headland, NP  dexamethasone (DECADRON) 4 MG tablet Take 8 mg by mouth 2 (two) times daily. Take for three days after chemo   Yes Historical Provider, MD  hydrochlorothiazide (MICROZIDE) 12.5 MG capsule Take 12.5 mg by mouth daily as needed (headache).  01/22/14 01/22/15  Yes Historical Provider, MD  HYDROcodone-acetaminophen (NORCO/VICODIN) 5-325 MG per tablet Take 1-2 tablets by mouth every 4 (four) hours as needed (pain.). 02/06/14  Yes Edward Jolly, MD  lidocaine-prilocaine (EMLA) cream Apply to port 1 -2 hr before each procedure/port access as directed 02/05/14  Yes Amy Milda Smart, PA-C  loratadine (CLARITIN) 10 MG tablet Take 10 mg by mouth daily.   Yes Historical Provider, MD  omeprazole (PRILOSEC) 40 MG capsule Take 1 capsule (40 mg total) by mouth daily. 02/05/14  Yes Amy Milda Smart, PA-C  ondansetron (ZOFRAN) 8 MG tablet Take 8 mg by mouth 2 (two) times daily as needed for nausea or vomiting (start the third day after chemo).   Yes Historical Provider, MD  prochlorperazine (COMPAZINE) 10 MG tablet Take 10 mg by mouth every 6 (six) hours as needed for nausea or vomiting (Take for three days after chemo).   Yes Historical Provider, MD  diazepam (VALIUM) 5 MG tablet Take 1 tablet (5 mg total) by mouth 2 (two) times daily. 04/09/14   Merryl Hacker, MD  meclizine (ANTIVERT) 25 MG tablet Take 1 tablet (25 mg total) by mouth 3 (three) times daily as needed. 04/09/14   Merryl Hacker, MD   BP 128/73  Pulse 58  Temp(Src) 97.8 F (36.6 C) (Oral)  Resp 18  Ht 5\' 10"  (1.778 m)  Wt 190 lb (86.183 kg)  BMI 27.26 kg/m2  SpO2 100%  LMP 11/02/2013 Physical Exam  Nursing note and vitals reviewed. Constitutional: She is oriented to person, place, and time. No distress.  HENT:  Head: Normocephalic and atraumatic.  Right Ear: External ear normal.  Left Ear: External ear normal.  Mouth/Throat: Oropharynx is clear and moist.  Alopecia  Eyes: EOM are normal. Pupils are equal, round, and reactive to light.  Neck: Neck supple.  Cardiovascular: Normal rate, regular rhythm and normal heart sounds.   No murmur heard. Pulmonary/Chest: Effort normal and breath sounds normal. No respiratory distress. She has no wheezes.  Abdominal: Soft. Bowel sounds are normal.   Musculoskeletal: She exhibits no edema.  Neurological: She is alert and oriented to person, place, and time.  Cranial nerves II through XII intact, no dysmetria to finger-nose-finger, 5 out of 5 strength in the bilateral upper and lower extremities  Skin: Skin is warm and dry.  Psychiatric: She has a normal mood and affect.    ED Course  Procedures (including critical care time) Labs Review Labs Reviewed  CBC WITH DIFFERENTIAL - Abnormal; Notable for the following:    Hemoglobin 9.5 (*)    HCT 30.4 (*)    MCV 71.0 (*)    MCH 22.2 (*)    RDW 18.9 (*)    Monocytes Relative 20 (*)    Monocytes Absolute 1.1 (*)    All other components within normal limits  BASIC METABOLIC PANEL    Imaging Review Ct Head Wo Contrast  04/09/2014   CLINICAL DATA:  RIGHT temporal and frontal headache, dizziness, history breast cancer on chemotherapy, history hypertension  EXAM: CT HEAD WITHOUT CONTRAST  TECHNIQUE: Contiguous axial images were obtained from the base of the skull through the vertex without intravenous contrast.  COMPARISON:  None  FINDINGS: Normal ventricular morphology.  No midline shift or mass effect.  Normal appearance of brain parenchyma.  No intracranial hemorrhage, mass lesion or evidence acute infarction.  No extra-axial fluid collections.  Bones and sinuses unremarkable.  IMPRESSION: No acute intracranial abnormalities.   Electronically Signed   By: Lavonia Dana M.D.   On: 04/09/2014 18:53   Mr Jeri Cos EX Contrast  04/09/2014   CLINICAL DATA:  Intermittent vertigo with room spinning since waking this morning.  EXAM: MRI HEAD WITHOUT AND WITH CONTRAST  TECHNIQUE: Multiplanar, multiecho pulse sequences of the brain and surrounding structures were obtained without and with intravenous contrast.  CONTRAST:  69mL MULTIHANCE GADOBENATE DIMEGLUMINE 529 MG/ML IV SOLN  COMPARISON:  CT head without contrast 04/09/2014  FINDINGS: Extensive subcortical T2 hyperintensities are present bilaterally.  More minimal periventricular changes are seen. The diffusion-weighted images demonstrate no evidence for acute or subacute infarction. There is loss of normal T1 marrow signal. Midline structures are otherwise within normal limits.  Flow is present in the major intracranial arteries. The globes and orbits are intact. The postcontrast images demonstrates no pathologic enhancement.  IMPRESSION: 1. Extensive subcortical white matter changes bilaterally. More minimal periventricular change. The finding is nonspecific but can be seen in the setting of chronic microvascular ischemia, a demyelinating process such as multiple sclerosis, vasculitis, complicated migraine headaches, or as the sequelae of a prior infectious or inflammatory process. This could be related to chemotherapy or radiation treatment as well. 2. No acute intracranial abnormality. 3. Loss of normal T1 marrow signal in the upper cervical spine and calvarium compatible with the patient's known anemia.   Electronically Signed   By: Lawrence Santiago M.D.   On: 04/09/2014 20:49     EKG Interpretation None     Medications  ondansetron (ZOFRAN) injection 4 mg (4 mg Intravenous Given 04/09/14 1912)  sodium chloride 0.9 % bolus 1,000 mL (0 mLs Intravenous Stopped 04/09/14 1915)  meclizine (ANTIVERT) tablet 25 mg (25 mg Oral Given 04/09/14 1911)  gadobenate dimeglumine (MULTIHANCE) injection 15 mL (15 mLs Intravenous Contrast Given 04/09/14 2040)    MDM   Final diagnoses:  Vertigo    Patient presents with recent dizziness. History of breast cancer and currently on chemotherapy. She is nonfocal on exam. No active nausea or vomiting. Would have concern for metastatic cancer. We'll obtain initial screening CT. Basic labwork obtained. Statics negative. Patient given Valium and meclizine.  She was also given a normal saline bolus. CT scan is negative. We'll proceed with MRI with and without contrast to evaluate for brain metastasis. MRI is negative but  does show evidence of subcortical white matter changes which are nonspecific. On repeat evaluation, patient reports improvement of symptoms. We'll discharge home with meclizine. Have encouraged patient to followup with oncologist as soon as possible.  After history, exam, and medical workup I feel the patient has been appropriately medically screened and is safe for discharge home. Pertinent diagnoses were discussed with the patient. Patient was given return precautions.     Merryl Hacker, MD 04/09/14 2231

## 2014-04-09 NOTE — Telephone Encounter (Signed)
Returning patient phone call.  She states she has been dizzy off and on all day and that she has been seeing black.  Instructed patient to call 911 and/or have someone bring her to the ED.  She states that her brother in law is with her and she would rather him take her to ED.  She states she will go now. Patient also states her husband is now bring the kids here and she will not have go to [place].  She also complains of chest discomfort where her port was removed.  Informed her that this would be sore, but if she has problems or this persists to call the surgeon's office.  She verbalized understanding.

## 2014-04-09 NOTE — ED Notes (Signed)
Pt. In MRI.

## 2014-04-09 NOTE — ED Notes (Signed)
Pt out of room for CT 

## 2014-04-09 NOTE — Discharge Instructions (Signed)

## 2014-04-09 NOTE — ED Notes (Signed)
Pt states she woke up this morning and felt like she was spinning in bed, states the dizziness has been on/off, then has been lightheaded at times, denies any new pain, denies n/v/d, states is a CA pt so nausea is not new.

## 2014-04-10 ENCOUNTER — Ambulatory Visit (HOSPITAL_COMMUNITY)
Admission: RE | Admit: 2014-04-10 | Discharge: 2014-04-10 | Disposition: A | Discharge status: Home / Self Care | Payer: Medicaid Other | Source: Ambulatory Visit | Attending: Internal Medicine | Admitting: Internal Medicine

## 2014-04-10 ENCOUNTER — Ambulatory Visit (HOSPITAL_BASED_OUTPATIENT_CLINIC_OR_DEPARTMENT_OTHER)
Admission: RE | Admit: 2014-04-10 | Discharge: 2014-04-10 | Disposition: A | Discharge status: Home / Self Care | Payer: Medicaid Other | Source: Ambulatory Visit | Attending: Internal Medicine | Admitting: Internal Medicine

## 2014-04-10 VITALS — BP 122/80 | HR 58 | Wt 192.2 lb

## 2014-04-10 DIAGNOSIS — C50919 Malignant neoplasm of unspecified site of unspecified female breast: Secondary | ICD-10-CM | POA: Diagnosis not present

## 2014-04-10 DIAGNOSIS — I079 Rheumatic tricuspid valve disease, unspecified: Secondary | ICD-10-CM | POA: Insufficient documentation

## 2014-04-10 DIAGNOSIS — C50419 Malignant neoplasm of upper-outer quadrant of unspecified female breast: Secondary | ICD-10-CM

## 2014-04-10 DIAGNOSIS — C50411 Malignant neoplasm of upper-outer quadrant of right female breast: Secondary | ICD-10-CM

## 2014-04-10 NOTE — Progress Notes (Signed)
Echocardiogram 2D Echocardiogram Limited  has been performed.  Rhonda [name] 04/10/2014, 10:38 AM

## 2014-04-10 NOTE — Patient Instructions (Addendum)
We will contact you in 3 months to schedule your next appointment and echocardiogram  

## 2014-04-11 ENCOUNTER — Encounter (HOSPITAL_BASED_OUTPATIENT_CLINIC_OR_DEPARTMENT_OTHER): Payer: Self-pay | Admitting: *Deleted

## 2014-04-11 NOTE — Progress Notes (Signed)
Was going to go to [place] to get children-did not go-they are coming here-she had PAC clot-taken out-getting a new one

## 2014-04-11 NOTE — Progress Notes (Signed)
Patient ID: Rhonda [name], female   DOB: 08/25/1971, 43 y.o.   MRN: [id number] PCP: Dr. Jana Hakim  43 yo with breast cancer diagnosed in 3/15 presents for initial evaluation at cardio-oncology clinic.  Cancer is ER/PR receptor negative, HER2/neu positive.  She had positive lymph nodes.  She completed 4 cycles doxorubicin and cyclophosphamide (begun 5/15).  Getting weekly Paclitaxel + trastuzumab/perbuzumab every 3 wks.  Will then have trastuzumab every 3 wks x 1 year.  She will need right mastectomy with radiation.   She has no history of cardiac problems.  She denies exertional dyspnea or chest pain.  She has good exercise tolerance.  She was in the ER last night with dizziness.  She had head MRI which did not show evidence for metastasis.    PMH: 1. HTN 2. PUD 3. Anemia 4. H/o appendectomy 5. Breast cancer: Diagnosed in 3/15, ER/PR negative, HER2/neu positive.  She had positive lymph nodes.  She completed 4 cycles doxorubicin and cyclophosphamide (begun 5/15).  Getting weekly Paclitaxel + trastuzumab/perbuzumab every 3 wks.  Will then have trastuzumab every 3 wks x 1 year.  She will need right mastectomy with radiation.   - Echo (7/15) with EF 55-60%, lateral s' 13.4 cm/sec, GLS -19.6%  SH: Married, husband is the former [place] ambassador to [place], 3 kids, lives in [place].  No smoking or ETOH.   FH: No history of cardiac disease that she knows of.   ROS: All systems reviewed and negative except as per HPI.   Current Outpatient Prescriptions  Medication Sig Dispense Refill  . acetaminophen (TYLENOL) 325 MG tablet Take 650 mg by mouth every 6 (six) hours as needed for mild pain or headache.       . Alum & Mag Hydroxide-Simeth (MAGIC MOUTHWASH W/LIDOCAINE) SOLN Take 5 mLs by mouth 4 (four) times daily as needed for mouth pain.  240 mL  1  . baclofen (LIORESAL) 10 MG tablet Take 10 mg by mouth every morning.       . cholecalciferol (VITAMIN D) 1000 UNITS tablet Take 1,000 Units by mouth  daily.      Marland Kitchen dexamethasone (DECADRON) 4 MG tablet Take 8 mg by mouth 2 (two) times daily. Take for three days after chemo      . diazepam (VALIUM) 5 MG tablet Take 1 tablet (5 mg total) by mouth 2 (two) times daily.  10 tablet  0  . hydrochlorothiazide (MICROZIDE) 12.5 MG capsule Take 12.5 mg by mouth daily as needed (headache).       Marland Kitchen HYDROcodone-acetaminophen (NORCO/VICODIN) 5-325 MG per tablet Take 1-2 tablets by mouth every 4 (four) hours as needed (pain.).      Marland Kitchen lidocaine-prilocaine (EMLA) cream Apply to port 1 -2 hr before each procedure/port access as directed  30 g  6  . loratadine (CLARITIN) 10 MG tablet Take 10 mg by mouth daily.      . meclizine (ANTIVERT) 25 MG tablet Take 1 tablet (25 mg total) by mouth 3 (three) times daily as needed.  30 tablet  0  . omeprazole (PRILOSEC) 40 MG capsule Take 1 capsule (40 mg total) by mouth daily.  30 capsule  6  . ondansetron (ZOFRAN) 8 MG tablet Take 8 mg by mouth 2 (two) times daily as needed for nausea or vomiting (start the third day after chemo).      . prochlorperazine (COMPAZINE) 10 MG tablet Take 10 mg by mouth every 6 (six) hours as needed for nausea or vomiting (  Take for three days after chemo).       No current facility-administered medications for this encounter.    BP 122/80  Pulse 58  Wt 192 lb 4 oz (87.204 kg)  SpO2 99%  LMP 11/02/2013 General: NAD Neck: No JVD, no thyromegaly or thyroid nodule.  Lungs: Clear to auscultation bilaterally with normal respiratory effort. CV: Nondisplaced PMI.  Heart regular S1/S2, no S3/S4, no murmur.  No peripheral edema.  No carotid bruit.  Normal pedal pulses.  Abdomen: Soft, nontender, no hepatosplenomegaly, no distention.  Skin: Intact without lesions or rashes.  Neurologic: Alert and oriented x 3.  Psych: Normal affect. Extremities: No clubbing or cyanosis.  HEENT: Normal.   Assessment/Plan: 43 yo with breast cancer.  She had initial chemotherapy including doxorubicin (now  completed).  She is going to be getting Herceptin every 3 wks for a year now.  We discussed the purpose of this clinic and the cardiac risk from doxorubicin and from Herceptin.  I reviewed her echo today, which shows normal EF and a normal lateral s' and strain pattern.  She will continue Herceptin.  We will have her followup with an echo in 3 months to screen for cardiac damage.    Loralie Champagne 04/11/2014

## 2014-04-14 ENCOUNTER — Encounter (HOSPITAL_BASED_OUTPATIENT_CLINIC_OR_DEPARTMENT_OTHER): Payer: Medicaid Other | Admitting: Anesthesiology

## 2014-04-14 ENCOUNTER — Encounter (HOSPITAL_BASED_OUTPATIENT_CLINIC_OR_DEPARTMENT_OTHER)
Admission: RE | Disposition: A | Discharge status: Home / Self Care | Payer: Self-pay | Source: Ambulatory Visit | Attending: General Surgery

## 2014-04-14 ENCOUNTER — Ambulatory Visit (HOSPITAL_COMMUNITY): Payer: Medicaid Other

## 2014-04-14 ENCOUNTER — Ambulatory Visit (HOSPITAL_BASED_OUTPATIENT_CLINIC_OR_DEPARTMENT_OTHER)
Admission: RE | Admit: 2014-04-14 | Discharge: 2014-04-14 | Disposition: A | Discharge status: Home / Self Care | Payer: Medicaid Other | Source: Ambulatory Visit | Attending: General Surgery | Admitting: General Surgery

## 2014-04-14 ENCOUNTER — Encounter (HOSPITAL_BASED_OUTPATIENT_CLINIC_OR_DEPARTMENT_OTHER): Payer: Self-pay | Admitting: Anesthesiology

## 2014-04-14 ENCOUNTER — Ambulatory Visit (HOSPITAL_BASED_OUTPATIENT_CLINIC_OR_DEPARTMENT_OTHER): Payer: Medicaid Other | Admitting: Anesthesiology

## 2014-04-14 DIAGNOSIS — Z79899 Other long term (current) drug therapy: Secondary | ICD-10-CM | POA: Insufficient documentation

## 2014-04-14 DIAGNOSIS — Z452 Encounter for adjustment and management of vascular access device: Secondary | ICD-10-CM | POA: Insufficient documentation

## 2014-04-14 DIAGNOSIS — D649 Anemia, unspecified: Secondary | ICD-10-CM | POA: Insufficient documentation

## 2014-04-14 DIAGNOSIS — C7960 Secondary malignant neoplasm of unspecified ovary: Secondary | ICD-10-CM | POA: Diagnosis not present

## 2014-04-14 DIAGNOSIS — Z8711 Personal history of peptic ulcer disease: Secondary | ICD-10-CM | POA: Insufficient documentation

## 2014-04-14 DIAGNOSIS — F411 Generalized anxiety disorder: Secondary | ICD-10-CM | POA: Diagnosis not present

## 2014-04-14 DIAGNOSIS — I10 Essential (primary) hypertension: Secondary | ICD-10-CM | POA: Insufficient documentation

## 2014-04-14 DIAGNOSIS — C50919 Malignant neoplasm of unspecified site of unspecified female breast: Secondary | ICD-10-CM | POA: Insufficient documentation

## 2014-04-14 DIAGNOSIS — C50411 Malignant neoplasm of upper-outer quadrant of right female breast: Secondary | ICD-10-CM

## 2014-04-14 DIAGNOSIS — Z9221 Personal history of antineoplastic chemotherapy: Secondary | ICD-10-CM | POA: Insufficient documentation

## 2014-04-14 HISTORY — PX: PORTACATH PLACEMENT: SHX2246

## 2014-04-14 LAB — POCT HEMOGLOBIN-HEMACUE: Hemoglobin: 8.8 g/dL — ABNORMAL LOW (ref 12.0–15.0)

## 2014-04-14 SURGERY — INSERTION, TUNNELED CENTRAL VENOUS DEVICE, WITH PORT
Anesthesia: General | Site: Neck | Laterality: Right

## 2014-04-14 MED ORDER — EPHEDRINE SULFATE 50 MG/ML IJ SOLN
INTRAMUSCULAR | Status: DC | PRN
  Administered 2014-04-14: 10 mg via INTRAVENOUS

## 2014-04-14 MED ORDER — PROPOFOL 10 MG/ML IV BOLUS
INTRAVENOUS | Status: AC
Start: 2014-04-14 — End: 2014-04-14
  Filled 2014-04-14: qty 40

## 2014-04-14 MED ORDER — OXYCODONE HCL 5 MG/5ML PO SOLN: 5.0000 mg | Freq: Once | ORAL | Status: DC | PRN

## 2014-04-14 MED ORDER — FENTANYL CITRATE 0.05 MG/ML IJ SOLN
25.0000 ug | INTRAMUSCULAR | Status: DC | PRN
  Administered 2014-04-14: 25 ug via INTRAVENOUS
  Administered 2014-04-14: 50 ug via INTRAVENOUS
  Administered 2014-04-14: 25 ug via INTRAVENOUS

## 2014-04-14 MED ORDER — HEPARIN SOD (PORK) LOCK FLUSH 100 UNIT/ML IV SOLN
INTRAVENOUS | Status: AC
  Filled 2014-04-14: qty 5

## 2014-04-14 MED ORDER — METOCLOPRAMIDE HCL 5 MG/ML IJ SOLN: 10.0000 mg | Freq: Once | INTRAMUSCULAR | Status: DC | PRN

## 2014-04-14 MED ORDER — MIDAZOLAM HCL 2 MG/2ML IJ SOLN: 1.0000 mg | INTRAMUSCULAR | Status: DC | PRN

## 2014-04-14 MED ORDER — MIDAZOLAM HCL 5 MG/5ML IJ SOLN
INTRAMUSCULAR | Status: DC | PRN
  Administered 2014-04-14: 2 mg via INTRAVENOUS

## 2014-04-14 MED ORDER — DEXAMETHASONE SODIUM PHOSPHATE 4 MG/ML IJ SOLN
INTRAMUSCULAR | Status: DC | PRN
  Administered 2014-04-14: 10 mg via INTRAVENOUS

## 2014-04-14 MED ORDER — FENTANYL CITRATE 0.05 MG/ML IJ SOLN
INTRAMUSCULAR | Status: AC
  Filled 2014-04-14: qty 2

## 2014-04-14 MED ORDER — HYDROMORPHONE HCL PF 1 MG/ML IJ SOLN
INTRAMUSCULAR | Status: AC
  Filled 2014-04-14: qty 1

## 2014-04-14 MED ORDER — HEPARIN (PORCINE) IN NACL 2-0.9 UNIT/ML-% IJ SOLN
INTRAMUSCULAR | Status: DC | PRN
  Administered 2014-04-14: 500 mL via INTRAVENOUS

## 2014-04-14 MED ORDER — FENTANYL CITRATE 0.05 MG/ML IJ SOLN
INTRAMUSCULAR | Status: AC
  Filled 2014-04-14: qty 4

## 2014-04-14 MED ORDER — HEPARIN SOD (PORK) LOCK FLUSH 100 UNIT/ML IV SOLN
INTRAVENOUS | Status: DC | PRN
  Administered 2014-04-14: 500 [IU] via INTRAVENOUS

## 2014-04-14 MED ORDER — ENOXAPARIN SODIUM 40 MG/0.4ML ~~LOC~~ SOLN
SUBCUTANEOUS | Status: AC
  Filled 2014-04-14: qty 0.4

## 2014-04-14 MED ORDER — CEFAZOLIN SODIUM-DEXTROSE 2-3 GM-% IV SOLR
INTRAVENOUS | Status: AC
  Filled 2014-04-14: qty 50

## 2014-04-14 MED ORDER — FENTANYL CITRATE 0.05 MG/ML IJ SOLN
INTRAMUSCULAR | Status: DC | PRN
  Administered 2014-04-14 (×2): 50 ug via INTRAVENOUS

## 2014-04-14 MED ORDER — PROPOFOL 10 MG/ML IV BOLUS
INTRAVENOUS | Status: DC | PRN
  Administered 2014-04-14: 200 mg via INTRAVENOUS

## 2014-04-14 MED ORDER — OXYCODONE HCL 5 MG PO TABS: 5.0000 mg | ORAL_TABLET | Freq: Once | ORAL | Status: DC | PRN

## 2014-04-14 MED ORDER — FENTANYL CITRATE 0.05 MG/ML IJ SOLN: 50.0000 ug | INTRAMUSCULAR | Status: DC | PRN

## 2014-04-14 MED ORDER — BUPIVACAINE-EPINEPHRINE (PF) 0.25% -1:200000 IJ SOLN
INTRAMUSCULAR | Status: AC
  Filled 2014-04-14: qty 30

## 2014-04-14 MED ORDER — MIDAZOLAM HCL 2 MG/2ML IJ SOLN
INTRAMUSCULAR | Status: AC
  Filled 2014-04-14: qty 2

## 2014-04-14 MED ORDER — LIDOCAINE HCL (CARDIAC) 20 MG/ML IV SOLN
INTRAVENOUS | Status: DC | PRN
  Administered 2014-04-14: 50 mg via INTRAVENOUS

## 2014-04-14 MED ORDER — BUPIVACAINE-EPINEPHRINE 0.25% -1:200000 IJ SOLN
INTRAMUSCULAR | Status: DC | PRN
  Administered 2014-04-14: 10 mL

## 2014-04-14 MED ORDER — HEPARIN (PORCINE) IN NACL 2-0.9 UNIT/ML-% IJ SOLN
INTRAMUSCULAR | Status: AC
  Filled 2014-04-14: qty 500

## 2014-04-14 MED ORDER — CEFAZOLIN SODIUM-DEXTROSE 2-3 GM-% IV SOLR
2.0000 g | INTRAVENOUS | Status: AC
  Administered 2014-04-14: 2 g via INTRAVENOUS

## 2014-04-14 MED ORDER — LACTATED RINGERS IV SOLN
INTRAVENOUS | Status: DC
  Administered 2014-04-14: 14:00:00 via INTRAVENOUS
  Administered 2014-04-14: 10 mL/h via INTRAVENOUS

## 2014-04-14 SURGICAL SUPPLY — 44 items
BAG DECANTER FOR FLEXI CONT (MISCELLANEOUS) ×3 IMPLANT
BANDAGE ADH SHEER 1  50/CT (GAUZE/BANDAGES/DRESSINGS) ×3 IMPLANT
BLADE SURG 15 STRL LF DISP TIS (BLADE) ×1 IMPLANT
BLADE SURG 15 STRL SS (BLADE) ×2
CHLORAPREP W/TINT 26ML (MISCELLANEOUS) ×3 IMPLANT
COVER MAYO STAND STRL (DRAPES) ×3 IMPLANT
COVER TABLE BACK 60X90 (DRAPES) ×3 IMPLANT
DECANTER SPIKE VIAL GLASS SM (MISCELLANEOUS) IMPLANT
DERMABOND ADVANCED (GAUZE/BANDAGES/DRESSINGS) ×2
DERMABOND ADVANCED .7 DNX12 (GAUZE/BANDAGES/DRESSINGS) ×1 IMPLANT
DRAPE C-ARM 42X72 X-RAY (DRAPES) ×3 IMPLANT
DRAPE LAPAROTOMY T 102X78X121 (DRAPES) ×3 IMPLANT
DRAPE UTILITY XL STRL (DRAPES) ×3 IMPLANT
ELECT REM PT RETURN 9FT ADLT (ELECTROSURGICAL) ×3
ELECTRODE REM PT RTRN 9FT ADLT (ELECTROSURGICAL) ×1 IMPLANT
GLOVE BIOGEL PI IND STRL 7.0 (GLOVE) ×1 IMPLANT
GLOVE BIOGEL PI IND STRL 8 (GLOVE) ×1 IMPLANT
GLOVE BIOGEL PI INDICATOR 7.0 (GLOVE) ×2
GLOVE BIOGEL PI INDICATOR 8 (GLOVE) ×2
GLOVE ECLIPSE 6.5 STRL STRAW (GLOVE) ×3 IMPLANT
GLOVE SS BIOGEL STRL SZ 7.5 (GLOVE) ×1 IMPLANT
GLOVE SUPERSENSE BIOGEL SZ 7.5 (GLOVE) ×2
GOWN STRL REUS W/ TWL LRG LVL3 (GOWN DISPOSABLE) ×1 IMPLANT
GOWN STRL REUS W/ TWL XL LVL3 (GOWN DISPOSABLE) ×1 IMPLANT
GOWN STRL REUS W/TWL LRG LVL3 (GOWN DISPOSABLE) ×2
GOWN STRL REUS W/TWL XL LVL3 (GOWN DISPOSABLE) ×2
IV CATH PLACEMENT UNIT 16 GA (IV SOLUTION) IMPLANT
IV KIT MINILOC 20X1 SAFETY (NEEDLE) IMPLANT
KIT BARDPORT ISP (Port) IMPLANT
KIT PORT POWER 8FR ISP CVUE (Catheter) ×3 IMPLANT
NEEDLE HYPO 22GX1.5 SAFETY (NEEDLE) ×3 IMPLANT
NEEDLE HYPO 25X1 1.5 SAFETY (NEEDLE) ×3 IMPLANT
PACK BASIN DAY SURGERY FS (CUSTOM PROCEDURE TRAY) ×3 IMPLANT
PENCIL BUTTON HOLSTER BLD 10FT (ELECTRODE) ×3 IMPLANT
SET SHEATH INTRODUCER 10FR (MISCELLANEOUS) IMPLANT
SHEATH COOK PEEL AWAY SET 9F (SHEATH) IMPLANT
SLEEVE SCD COMPRESS KNEE MED (MISCELLANEOUS) ×3 IMPLANT
SUT MON AB 4-0 PC3 18 (SUTURE) ×3 IMPLANT
SUT PROLENE 2 0 CT2 30 (SUTURE) ×3 IMPLANT
SUT SILK 4 0 TIES 17X18 (SUTURE) IMPLANT
SYR 5ML LUER SLIP (SYRINGE) ×3 IMPLANT
SYRINGE CONTROL L 12CC (SYRINGE) ×3 IMPLANT
TOWEL OR 17X24 6PK STRL BLUE (TOWEL DISPOSABLE) ×3 IMPLANT
TOWEL OR NON WOVEN STRL DISP B (DISPOSABLE) IMPLANT

## 2014-04-14 NOTE — Anesthesia Procedure Notes (Signed)
Procedure Name: LMA Insertion Date/Time: 04/14/2014 2:22 PM Performed by: Lyndee Leo Pre-anesthesia Checklist: Patient identified, Emergency Drugs available, Suction available and Patient being monitored Patient Re-evaluated:Patient Re-evaluated prior to inductionOxygen Delivery Method: Circle System Utilized Preoxygenation: Pre-oxygenation with 100% oxygen Intubation Type: IV induction Ventilation: Mask ventilation without difficulty LMA: LMA inserted LMA Size: 4.0 Number of attempts: 1 Airway Equipment and Method: bite block Placement Confirmation: positive ETCO2 Tube secured with: Tape Dental Injury: Teeth and Oropharynx as per pre-operative assessment

## 2014-04-14 NOTE — Anesthesia Preprocedure Evaluation (Signed)
Anesthesia Evaluation  Patient identified by MRN, date of birth, ID band Patient awake    Reviewed: Allergy & Precautions, H&P , NPO status , Patient's Chart, lab work & pertinent test results, reviewed documented beta blocker date and time   Airway Mallampati: II TM Distance: >3 FB Neck ROM: full    Dental   Pulmonary neg pulmonary ROS,  breath sounds clear to auscultation        Cardiovascular hypertension, On Medications Rhythm:regular     Neuro/Psych  Headaches, negative psych ROS   GI/Hepatic Neg liver ROS, GERD-  Medicated and Controlled,  Endo/Other  negative endocrine ROS  Renal/GU negative Renal ROS  negative genitourinary   Musculoskeletal   Abdominal   Peds  Hematology  (+) anemia ,   Anesthesia Other Findings See surgeon's H&P   Reproductive/Obstetrics negative OB ROS                           Anesthesia Physical Anesthesia Plan  ASA: II  Anesthesia Plan: General   Post-op Pain Management:    Induction: Intravenous  Airway Management Planned: LMA  Additional Equipment:   Intra-op Plan:   Post-operative Plan:   Informed Consent: I have reviewed the patients History and Physical, chart, labs and discussed the procedure including the risks, benefits and alternatives for the proposed anesthesia with the patient or authorized representative who has indicated his/her understanding and acceptance.   Dental Advisory Given  Plan Discussed with: CRNA and Surgeon  Anesthesia Plan Comments:         Anesthesia Quick Evaluation

## 2014-04-14 NOTE — Discharge Instructions (Signed)
° ° °PORT-A-CATH: POST OP INSTRUCTIONS ° °Always review your discharge instruction sheet given to you by the facility where your surgery was performed.  ° °1. A prescription for pain medication may be given to you upon discharge. Take your pain medication as prescribed, if needed. If narcotic pain medicine is not needed, then you make take acetaminophen (Tylenol) or ibuprofen (Advil) as needed.  °2. Take your usually prescribed medications unless otherwise directed. °3. If you need a refill on your pain medication, please contact our office. All narcotic pain medicine now requires a paper prescription.  Phoned in and fax refills are no longer allowed by law.  Prescriptions will not be filled after 5 pm or on weekends.  °4. You should follow a light diet for the remainder of the day after your procedure. °5. Most patients will experience some mild swelling and/or bruising in the area of the incision. It may take several days to resolve. °6. It is common to experience some constipation if taking pain medication after surgery. Increasing fluid intake and taking a stool softener (such as Colace) will usually help or prevent this problem from occurring. A mild laxative (Milk of Magnesia or Miralax) should be taken according to package directions if there are no bowel movements after 48 hours.  °7. Unless discharge instructions indicate otherwise, you may remove your bandages 48 hours after surgery, and you may shower at that time. You may have steri-strips (small white skin tapes) in place directly over the incision.  These strips should be left on the skin for 7-10 days.  If your surgeon used Dermabond (skin glue) on the incision, you may shower in 24 hours.  The glue will flake off over the next 2-3 weeks.  °8. If your port is left accessed at the end of surgery (needle left in port), the dressing cannot get wet and should only by changed by a healthcare professional. When the port is no longer accessed (when the  needle has been removed), follow step 7.   °9. ACTIVITIES:  Limit activity involving your arms for the next 72 hours. Do no strenuous exercise or activity for 1 week. You may drive when you are no longer taking prescription pain medication, you can comfortably wear a seatbelt, and you can maneuver your car. °10.You may need to see your doctor in the office for a follow-up appointment.  Please °      check with your doctor.  °11.When you receive a new Port-a-Cath, you will get a product guide and  °      ID card.  Please keep them in case you need them. ° °WHEN TO CALL YOUR DOCTOR ([phone]): °1. Fever over 101.0 °2. Chills °3. Continued bleeding from incision °4. Increased redness and tenderness at the site °5. Shortness of breath, difficulty breathing ° ° °The clinic staff is available to answer your questions during regular business hours. Please don’t hesitate to call and ask to speak to one of the nurses or medical assistants for clinical concerns. If you have a medical emergency, go to the nearest emergency room or call 911.  A surgeon from [facility] is always on call at the hospital.  ° ° ° °For further information, please visit [email or web address] ° ° °Post Anesthesia Home Care Instructions ° °Activity: °Get plenty of rest for the remainder of the day. A responsible adult should stay with you for 24 hours following the procedure.  °For the next 24 hours, DO NOT: °-Drive a car °-  Operate machinery °-Drink alcoholic beverages °-Take any medication unless instructed by your physician °-Make any legal decisions or sign important papers. ° °Meals: °Start with liquid foods such as gelatin or soup. Progress to regular foods as tolerated. Avoid greasy, spicy, heavy foods. If nausea and/or vomiting occur, drink only clear liquids until the nausea and/or vomiting subsides. Call your physician if vomiting continues. ° °Special Instructions/Symptoms: °Your throat may feel dry or sore from the  anesthesia or the breathing tube placed in your throat during surgery. If this causes discomfort, gargle with warm salt water. The discomfort should disappear within 24 hours. ° ° ° ° ° ° °

## 2014-04-14 NOTE — Transfer of Care (Signed)
Immediate Anesthesia Transfer of Care Note  Patient: Rhonda [name]  Procedure(s) Performed: Procedure(s): PLACEMENT OF PORT-A-CATH (Right)  Patient Location: PACU  Anesthesia Type:General  Level of Consciousness: awake, sedated and patient cooperative  Airway & Oxygen Therapy: Patient Spontanous Breathing and Patient connected to face mask oxygen  Post-op Assessment: Report given to PACU RN and Post -op Vital signs reviewed and stable  Post vital signs: Reviewed and stable  Complications: No apparent anesthesia complications

## 2014-04-14 NOTE — H&P (Signed)
  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 She previously had an initially uncomplicated Port-A-Cath placed via left subclavian vein. During treatment she developed pain in her left shoulder and arm and chest. Injection of the catheter showed it had flipped up into the right innominate vein and there was some clot around the catheter. The catheter is removed and she has received a short course of Lovenox. She requires ongoing IV access for chemotherapy and now presents for placement of a new Port-A-Cath.  Past Medical History  Diagnosis Date  . Hypertension   . Blood transfusion without reported diagnosis     3 previous   . Anemia   . Ulcer     hx of stomach ulcer a long time ago  . GERD (gastroesophageal reflux disease)     a long time ago  . Headache(784.0)   . Cancer 10/2013    right breast cancer  . Anxiety    Past Surgical History  Procedure Laterality Date  . Cesarean section      2 previous  . Back surgery  2000  . Appendectomy      a long time ago in [place]  . Portacath placement N/A 02/06/2014    Procedure: INSERTION PORT-A-CATH;  Surgeon: Edward Jolly, MD;  Location: [facility];  Service: General;  Laterality: N/A;  . Port-a-cath removal Left 03/31/2014    Procedure: REMOVAL PORT-A-CATH;  Surgeon: Edward Jolly, MD;  Location: [facility];  Service: General;  Laterality: Left;   Current Facility-Administered Medications  Medication Dose Route Frequency Provider Last Rate Last Dose  . [START ON 04/15/2014] ceFAZolin (ANCEF) IVPB 2 g/50 mL premix  2 g Intravenous On Call to OR Edward Jolly, MD      . fentaNYL (SUBLIMAZE) injection 50-100 mcg  50-100 mcg Intravenous PRN Arabella Merles, MD      . lactated ringers infusion   Intravenous Continuous Arabella Merles, MD 10 mL/hr at 04/14/14 1353    . midazolam (VERSED) injection 1-2 mg  1-2 mg Intravenous PRN Arabella Merles, MD        Exam: BP 131/91  Pulse 69  Temp(Src) 98.3 F (36.8 C) (Oral)  Resp 20  Ht 5\' 10"  (1.778 m)  Wt 192 lb (87.091 kg)  BMI 27.55 kg/m2  SpO2 99%  LMP 11/02/2013 General: Alert, well-developed African female, in no distress Skin: Warm and dry without rash or infection. HEENT: No palpable masses or thyromegaly. Sclera nonicteric. Pupils equal round and reactive. Oropharynx clear. Lymph nodes: No cervical, supraclavicular, or inguinal nodes palpable. Lungs: Breath sounds clear and equal without increased work of breathing Cardiovascular: Regular rate and rhythm without murmur. No JVD or edema. Peripheral pulses intact. Healing Port-A-Cath incision left chest Extremities: No edema or joint swelling or deformity. No chronic venous stasis changes. Neurologic: Alert and fully oriented.    Assessment and plan: Locally advanced cancer of the right breast requiring replacement of Port-A-Cath with previous history of displaced Port-A-Cath left subclavian vein with clot. Plan to replace Port-A-Cath via right internal jugular vein. Discussed options with the patient and she understands obviously was a Port-A-Cath including bleeding, infection, thrombosis, occlusion and displacement. She agrees to proceed.   Edward Jolly MD, FACS  04/14/2014, 2:06 PM

## 2014-04-14 NOTE — Op Note (Signed)
Preoperative diagnosis: Cancer of the breast and the poor venous access  Postoperative diagnosis: Same  Procedure: Placement of ClearVue subcutaneous venous port  Surgeon: Excell Seltzer M.D.  Anesthesia: LMA General  Description of procedure: Patient is brought to the operating room and placed in the supine position on the operating table. IV sedation was administered. The entire upper chest and neck were widely sterilely prepped and draped. Local anesthesia was used to infiltrate the insertion of port site. The right internal jugular vein was cannulated with a needle and guidewire without difficulty and position in the superior vena cava was confirmed by fluoroscopy. The introducer was then placed over the guidewire and the flushed catheter placed via the introducer which was stripped away and the tip of the catheter positioned near the cavoatrial junction.  Appropriate venous pressure blood return was obtained.  A small transverse incision was made in the anterior chest wall and subcutaneous pocket created. The catheter was tunneled into the pocket, trimmed to length, and attached to the flushed port which was positioned in the pocket. The port was sutured to the chest wall with interrupted 2-0 Prolene. The incisions were closed with subcutaneous interrupted Monocryl and the skin incisions closed with subcuticular Monocryl and Dermabond. The port was accessed and flushed and aspirated easily and was left flushed with concentrated heparin solution. Sponge needle as the counts were correct. The patient was taken to recovery in good condition.  Rhonda [name] T  04/14/2014

## 2014-04-14 NOTE — Anesthesia Postprocedure Evaluation (Signed)
Anesthesia Post Note  Patient: Rhonda [name]  Procedure(s) Performed: Procedure(s) (LRB): PLACEMENT OF PORT-A-CATH (Right)  Anesthesia type: General  Patient location: PACU  Post pain: Pain level controlled  Post assessment: Patient's Cardiovascular Status Stable  Last Vitals:  Filed Vitals:   04/14/14 1545  BP: 118/60  Pulse: 55  Temp:   Resp: 11    Post vital signs: Reviewed and stable  Level of consciousness: alert  Complications: No apparent anesthesia complications

## 2014-04-15 ENCOUNTER — Ambulatory Visit (HOSPITAL_BASED_OUTPATIENT_CLINIC_OR_DEPARTMENT_OTHER): Payer: Medicaid Other | Admitting: Oncology

## 2014-04-15 ENCOUNTER — Ambulatory Visit: Payer: Medicaid Other

## 2014-04-15 ENCOUNTER — Encounter (HOSPITAL_BASED_OUTPATIENT_CLINIC_OR_DEPARTMENT_OTHER): Payer: Self-pay | Admitting: General Surgery

## 2014-04-15 ENCOUNTER — Telehealth: Payer: Self-pay | Admitting: Oncology

## 2014-04-15 ENCOUNTER — Other Ambulatory Visit (HOSPITAL_BASED_OUTPATIENT_CLINIC_OR_DEPARTMENT_OTHER): Payer: Medicaid Other

## 2014-04-15 ENCOUNTER — Other Ambulatory Visit: Payer: Medicaid Other

## 2014-04-15 VITALS — BP 132/72 | HR 62 | Temp 98.0°F | Resp 18 | Ht 70.0 in | Wt 195.4 lb

## 2014-04-15 DIAGNOSIS — Z171 Estrogen receptor negative status [ER-]: Secondary | ICD-10-CM

## 2014-04-15 DIAGNOSIS — C50411 Malignant neoplasm of upper-outer quadrant of right female breast: Secondary | ICD-10-CM

## 2014-04-15 DIAGNOSIS — C50919 Malignant neoplasm of unspecified site of unspecified female breast: Secondary | ICD-10-CM

## 2014-04-15 DIAGNOSIS — C50419 Malignant neoplasm of upper-outer quadrant of unspecified female breast: Secondary | ICD-10-CM

## 2014-04-15 DIAGNOSIS — C773 Secondary and unspecified malignant neoplasm of axilla and upper limb lymph nodes: Secondary | ICD-10-CM

## 2014-04-15 LAB — CBC WITH DIFFERENTIAL/PLATELET
BASO%: 0.4 % (ref 0.0–2.0)
BASOS ABS: 0 10*3/uL (ref 0.0–0.1)
EOS%: 0 % (ref 0.0–7.0)
Eosinophils Absolute: 0 10*3/uL (ref 0.0–0.5)
HCT: 31.1 % — ABNORMAL LOW (ref 34.8–46.6)
HEMOGLOBIN: 9.6 g/dL — AB (ref 11.6–15.9)
LYMPH%: 22.5 % (ref 14.0–49.7)
MCH: 22.3 pg — ABNORMAL LOW (ref 25.1–34.0)
MCHC: 31 g/dL — ABNORMAL LOW (ref 31.5–36.0)
MCV: 72 fL — ABNORMAL LOW (ref 79.5–101.0)
MONO#: 1.1 10*3/uL — ABNORMAL HIGH (ref 0.1–0.9)
MONO%: 20.4 % — ABNORMAL HIGH (ref 0.0–14.0)
NEUT%: 56.7 % (ref 38.4–76.8)
NEUTROS ABS: 3 10*3/uL (ref 1.5–6.5)
PLATELETS: 308 10*3/uL (ref 145–400)
RBC: 4.33 10*6/uL (ref 3.70–5.45)
RDW: 20.2 % — ABNORMAL HIGH (ref 11.2–14.5)
WBC: 5.4 10*3/uL (ref 3.9–10.3)
lymph#: 1.2 10*3/uL (ref 0.9–3.3)

## 2014-04-15 LAB — COMPREHENSIVE METABOLIC PANEL (CC13)
ALT: 14 U/L (ref 0–55)
ANION GAP: 8 meq/L (ref 3–11)
AST: 16 U/L (ref 5–34)
Albumin: 3.2 g/dL — ABNORMAL LOW (ref 3.5–5.0)
Alkaline Phosphatase: 65 U/L (ref 40–150)
BILIRUBIN TOTAL: 0.27 mg/dL (ref 0.20–1.20)
BUN: 7.5 mg/dL (ref 7.0–26.0)
CO2: 26 mEq/L (ref 22–29)
Calcium: 9.5 mg/dL (ref 8.4–10.4)
Chloride: 108 mEq/L (ref 98–109)
Creatinine: 0.7 mg/dL (ref 0.6–1.1)
GLUCOSE: 110 mg/dL (ref 70–140)
Potassium: 3.9 mEq/L (ref 3.5–5.1)
Sodium: 142 mEq/L (ref 136–145)
TOTAL PROTEIN: 6.9 g/dL (ref 6.4–8.3)

## 2014-04-15 NOTE — Telephone Encounter (Signed)
per pof to sch pt appt-gave pt copy of sch °

## 2014-04-15 NOTE — Progress Notes (Signed)
[facility]  Telephone:(336) [phone] Fax:(336) [phone]     ID: Rhonda [name] OB: 1971-03-28  MR#: [id number]  QPR#:[id number]  PCP: Rhonda Coop, PA-C GYN:  Rhonda Bellman, MD SU: Rhonda Seltzer, MD OTHER MD: Rhonda Koh, MD  CHIEF COMPLAINT: Right breast cancer CURRENT TREATMENT:neoadjuvant chemotherapy  BREAST CANCER HISTORY: From the original intake note:  Rhonda [name] was visiting relatives [place] about 4 months ago when she noted pain Rhonda her right breast. Rhonda 2003 she had a right breast infection which felt pretty much the same. She was treated with antibiotics back then (while living [place]onda [place]). Accordingly, this time she minimized the symptoms. It felt like milk was coming down she says. She also had breast pain while having her period. Then Rhonda February 2015, she actually found 2 lumps Rhonda her right breast breast which she initially ignored. Eventually as the symptoms seemed to progress she saw a Dr. [name] and he told her she likely had breast cancer. She waited until [place] the [place] to seek definitive care.  On 12/17/2013 she had bilateral diagnostic mammography Rhonda [facility]). Rhonda the upper outer right breast there were pleomorphic calcifications measuring approximately 9 cm Rhonda diameter. There were 4 distinct microlobulated masses Rhonda the right breast, 3 of them Rhonda the upper outer quadrant one Rhonda the lower outer quadrant. Each measures approximately 1.2 cm. Ultrasound showed a complex cystic mass Rhonda the superior central right breast, a 4 mm hypoechoic lesion, 3 suspicious solid masses elsewhere Rhonda the right breast, all of them hypoechoic and oval are round with increased vascularity. The largest of these masses measure 1.7 cm. Findings Rhonda the left breast were not suspicious or specific. The patient was referred to the breast Center where on 01/20/2014 she underwent biopsy of 2 of the right breast masses, and a right axillary lymph node.  All were positive for invasive ductal carcinoma grade 2, estrogen and progesterone receptor negative, with an Rhonda [name] of 85%, and HER-2 amplified, the signals ratio being 3.81 and the number per cell 8.95.  On 01/28/2014 the patient underwent bilateral breast MRI. This showed, Rhonda the right breast, multiple irregular enhancing masses Rhonda the setting of clumped non-masslike enhancement the area Rhonda question measured up to 9.4 cm. There was diffuse asymmetric skin thickening and edema involving the right breast, but no obvious nipple or pectoralis involvement. There were several enlarged and morphologically abnormal right axillary lymph node as well as inter-pectoral and subpectoral adenopathy there the largest right axillary lymph node measured 2.7 cm. There was no internal mammary lymphadenopathy noted. The left breast was unremarkable.  Her subsequent history is as detailed below.  INTERVAL HISTORY: Rhonda [name] returns today for followup of her locally advanced breast cancer. The interval history is significant for her port having migrated. It had to be removed 03/31/2014. It was replaced 04/14/2014. Her husband was supposed to have come from [place] with her 2 children they are (a son age 44 and a daughter age 54) but that still hasn't happened. She talk to her children daily. Of course she has her 10-year-old daughter with her here. Overall, though, she is no longer planning to travel to [place] until she completes her systemic and local treatment for breast cancer here. She is appropriately concerned that she would not be able to obtain the right treatment outside of the [place].  REVIEW OF SYSTEMS: She has largely recovered from the side effects of the original chemotherapy. She still sleeps poorly, and describes  herself as mildly fatigued. Deep Port-A-Cath on the right side has just been placed at still a bit sore. She has occasional headaches. Otherwise a detailed review of systems today was noncontributory  PAST  MEDICAL HISTORY: Past Medical History  Diagnosis Date  . Hypertension   . Blood transfusion without reported diagnosis     3 previous   . Anemia   . Ulcer     hx of stomach ulcer a long time ago  . GERD (gastroesophageal reflux disease)     a long time ago  . Headache(784.0)   . Cancer 10/2013    right breast cancer  . Anxiety     PAST SURGICAL HISTORY: Past Surgical History  Procedure Laterality Date  . Cesarean section      2 previous  . Back surgery  2000  . Appendectomy      a long time ago [place]  . Portacath placement N/A 02/06/2014    Procedure: INSERTION PORT-A-CATH;  Surgeon: Edward Jolly, MD;  Location: [facility];  Service: General;  Laterality: N/A;  . Port-a-cath removal Left 03/31/2014    Procedure: REMOVAL PORT-A-CATH;  Surgeon: Edward Jolly, MD;  Location: [facility];  Service: General;  Laterality: Left;  . Portacath placement Right 04/14/2014    Procedure: PLACEMENT OF PORT-A-CATH;  Surgeon: Edward Jolly, MD;  Location: [facility];  Service: General;  Laterality: Right;    FAMILY HISTORY Family History  Problem Relation Age of Onset  . Hypertension Mother   . Hypertension Father    the patient's parents are living but she is not sure of their age. She had 4 brothers and 4 sisters. 2 other brothers died [place]onda the turmoil [place] and one brother is currently [place] but they do not know where are even if he is still alive. She has one brother Rhonda this area. There is no history of breast or ovarian cancer Rhonda the family to her knowledge.  GYNECOLOGIC HISTORY: (Reviewed 03/18/2014) Menarche somewhere between the ages of 28 and 42. First live birth age 4. The patient is GX P3. Her periods are now irregular. She took birth control pills remotely for approximately one year with no complaints.  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 Her husband Rhonda [name] is a former Emergency planning/management officer  to the [place] from [place] and is a Optometrist. Their children are Rhonda [name] (11),Rhonda [name] (8) and Nyewech (4).  The youngest child is currently living with Apolonio Schneiders. The 2 older children are currently Rhonda [place]. The patient attends a [employer] locally    ADVANCED DIRECTIVES: Not Rhonda place   HEALTH MAINTENANCE: (Updated 03/18/2014) History  Substance Use Topics  . Smoking status: Never Smoker   . Smokeless tobacco: Never Used  . Alcohol Use: No     Colonoscopy: Never  PAP: April 2015/Dr. Constant  Bone density: Never  Lipid panel: Not on file  No Known Allergies  Current Outpatient Prescriptions  Medication Sig Dispense Refill  . acetaminophen (TYLENOL) 325 MG tablet Take 650 mg by mouth every 6 (six) hours as needed for mild pain or headache.       . Alum & Mag Hydroxide-Simeth (MAGIC MOUTHWASH W/LIDOCAINE) SOLN Take 5 mLs by mouth 4 (four) times daily as needed for mouth pain.  240 mL  1  . baclofen (LIORESAL) 10 MG tablet Take 10 mg by mouth every morning.       . cholecalciferol (  VITAMIN D) 1000 UNITS tablet Take 1,000 Units by mouth daily.      Marland Kitchen dexamethasone (DECADRON) 4 MG tablet Take 8 mg by mouth 2 (two) times daily. Take for three days after chemo      . diazepam (VALIUM) 5 MG tablet Take 1 tablet (5 mg total) by mouth 2 (two) times daily.  10 tablet  0  . hydrochlorothiazide (MICROZIDE) 12.5 MG capsule Take 12.5 mg by mouth daily as needed (headache).       Marland Kitchen HYDROcodone-acetaminophen (NORCO/VICODIN) 5-325 MG per tablet Take 1-2 tablets by mouth every 4 (four) hours as needed (pain.).      Marland Kitchen lidocaine-prilocaine (EMLA) cream Apply to port 1 -2 hr before each procedure/port access as directed  30 g  6  . loratadine (CLARITIN) 10 MG tablet Take 10 mg by mouth daily.      . meclizine (ANTIVERT) 25 MG tablet Take 1 tablet (25 mg total) by mouth 3 (three) times daily as needed.  30 tablet  0  . omeprazole (PRILOSEC) 40 MG capsule Take 1 capsule (40 mg total) by mouth  daily.  30 capsule  6  . ondansetron (ZOFRAN) 8 MG tablet Take 8 mg by mouth 2 (two) times daily as needed for nausea or vomiting (start the third day after chemo).      . prochlorperazine (COMPAZINE) 10 MG tablet Take 10 mg by mouth every 6 (six) hours as needed for nausea or vomiting (Take for three days after chemo).       No current facility-administered medications for this visit.    OBJECTIVE:    Filed Vitals:   04/15/14 1226  BP: 132/72  Pulse: 62  Temp: 98 F (36.7 C)  Resp: 18   Filed Weights   04/15/14 1226  Weight: 195 lb 6.4 oz (88.633 kg)      middle-aged African woman Rhonda no acute distress  Sclerae unicteric, pupils equal and reactive Oropharynx clear and moist No cervical or supraclavicular adenopathy Lungs no rales or rhonchi Heart regular rate and rhythm Abd soft, nontender, positive bowel sounds MSK no focal spinal tenderness, no upper extremity lymphedema Neuro: nonfocal, well oriented, appropriate affect Breasts: Deferred   LAB RESULTS:   Lab Results  Component Value Date   WBC 5.4 04/15/2014   NEUTROABS 3.0 04/15/2014   HGB 9.6* 04/15/2014   HCT 31.1* 04/15/2014   MCV 72.0* 04/15/2014   PLT 308 04/15/2014      Chemistry      Component Value Date/Time   NA 142 04/15/2014 1215   NA 141 04/09/2014 1829   K 3.9 04/15/2014 1215   K 4.5 04/09/2014 1829   CL 103 04/09/2014 1829   CO2 26 04/15/2014 1215   CO2 27 04/09/2014 1829   BUN 7.5 04/15/2014 1215   BUN 17 04/09/2014 1829   CREATININE 0.7 04/15/2014 1215   CREATININE 0.78 04/09/2014 1829      Component Value Date/Time   CALCIUM 9.5 04/15/2014 1215   CALCIUM 9.4 04/09/2014 1829   ALKPHOS 65 04/15/2014 1215   ALKPHOS 83 02/17/2014 1527   AST 16 04/15/2014 1215   AST 14 02/17/2014 1527   ALT 14 04/15/2014 1215   ALT 19 02/17/2014 1527   BILITOT 0.27 04/15/2014 1215   BILITOT 1.2 02/17/2014 1527      STUDIES: Mr Breast Bilateral W Wo Contrast  04/02/2014   CLINICAL DATA:  Status post neoadjuvant  chemotherapy for right breast invasive ductal carcinoma. Recently removed left anterior chest  porta catheter (removed on 03/31/2014.  LABS:  None obtained today.  EXAM: BILATERAL BREAST MRI WITH AND WITHOUT CONTRAST  TECHNIQUE: Multiplanar, multisequence MR images of both breasts were obtained prior to and following the intravenous administration of 30ml of MultiHance.  THREE-DIMENSIONAL MR IMAGE RENDERING ON INDEPENDENT WORKSTATION:  Three-dimensional MR images were rendered by post-processing of the original MR data on an independent workstation. The three-dimensional MR images were interpreted, and findings are reported Rhonda the following complete MRI report for this study. Three dimensional images were evaluated at the independent DynaCad workstation  COMPARISON:  Previous examinations, including the previous breast MR dated 01/27/2014.  FINDINGS: Breast composition: c:  Heterogeneous fibroglandular tissue  Background parenchymal enhancement: Minimal  Right breast: The previously demonstrated masses Rhonda the lateral right breast are smaller or resolved. The largest residual mass measures 9 x 7 mm Rhonda maximum dimensions on image number 113 of the initial postcontrast sequence. This previously measured 14 x 11 mm.  The previously described contiguous non mass enhancement Rhonda the right breast is again demonstrated, currently measuring 8.7 x 6.3 x 5.7 cm Rhonda maximum dimensions. This previously measured 9.4 x 7 x 5 cm Rhonda maximum dimensions.  Left breast: Interval postoperative cavity Rhonda the upper inner left breast with surrounding enhancement following porta catheter removal on 03/31/2014. Otherwise, no mass or abnormal enhancement seen.  Lymph nodes: Interval decrease Rhonda size of multiple enlarged right axillary nodes. The largest node currently measures 17 x 8 mm Rhonda maximum dimensions on image number 148 of the first post-contrast sequence and previously measured 28 x 13 mm.  Ancillary findings:  None.  IMPRESSION: 1.  Interval partial response to chemotherapy Rhonda the right breast. 2. Interval partial response of right axillary adenopathy to chemotherapy. 3. No new areas of malignancy.  RECOMMENDATION: Treatment plan.  BI-RADS CATEGORY  6: Known biopsy-proven malignancy.   Electronically Signed   By: Enrique Sack M.D.   On: 04/02/2014 16:16   Ir Cv Line Injection  03/26/2014   CLINICAL DATA:  History of breast cancer.  Pain at Port-A-Cath site.  EXAM: PORT-A-CATH INJECTION WITH FLUOROSCOPY  Physician: Stephan Minister. Henn, MD  FLUOROSCOPY TIME:  30 seconds  MEDICATIONS: None  ANESTHESIA/SEDATION: Moderate sedation time: None  PROCEDURE: Patient was placed supine on the interventional table. Fluoroscopic images of the chest were obtained. The initial access needle placement was malpositioned. A new access needle was placed by the radiology nurse. Contrast was then injected through the Port-A-Cath. The port was flushed with heparinized saline. The access needle was removed.  FINDINGS: The patient has a left subclavian Port-A-Cath. The catheter tip is at the junction of the left innominate vein and SVC. The Port-A-Cath is horizontally oriented. The port does not aspirate. Contrast injection demonstrates filling defects and irregularity Rhonda the left innominate vein and upper SVC consistent with a fibrin sheath and thrombus. The lower SVC appears to be patent. No evidence for catheter discontinuity or extravasation.  COMPLICATIONS: None  IMPRESSION: Catheter tip at the junction of the left innominate vein and SVC and probably up against the vessel wall. Rhonda addition, there are multiple filling defects Rhonda the left innominate vein and upper SVC consistent with nonocclusive thrombus and a fibrin sheath.  Recommend removal of this Port-A-Cath or revision.   Electronically Signed   By: Markus Daft M.D.   On: 03/26/2014 14:12  Transthoracic Echocardiography  Patient: Teasha, [place] MR #: [id number] Study Date: 04/10/2014 Gender: F Age:  43 Height: 177.8 cm Weight:  89.8 kg BSA: 2.12 m^2 Pt. Status: Room:  ATTENDING Pierre Bali, MD SONOGRAPHER Tresa Res, RDCS ORDERING Bensimhon, Glynis Smiles Bensimhon, Jonette Pesa [phone] PERFORMING [facility], Outpatient  cc:  ------------------------------------------------------------------- LV EF: 55% - 60%   ASSESSMENT: 43 y.o. [place] woman originally from [place]  (1)   status post right breast and right axillary lymph node biopsy 01/20/2014, both positive for a clinically T3 N1-2, clinical stage III a invasive ductal carcinoma, grade 2, estrogen and progesterone receptor negative, HER-2 positive, with an Rhonda [name] of 85%  (2)  treated neoadjuvantly with 4 dose dense cycles of doxorubicin/cyclophosphamide completed 03/25/2014, to be followed by weekly paclitaxel/ carboplatin x12, given along with trastuzumab/ pertuzumab every 3 weeks prior to definitive surgery.   (3)  Patient will likely need a right modified radical mastectomy, followed by postmastectomy radiation.   (4)  trastuzumab will be continued every 3 weeks for total of one year; most recent echocardiogram, 04/10/2014, showed a well preserved ejection fraction  PLAN: The MRI obtained after the first part of her chemotherapy shows a very favorable response. Her new port is Rhonda a good location. Her repeat echocardiogram shows a normal ejection fraction. Lyan has recovered from her earlier chemotherapy and her mood is improved. She is ready to start the second part of her neoadjuvant treatment, which will consist of carboplatin and paclitaxel given weekly for 12 weeks. We are also doing anti-HER-2 treatment with both trastuzumab and pertuzumab and those 2 drugs will be repeated every 3 weeks.  Tomorrow she will receive all 4 drugs. She has a good understanding of the possible toxicities, side effects and complications of these agents. She is going to see me again next week, when she will only  receive the carboplatin and paclitaxel. At that point we may simplify her anti-emetics. Once she completes this phase of her treatment she will have a repeat breast MRI. We are hoping she will show a complete radiologic response at that time.  Burkley has a good understanding of the overall plan. She agrees with it. She knows the goal of treatment Rhonda her case is cure. She will call with any problems that may develop before the next visit here.

## 2014-04-16 ENCOUNTER — Ambulatory Visit (HOSPITAL_BASED_OUTPATIENT_CLINIC_OR_DEPARTMENT_OTHER): Payer: Medicaid Other

## 2014-04-16 VITALS — BP 137/87 | HR 65 | Temp 99.0°F | Resp 18

## 2014-04-16 DIAGNOSIS — Z5112 Encounter for antineoplastic immunotherapy: Secondary | ICD-10-CM

## 2014-04-16 DIAGNOSIS — C50411 Malignant neoplasm of upper-outer quadrant of right female breast: Secondary | ICD-10-CM

## 2014-04-16 DIAGNOSIS — Z5111 Encounter for antineoplastic chemotherapy: Secondary | ICD-10-CM

## 2014-04-16 DIAGNOSIS — C50919 Malignant neoplasm of unspecified site of unspecified female breast: Secondary | ICD-10-CM

## 2014-04-16 DIAGNOSIS — C773 Secondary and unspecified malignant neoplasm of axilla and upper limb lymph nodes: Secondary | ICD-10-CM

## 2014-04-16 DIAGNOSIS — C50419 Malignant neoplasm of upper-outer quadrant of unspecified female breast: Secondary | ICD-10-CM

## 2014-04-16 MED ORDER — SODIUM CHLORIDE 0.9 % IJ SOLN
10.0000 mL | INTRAMUSCULAR | Status: DC | PRN
  Filled 2014-04-16: qty 10

## 2014-04-16 MED ORDER — DEXAMETHASONE SODIUM PHOSPHATE 20 MG/5ML IJ SOLN
INTRAMUSCULAR | Status: AC
  Filled 2014-04-16: qty 5

## 2014-04-16 MED ORDER — ACETAMINOPHEN 325 MG PO TABS
ORAL_TABLET | ORAL | Status: AC
  Filled 2014-04-16: qty 2

## 2014-04-16 MED ORDER — TRASTUZUMAB CHEMO INJECTION 440 MG
8.0000 mg/kg | Freq: Once | INTRAVENOUS | Status: AC
  Administered 2014-04-16: 693 mg via INTRAVENOUS
  Filled 2014-04-16: qty 33

## 2014-04-16 MED ORDER — PACLITAXEL CHEMO INJECTION 300 MG/50ML
80.0000 mg/m2 | Freq: Once | INTRAVENOUS | Status: AC
  Administered 2014-04-16: 162 mg via INTRAVENOUS
  Filled 2014-04-16: qty 27

## 2014-04-16 MED ORDER — DIPHENHYDRAMINE HCL 50 MG/ML IJ SOLN
INTRAMUSCULAR | Status: AC
  Filled 2014-04-16: qty 1

## 2014-04-16 MED ORDER — FAMOTIDINE IN NACL 20-0.9 MG/50ML-% IV SOLN
INTRAVENOUS | Status: AC
  Filled 2014-04-16: qty 50

## 2014-04-16 MED ORDER — ACETAMINOPHEN 325 MG PO TABS
650.0000 mg | ORAL_TABLET | Freq: Once | ORAL | Status: AC
  Administered 2014-04-16: 650 mg via ORAL

## 2014-04-16 MED ORDER — ONDANSETRON 16 MG/50ML IVPB (CHCC)
16.0000 mg | Freq: Once | INTRAVENOUS | Status: AC
  Administered 2014-04-16: 16 mg via INTRAVENOUS

## 2014-04-16 MED ORDER — ONDANSETRON 16 MG/50ML IVPB (CHCC)
INTRAVENOUS | Status: AC
  Filled 2014-04-16: qty 16

## 2014-04-16 MED ORDER — SODIUM CHLORIDE 0.9 % IV SOLN
840.0000 mg | Freq: Once | INTRAVENOUS | Status: AC
  Administered 2014-04-16: 840 mg via INTRAVENOUS
  Filled 2014-04-16: qty 28

## 2014-04-16 MED ORDER — SODIUM CHLORIDE 0.9 % IV SOLN
300.0000 mg | Freq: Once | INTRAVENOUS | Status: AC
  Administered 2014-04-16: 300 mg via INTRAVENOUS
  Filled 2014-04-16: qty 30

## 2014-04-16 MED ORDER — DIPHENHYDRAMINE HCL 50 MG/ML IJ SOLN
25.0000 mg | Freq: Once | INTRAMUSCULAR | Status: AC
  Administered 2014-04-16: 25 mg via INTRAVENOUS

## 2014-04-16 MED ORDER — HEPARIN SOD (PORK) LOCK FLUSH 100 UNIT/ML IV SOLN
500.0000 [IU] | Freq: Once | INTRAVENOUS | Status: DC | PRN
  Filled 2014-04-16: qty 5

## 2014-04-16 MED ORDER — SODIUM CHLORIDE 0.9 % IV SOLN
Freq: Once | INTRAVENOUS | Status: AC
  Administered 2014-04-16: 09:00:00 via INTRAVENOUS

## 2014-04-16 MED ORDER — DEXAMETHASONE SODIUM PHOSPHATE 20 MG/5ML IJ SOLN
20.0000 mg | Freq: Once | INTRAMUSCULAR | Status: AC
  Administered 2014-04-16: 20 mg via INTRAVENOUS

## 2014-04-16 MED ORDER — FAMOTIDINE IN NACL 20-0.9 MG/50ML-% IV SOLN
20.0000 mg | Freq: Once | INTRAVENOUS | Status: AC
  Administered 2014-04-16: 20 mg via INTRAVENOUS

## 2014-04-16 NOTE — Patient Instructions (Addendum)
[facility] Discharge Instructions for Patients Receiving Chemotherapy  Today you received the following chemotherapy agents Paclitaxel/Carboplatin/Herceptin/Perjeta.   To help prevent nausea and vomiting after your treatment, we encourage you to take your nausea medication as directed.    If you develop nausea and vomiting that is not controlled by your nausea medication, call the clinic.   BELOW ARE SYMPTOMS THAT SHOULD BE REPORTED IMMEDIATELY:  *FEVER GREATER THAN 100.5 F  *CHILLS WITH OR WITHOUT FEVER  NAUSEA AND VOMITING THAT IS NOT CONTROLLED WITH YOUR NAUSEA MEDICATION  *UNUSUAL SHORTNESS OF BREATH  *UNUSUAL BRUISING OR BLEEDING  TENDERNESS IN MOUTH AND THROAT WITH OR WITHOUT PRESENCE OF ULCERS  *URINARY PROBLEMS  *BOWEL PROBLEMS  UNUSUAL RASH Items with * indicate a potential emergency and should be followed up as soon as possible.  Feel free to call the clinic you have any questions or concerns. The clinic phone number is [phone].

## 2014-04-17 ENCOUNTER — Telehealth: Payer: Self-pay | Admitting: *Deleted

## 2014-04-17 NOTE — Telephone Encounter (Signed)
Called Rhonda [name] for chemotherapy F/U.  Patient is doing well.  Denies n/v.  Denies any new side effects or symptoms.  Reports while here yesterday she felt cold.  Temperature did go up to 99.0.  Today denies fever or chills.  Bowel and bladder is functioning well.  Eating and drinking well and I instructed to drink 64 oz minimum daily or at least the day before, of and after treatment.  Had a "good experience it was just long and felt chilly.  Breast feel tender and back pain both comes and goes."  Denies questions at this time and encouraged to call if needed.  Reviewed how to call after hours in the case of an emergency.

## 2014-04-17 NOTE — Telephone Encounter (Signed)
Message copied by Cherylynn Ridges on Thu Apr 17, 2014 12:05 PM ------      Message from: Earlie Counts      Created: Wed Apr 16, 2014  4:22 PM       1st Taxol/Herceptin/Perjeta/Carbo            Dr. Jana Hakim ------

## 2014-04-22 ENCOUNTER — Other Ambulatory Visit: Payer: Medicaid Other

## 2014-04-22 ENCOUNTER — Ambulatory Visit (HOSPITAL_BASED_OUTPATIENT_CLINIC_OR_DEPARTMENT_OTHER): Payer: Medicaid Other | Admitting: Oncology

## 2014-04-22 ENCOUNTER — Other Ambulatory Visit (HOSPITAL_BASED_OUTPATIENT_CLINIC_OR_DEPARTMENT_OTHER): Payer: Medicaid Other

## 2014-04-22 ENCOUNTER — Ambulatory Visit (HOSPITAL_BASED_OUTPATIENT_CLINIC_OR_DEPARTMENT_OTHER): Payer: Medicaid Other

## 2014-04-22 VITALS — BP 119/46 | HR 66 | Temp 98.7°F | Resp 18 | Ht 70.0 in | Wt 194.3 lb

## 2014-04-22 DIAGNOSIS — Z170 Estrogen receptor positive status [ER+]: Secondary | ICD-10-CM

## 2014-04-22 DIAGNOSIS — R630 Anorexia: Secondary | ICD-10-CM

## 2014-04-22 DIAGNOSIS — R42 Dizziness and giddiness: Secondary | ICD-10-CM

## 2014-04-22 DIAGNOSIS — C50419 Malignant neoplasm of upper-outer quadrant of unspecified female breast: Secondary | ICD-10-CM

## 2014-04-22 DIAGNOSIS — C50919 Malignant neoplasm of unspecified site of unspecified female breast: Secondary | ICD-10-CM

## 2014-04-22 DIAGNOSIS — C50411 Malignant neoplasm of upper-outer quadrant of right female breast: Secondary | ICD-10-CM

## 2014-04-22 DIAGNOSIS — C773 Secondary and unspecified malignant neoplasm of axilla and upper limb lymph nodes: Secondary | ICD-10-CM

## 2014-04-22 DIAGNOSIS — M949 Disorder of cartilage, unspecified: Secondary | ICD-10-CM

## 2014-04-22 DIAGNOSIS — M899 Disorder of bone, unspecified: Secondary | ICD-10-CM

## 2014-04-22 DIAGNOSIS — D649 Anemia, unspecified: Secondary | ICD-10-CM

## 2014-04-22 DIAGNOSIS — R51 Headache: Secondary | ICD-10-CM

## 2014-04-22 DIAGNOSIS — Z5111 Encounter for antineoplastic chemotherapy: Secondary | ICD-10-CM

## 2014-04-22 LAB — COMPREHENSIVE METABOLIC PANEL (CC13)
ALBUMIN: 3.3 g/dL — AB (ref 3.5–5.0)
ALT: 27 U/L (ref 0–55)
ANION GAP: 5 meq/L (ref 3–11)
AST: 25 U/L (ref 5–34)
Alkaline Phosphatase: 52 U/L (ref 40–150)
BUN: 13.8 mg/dL (ref 7.0–26.0)
CHLORIDE: 108 meq/L (ref 98–109)
CO2: 27 meq/L (ref 22–29)
CREATININE: 0.7 mg/dL (ref 0.6–1.1)
Calcium: 9.2 mg/dL (ref 8.4–10.4)
Glucose: 94 mg/dl (ref 70–140)
POTASSIUM: 4.2 meq/L (ref 3.5–5.1)
Sodium: 140 mEq/L (ref 136–145)
Total Bilirubin: 0.71 mg/dL (ref 0.20–1.20)
Total Protein: 7 g/dL (ref 6.4–8.3)

## 2014-04-22 LAB — CBC WITH DIFFERENTIAL/PLATELET
BASO%: 0.9 % (ref 0.0–2.0)
Basophils Absolute: 0 10*3/uL (ref 0.0–0.1)
EOS%: 0.2 % (ref 0.0–7.0)
Eosinophils Absolute: 0 10*3/uL (ref 0.0–0.5)
HCT: 29.1 % — ABNORMAL LOW (ref 34.8–46.6)
HGB: 9 g/dL — ABNORMAL LOW (ref 11.6–15.9)
LYMPH#: 0.9 10*3/uL (ref 0.9–3.3)
LYMPH%: 28.5 % (ref 14.0–49.7)
MCH: 22.5 pg — ABNORMAL LOW (ref 25.1–34.0)
MCHC: 31 g/dL — AB (ref 31.5–36.0)
MCV: 72.6 fL — ABNORMAL LOW (ref 79.5–101.0)
MONO#: 0.3 10*3/uL (ref 0.1–0.9)
MONO%: 9.1 % (ref 0.0–14.0)
NEUT#: 1.9 10*3/uL (ref 1.5–6.5)
NEUT%: 61.3 % (ref 38.4–76.8)
Platelets: 306 10*3/uL (ref 145–400)
RBC: 4.01 10*6/uL (ref 3.70–5.45)
RDW: 19 % — ABNORMAL HIGH (ref 11.2–14.5)
WBC: 3.1 10*3/uL — AB (ref 3.9–10.3)

## 2014-04-22 MED ORDER — SODIUM CHLORIDE 0.9 % IJ SOLN
10.0000 mL | INTRAMUSCULAR | Status: DC | PRN
  Administered 2014-04-22: 10 mL
  Filled 2014-04-22: qty 10

## 2014-04-22 MED ORDER — ONDANSETRON 16 MG/50ML IVPB (CHCC)
INTRAVENOUS | Status: AC
  Filled 2014-04-22: qty 16

## 2014-04-22 MED ORDER — HEPARIN SOD (PORK) LOCK FLUSH 100 UNIT/ML IV SOLN
500.0000 [IU] | Freq: Once | INTRAVENOUS | Status: AC | PRN
  Administered 2014-04-22: 500 [IU]
  Filled 2014-04-22: qty 5

## 2014-04-22 MED ORDER — DIPHENHYDRAMINE HCL 50 MG/ML IJ SOLN
25.0000 mg | Freq: Once | INTRAMUSCULAR | Status: AC
  Administered 2014-04-22: 25 mg via INTRAVENOUS

## 2014-04-22 MED ORDER — FAMOTIDINE IN NACL 20-0.9 MG/50ML-% IV SOLN
20.0000 mg | Freq: Once | INTRAVENOUS | Status: AC
  Administered 2014-04-22: 20 mg via INTRAVENOUS

## 2014-04-22 MED ORDER — DEXAMETHASONE SODIUM PHOSPHATE 20 MG/5ML IJ SOLN
20.0000 mg | Freq: Once | INTRAMUSCULAR | Status: AC
  Administered 2014-04-22: 20 mg via INTRAVENOUS

## 2014-04-22 MED ORDER — FAMOTIDINE IN NACL 20-0.9 MG/50ML-% IV SOLN
INTRAVENOUS | Status: AC
  Filled 2014-04-22: qty 50

## 2014-04-22 MED ORDER — SODIUM CHLORIDE 0.9 % IV SOLN
Freq: Once | INTRAVENOUS | Status: AC
  Administered 2014-04-22: 14:00:00 via INTRAVENOUS

## 2014-04-22 MED ORDER — DIPHENHYDRAMINE HCL 25 MG PO CAPS
ORAL_CAPSULE | ORAL | Status: AC
  Filled 2014-04-22: qty 1

## 2014-04-22 MED ORDER — ONDANSETRON 16 MG/50ML IVPB (CHCC)
16.0000 mg | Freq: Once | INTRAVENOUS | Status: AC
  Administered 2014-04-22: 16 mg via INTRAVENOUS

## 2014-04-22 MED ORDER — DEXAMETHASONE SODIUM PHOSPHATE 20 MG/5ML IJ SOLN
INTRAMUSCULAR | Status: AC
  Filled 2014-04-22: qty 5

## 2014-04-22 MED ORDER — DIPHENHYDRAMINE HCL 50 MG/ML IJ SOLN
INTRAMUSCULAR | Status: AC
Start: 2014-04-22 — End: 2014-04-22
  Filled 2014-04-22: qty 1

## 2014-04-22 MED ORDER — PACLITAXEL CHEMO INJECTION 300 MG/50ML
80.0000 mg/m2 | Freq: Once | INTRAVENOUS | Status: AC
  Administered 2014-04-22: 162 mg via INTRAVENOUS
  Filled 2014-04-22: qty 27

## 2014-04-22 NOTE — Patient Instructions (Signed)
[facility] Discharge Instructions for Patients Receiving Chemotherapy  Today you received the following chemotherapy agents Paclitaxel.  To help prevent nausea and vomiting after your treatment, we encourage you to take your nausea medication as directed.    If you develop nausea and vomiting that is not controlled by your nausea medication, call the clinic.   BELOW ARE SYMPTOMS THAT SHOULD BE REPORTED IMMEDIATELY:  *FEVER GREATER THAN 100.5 F  *CHILLS WITH OR WITHOUT FEVER  NAUSEA AND VOMITING THAT IS NOT CONTROLLED WITH YOUR NAUSEA MEDICATION  *UNUSUAL SHORTNESS OF BREATH  *UNUSUAL BRUISING OR BLEEDING  TENDERNESS IN MOUTH AND THROAT WITH OR WITHOUT PRESENCE OF ULCERS  *URINARY PROBLEMS  *BOWEL PROBLEMS  UNUSUAL RASH Items with * indicate a potential emergency and should be followed up as soon as possible.  Feel free to call the clinic you have any questions or concerns. The clinic phone number is [phone].    

## 2014-04-22 NOTE — Progress Notes (Signed)
[place]  [phone] Fax:([phone]) [phone]     ID: Rhonda [name] OB: [birth year]  MR#: [id number]  DJT#:[id number]  PCP: Tereasa Coop, PA-C GYN:  Mora Bellman, MD SU: Excell Seltzer, MD OTHER MD: Arloa Koh, MD  CHIEF COMPLAINT: Right breast cancer CURRENT TREATMENT: neoadjuvant chemotherapy  BREAST CANCER HISTORY: From the original intake note:  Jamae was visiting relatives in [place] about 4 months ago when she noted pain in her right breast. In 2003 she had a right breast infection which felt pretty much the same. She was treated with antibiotics back then (while living in [place]). Accordingly, this time she minimized the symptoms. It felt like milk was coming down she says. She also had breast pain while having her period. Then in February 2015, she actually found 2 lumps in her right breast breast which she initially ignored. Eventually as the symptoms seemed to progress she saw a Dr. In [place] and he told her she likely had breast cancer. She waited until [place] to seek definitive care.  On 12/17/2013 she had bilateral diagnostic mammography in [place] [facility]). In the upper outer right breast there were pleomorphic calcifications measuring approximately 9 cm in diameter. There were 4 distinct microlobulated masses in the right breast, 3 of them in the upper outer quadrant one in the lower outer quadrant. Each measures approximately 1.2 cm. Ultrasound showed a complex cystic mass in the superior central right breast, a 4 mm hypoechoic lesion, 3 suspicious solid masses elsewhere in the right breast, all of them hypoechoic and oval are round with increased vascularity. The largest of these masses measure 1.7 cm. Findings in the left breast were not suspicious or specific. The patient was referred to the breast Center where on 01/20/2014 she underwent biopsy of 2 of the right breast masses, and a right axillary lymph node.  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  On 01/28/2014 the patient underwent bilateral breast MRI. This showed, in the right breast, multiple irregular enhancing masses in the setting of clumped non-masslike enhancement the area in question measured up to 9.4 cm. There was diffuse asymmetric skin thickening and edema involving the right breast, but no obvious nipple or pectoralis involvement. There were several enlarged and morphologically abnormal right axillary lymph node as well as inter-pectoral and subpectoral adenopathy there the largest right axillary lymph node measured 2.7 cm. There was no internal mammary lymphadenopathy noted. The left breast was unremarkable.  Her subsequent history is as detailed below.  INTERVAL HISTORY: Derenda returns today for followup of her locally advanced breast cancer. Today is day 1 cycle 2 of her weekly paclitaxel/carboplatin treatments. I had hoped her first dose would be easier on her, but it really wasn't. She had just as much nausea as before and came to "closer to vomiting" than before. She also was significantly fatigued. It doesn't help with her family situation is still "up in the air", with her husband and 2 of her children still in some mildly.    REVIEW OF SYSTEMS: She has lost her appetite and nothing taste good, not even water. She is trying to drink as much water as she can however. She has developed some pain in the [place] middle of the sternum, which is very likely due to reflux. She admits that omeprazole is helpful. She's  had some fleeting pains in both breasts which are gone before she can do anything about them. She has had some dizziness and some headaches for the last couple of days. She has joint pains. A detailed review of systems was otherwise stable  PAST MEDICAL HISTORY: Past  Medical History  Diagnosis Date  . Hypertension   . Blood transfusion without reported diagnosis     3 previous   . Anemia   . Ulcer     hx of stomach ulcer a long time ago  . GERD (gastroesophageal reflux disease)     a long time ago  . Headache(784.0)   . Cancer 10/2013    right breast cancer  . Anxiety     PAST SURGICAL HISTORY: Past Surgical History  Procedure Laterality Date  . Cesarean section      2 previous  . Back surgery  2000  . Appendectomy      a long time ago in [place]  . Portacath placement N/A 02/06/2014    Procedure: INSERTION PORT-A-CATH;  Surgeon: Edward Jolly, MD;  Location: [facility];  Service: General;  Laterality: N/A;  . Port-a-cath removal Left 03/31/2014    Procedure: REMOVAL PORT-A-CATH;  Surgeon: Edward Jolly, MD;  Location: [facility];  Service: General;  Laterality: Left;  . Portacath placement Right 04/14/2014    Procedure: PLACEMENT OF PORT-A-CATH;  Surgeon: Edward Jolly, MD;  Location: [facility];  Service: General;  Laterality: Right;    FAMILY HISTORY Family History  Problem Relation Age of Onset  . Hypertension Mother   . Hypertension Father    the patient's parents are living but she is not sure of their age. She had 4 brothers and 4 sisters. 2 other brothers died in the [place] and one brother is currently in [place] but they do not know where are even if he is still alive. She has one brother in this area. There is no history of breast or ovarian cancer in the family to her knowledge.  GYNECOLOGIC HISTORY: (Reviewed 03/18/2014) Menarche somewhere between the ages of 53 and 12. First live birth age 13. The patient is GX P3. Her periods are now irregular. She took birth control pills remotely for approximately one year with no complaints.  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 Her husband Jacquenette Shone Lol Dalene Carrow is a former Emergency planning/management officer to the [place] from [place] and  is a Optometrist. Their children are Duopl (11),Nyamal (8) and Nyewech (4).  The youngest child is currently living with Apolonio Schneiders. The 2 older children are currently in [place]. The patient attends a [employer] church locally    ADVANCED DIRECTIVES: Not in place   HEALTH MAINTENANCE: (Updated 03/18/2014) History  Substance Use Topics  . Smoking status: Never Smoker   . Smokeless tobacco: Never Used  . Alcohol Use: No     Colonoscopy: Never  PAP: April 2015/Dr. Constant  Bone density: Never  Lipid panel: Not on file  No Known Allergies  Current Outpatient Prescriptions  Medication Sig Dispense Refill  . acetaminophen (TYLENOL) 325 MG tablet Take 650 mg by mouth every 6 (six) hours as needed for mild pain or headache.       . Alum & Mag Hydroxide-Simeth (MAGIC MOUTHWASH W/LIDOCAINE) SOLN Take 5 mLs by mouth 4 (four) times daily as needed for mouth pain.  240 mL  1  . baclofen (LIORESAL) 10 MG tablet Take 10 mg by  mouth every morning.       . cholecalciferol (VITAMIN D) 1000 UNITS tablet Take 1,000 Units by mouth daily.      Marland Kitchen dexamethasone (DECADRON) 4 MG tablet Take 8 mg by mouth 2 (two) times daily. Take for three days after chemo      . diazepam (VALIUM) 5 MG tablet Take 1 tablet (5 mg total) by mouth 2 (two) times daily.  10 tablet  0  . hydrochlorothiazide (MICROZIDE) 12.5 MG capsule Take 12.5 mg by mouth daily as needed (headache).       Marland Kitchen HYDROcodone-acetaminophen (NORCO/VICODIN) 5-325 MG per tablet Take 1-2 tablets by mouth every 4 (four) hours as needed (pain.).      Marland Kitchen lidocaine-prilocaine (EMLA) cream Apply to port 1 -2 hr before each procedure/port access as directed  30 g  6  . loratadine (CLARITIN) 10 MG tablet Take 10 mg by mouth daily.      . meclizine (ANTIVERT) 25 MG tablet Take 1 tablet (25 mg total) by mouth 3 (three) times daily as needed.  30 tablet  0  . omeprazole (PRILOSEC) 40 MG capsule Take 1 capsule (40 mg total) by mouth daily.  30 capsule  6  .  ondansetron (ZOFRAN) 8 MG tablet Take 8 mg by mouth 2 (two) times daily as needed for nausea or vomiting (start the third day after chemo).      . prochlorperazine (COMPAZINE) 10 MG tablet Take 10 mg by mouth every 6 (six) hours as needed for nausea or vomiting (Take for three days after chemo).       No current facility-administered medications for this visit.    OBJECTIVE:    Filed Vitals:   04/22/14 1247  BP: 119/46  Pulse: 66  Temp: 98.7 F (37.1 C)  Resp: 18   Filed Weights   04/22/14 1247  Weight: 194 lb 4.8 oz (88.134 kg)      middle-aged African woman who appears stated age  Sclerae unicteric, EOMs intact Oropharynx clear and slightly dry No cervical or supraclavicular adenopathy Lungs no rales or rhonchi Heart regular rate and rhythm Abd soft, nontender, positive bowel sounds MSK no focal spinal tenderness, no upper extremity lymphedema Neuro: nonfocal, well oriented, depressed affect Breasts: Deferred   LAB RESULTS:   Lab Results  Component Value Date   WBC 3.1* 04/22/2014   NEUTROABS 1.9 04/22/2014   HGB 9.0* 04/22/2014   HCT 29.1* 04/22/2014   MCV 72.6* 04/22/2014   PLT 306 04/22/2014      Chemistry      Component Value Date/Time   NA 140 04/22/2014 1238   NA 141 04/09/2014 1829   K 4.2 04/22/2014 1238   K 4.5 04/09/2014 1829   CL 103 04/09/2014 1829   CO2 27 04/22/2014 1238   CO2 27 04/09/2014 1829   BUN 13.8 04/22/2014 1238   BUN 17 04/09/2014 1829   CREATININE 0.7 04/22/2014 1238   CREATININE 0.78 04/09/2014 1829      Component Value Date/Time   CALCIUM 9.2 04/22/2014 1238   CALCIUM 9.4 04/09/2014 1829   ALKPHOS 52 04/22/2014 1238   ALKPHOS 83 02/17/2014 1527   AST 25 04/22/2014 1238   AST 14 02/17/2014 1527   ALT 27 04/22/2014 1238   ALT 19 02/17/2014 1527   BILITOT 0.71 04/22/2014 1238   BILITOT 1.2 02/17/2014 1527      STUDIES: Mr Breast Bilateral W Wo Contrast  04/02/2014   CLINICAL DATA:  Status post neoadjuvant chemotherapy for right  breast  invasive ductal carcinoma. Recently removed left anterior chest porta catheter (removed on 03/31/2014.  LABS:  None obtained today.  EXAM: BILATERAL BREAST MRI WITH AND WITHOUT CONTRAST  TECHNIQUE: Multiplanar, multisequence MR images of both breasts were obtained prior to and following the intravenous administration of 65m of MultiHance.  THREE-DIMENSIONAL MR IMAGE RENDERING ON INDEPENDENT WORKSTATION:  Three-dimensional MR images were rendered by post-processing of the original MR data on an independent workstation. The three-dimensional MR images were interpreted, and findings are reported in the following complete MRI report for this study. Three dimensional images were evaluated at the independent DynaCad workstation  COMPARISON:  Previous examinations, including the previous breast MR dated 01/27/2014.  FINDINGS: Breast composition: c:  Heterogeneous fibroglandular tissue  Background parenchymal enhancement: Minimal  Right breast: The previously demonstrated masses in the lateral right breast are smaller or resolved. The largest residual mass measures 9 x 7 mm in maximum dimensions on image number 113 of the initial postcontrast sequence. This previously measured 14 x 11 mm.  The previously described contiguous non mass enhancement in the right breast is again demonstrated, currently measuring 8.7 x 6.3 x 5.7 cm in maximum dimensions. This previously measured 9.4 x 7 x 5 cm in maximum dimensions.  Left breast: Interval postoperative cavity in the upper inner left breast with surrounding enhancement following porta catheter removal on 03/31/2014. Otherwise, no mass or abnormal enhancement seen.  Lymph nodes: Interval decrease in size of multiple enlarged right axillary nodes. The largest node currently measures 17 x 8 mm in maximum dimensions on image number 148 of the first post-contrast sequence and previously measured 28 x 13 mm.  Ancillary findings:  None.  IMPRESSION: 1. Interval partial response to  chemotherapy in the right breast. 2. Interval partial response of right axillary adenopathy to chemotherapy. 3. No new areas of malignancy.  RECOMMENDATION: Treatment plan.  BI-RADS CATEGORY  6: Known biopsy-proven malignancy.   Electronically Signed   By: SEnrique SackM.D.   On: 04/02/2014 16:16    CLINICAL DATA: Intermittent vertigo with room spinning since waking  this morning.  EXAM:  MRI HEAD WITHOUT AND WITH CONTRAST  TECHNIQUE:  Multiplanar, multiecho pulse sequences of the brain and surrounding  structures were obtained without and with intravenous contrast.  CONTRAST: 131mMULTIHANCE GADOBENATE DIMEGLUMINE 529 MG/ML IV SOLN  COMPARISON: CT head without contrast 04/09/2014  FINDINGS:  Extensive subcortical T2 hyperintensities are present bilaterally.  More minimal periventricular changes are seen. The  diffusion-weighted images demonstrate no evidence for acute or  subacute infarction. There is loss of normal T1 marrow signal.  Midline structures are otherwise within normal limits.  Flow is present in the major intracranial arteries. The globes and  orbits are intact. The postcontrast images demonstrates no  pathologic enhancement.  IMPRESSION:  1. Extensive subcortical white matter changes bilaterally. More  minimal periventricular change. The finding is nonspecific but can  be seen in the setting of chronic microvascular ischemia, a  demyelinating process such as multiple sclerosis, vasculitis,  complicated migraine headaches, or as the sequelae of a prior  infectious or inflammatory process. This could be related to  chemotherapy or radiation treatment as well.  2. No acute intracranial abnormality.  3. Loss of normal T1 marrow signal in the upper cervical spine and  calvarium compatible with the patient's known anemia.  Electronically Signed  By: ChLawrence Santiago.D.  On: 04/09/2014 20:49   ASSESSMENT: [age][place]. [place]  status  post right breast and right axillary lymph node biopsy 01/20/2014, both positive for a clinically T3 N1-2, clinical stage III a invasive ductal carcinoma, grade 2, estrogen and progesterone receptor negative, HER-2 positive, with an MIB-1 of 85%  (2)  treated neoadjuvantly with 4 dose dense cycles of doxorubicin/cyclophosphamide completed 03/25/2014, to be followed by weekly paclitaxel/ carboplatin x12, given along with trastuzumab/ pertuzumab every 3 weeks prior to definitive surgery.   (a) carboplatin was held with the second cycle because of poor tolerance  (3)  Patient will likely need a right modified radical mastectomy, followed by postmastectomy radiation.   (4)  trastuzumab will be continued every 3 weeks for total of one year; most recent echocardiogram, 04/10/2014, showed a well preserved ejection fraction  PLAN: Nekesha is close to "giving up" as far as chemotherapy is concerned. Though I would prefer to continue with both carboplatin and paclitaxel, I think if we try to push to heart we are not going to be able to complete her treatment. It is more reasonable to hold of the carboplatin and see if she tolerates the Taxol better. If she does then possibly we could give some carboplatin doses at some point in the next 11 weeks.  She has not been taking antinausea medicine optimally. I suggested she take dexamethasone 2 tablets twice a day and also ondansetron twice a day. She will use it prochlorperazine only as needed. She will use her lorazepam at bedtime.  I am hoping the these changes she will tolerate treatment much better. She will see me again next week. Otherwise another option would be to treat her with 2 weeks, but that might compromise the effectiveness of the therapy.  Louann has a good understanding of the overall plan. She agrees with it. She knows the goal of treatment in her case is cure. She will call with any problems that may develop before the next visit here.

## 2014-04-24 ENCOUNTER — Telehealth: Payer: Self-pay | Admitting: Oncology

## 2014-04-24 NOTE — Telephone Encounter (Signed)
per pof to sch pt appt-call & gave pt appts-pt understood

## 2014-04-25 ENCOUNTER — Telehealth: Payer: Self-pay | Admitting: *Deleted

## 2014-04-25 NOTE — Telephone Encounter (Signed)
This RN received a voice message from patient needing Dr.Magrinat to give his OK so she can have some dental work done. Patient to call back on Monday, 04/28/14, and give [place] the name of the dentist. Patient verbalized understanding.

## 2014-04-25 NOTE — Telephone Encounter (Signed)
Per [facility] staff message scheduled appts. Advised scheduler 

## 2014-04-29 ENCOUNTER — Other Ambulatory Visit (HOSPITAL_BASED_OUTPATIENT_CLINIC_OR_DEPARTMENT_OTHER): Payer: Medicaid Other

## 2014-04-29 ENCOUNTER — Ambulatory Visit (HOSPITAL_BASED_OUTPATIENT_CLINIC_OR_DEPARTMENT_OTHER): Payer: Medicaid Other | Admitting: Oncology

## 2014-04-29 ENCOUNTER — Telehealth: Payer: Self-pay | Admitting: Oncology

## 2014-04-29 ENCOUNTER — Telehealth: Payer: Self-pay | Admitting: *Deleted

## 2014-04-29 ENCOUNTER — Ambulatory Visit: Payer: Medicaid Other

## 2014-04-29 VITALS — BP 137/54 | HR 74 | Temp 98.4°F | Resp 18 | Ht 70.0 in | Wt 195.9 lb

## 2014-04-29 DIAGNOSIS — C50411 Malignant neoplasm of upper-outer quadrant of right female breast: Secondary | ICD-10-CM

## 2014-04-29 DIAGNOSIS — C50919 Malignant neoplasm of unspecified site of unspecified female breast: Secondary | ICD-10-CM

## 2014-04-29 DIAGNOSIS — F3289 Other specified depressive episodes: Secondary | ICD-10-CM

## 2014-04-29 DIAGNOSIS — F411 Generalized anxiety disorder: Secondary | ICD-10-CM

## 2014-04-29 DIAGNOSIS — C50419 Malignant neoplasm of upper-outer quadrant of unspecified female breast: Secondary | ICD-10-CM

## 2014-04-29 DIAGNOSIS — F329 Major depressive disorder, single episode, unspecified: Secondary | ICD-10-CM

## 2014-04-29 DIAGNOSIS — D649 Anemia, unspecified: Secondary | ICD-10-CM

## 2014-04-29 DIAGNOSIS — T451X5A Adverse effect of antineoplastic and immunosuppressive drugs, initial encounter: Principal | ICD-10-CM

## 2014-04-29 DIAGNOSIS — D6481 Anemia due to antineoplastic chemotherapy: Secondary | ICD-10-CM

## 2014-04-29 LAB — CBC WITH DIFFERENTIAL/PLATELET
BASO%: 0.4 % (ref 0.0–2.0)
Basophils Absolute: 0 10*3/uL (ref 0.0–0.1)
EOS%: 0.7 % (ref 0.0–7.0)
Eosinophils Absolute: 0 10*3/uL (ref 0.0–0.5)
HCT: 27.7 % — ABNORMAL LOW (ref 34.8–46.6)
HGB: 8.6 g/dL — ABNORMAL LOW (ref 11.6–15.9)
LYMPH#: 0.9 10*3/uL (ref 0.9–3.3)
LYMPH%: 23.9 % (ref 14.0–49.7)
MCH: 23 pg — ABNORMAL LOW (ref 25.1–34.0)
MCHC: 31.2 g/dL — AB (ref 31.5–36.0)
MCV: 73.7 fL — ABNORMAL LOW (ref 79.5–101.0)
MONO#: 0.4 10*3/uL (ref 0.1–0.9)
MONO%: 9.2 % (ref 0.0–14.0)
NEUT#: 2.6 10*3/uL (ref 1.5–6.5)
NEUT%: 65.8 % (ref 38.4–76.8)
Platelets: 304 10*3/uL (ref 145–400)
RBC: 3.76 10*6/uL (ref 3.70–5.45)
RDW: 19.3 % — ABNORMAL HIGH (ref 11.2–14.5)
WBC: 3.9 10*3/uL (ref 3.9–10.3)

## 2014-04-29 LAB — COMPREHENSIVE METABOLIC PANEL (CC13)
ALBUMIN: 3.3 g/dL — AB (ref 3.5–5.0)
ALT: 35 U/L (ref 0–55)
AST: 27 U/L (ref 5–34)
Alkaline Phosphatase: 58 U/L (ref 40–150)
Anion Gap: 7 mEq/L (ref 3–11)
BUN: 11.2 mg/dL (ref 7.0–26.0)
CALCIUM: 9.3 mg/dL (ref 8.4–10.4)
CO2: 26 mEq/L (ref 22–29)
Chloride: 110 mEq/L — ABNORMAL HIGH (ref 98–109)
Creatinine: 0.7 mg/dL (ref 0.6–1.1)
Glucose: 115 mg/dl (ref 70–140)
POTASSIUM: 4.1 meq/L (ref 3.5–5.1)
SODIUM: 143 meq/L (ref 136–145)
TOTAL PROTEIN: 6.8 g/dL (ref 6.4–8.3)
Total Bilirubin: 0.31 mg/dL (ref 0.20–1.20)

## 2014-04-29 NOTE — Telephone Encounter (Signed)
Per [facility] staff phone call scheduled appts. Advised schedulers 

## 2014-04-29 NOTE — Progress Notes (Signed)
[facility]  [phone]) [phone] Fax:([phone]) [phone]     ID: Rhonda [name] OB: November 25, 1970  MR#: [id number]  LGX#:[id number]  PCP: Tereasa Coop, PA-C GYN:  Mora Bellman, MD SU: Excell Seltzer, MD OTHER MD: Arloa Koh, MD  CHIEF COMPLAINT: Right breast cancer CURRENT TREATMENT: neoadjuvant chemotherapy  BREAST CANCER HISTORY: From the original intake note:  Rhonda [name] was visiting relatives in [place] about 4 months ago when she noted pain in her right breast. In 2003 she had a right breast infection which felt pretty much the same. She was treated with antibiotics back then (while living in [place]). Accordingly, this time she minimized the symptoms. It felt like milk was coming down she says. She also had breast pain while having her period. Then in February 2015, she actually found 2 lumps in her right breast breast which she initially ignored. Eventually as the symptoms seemed to progress she saw a Dr. In [place] and he told her she likely had breast cancer. She waited until [place] to seek definitive care.  On 12/17/2013 she had bilateral diagnostic mammography in [facility] In the upper outer right breast there were pleomorphic calcifications measuring approximately 9 cm in diameter. There were 4 distinct microlobulated masses in the right breast, 3 of them in the upper outer quadrant one in the lower outer quadrant. Each measures approximately 1.2 cm. Ultrasound showed a complex cystic mass in the superior central right breast, a 4 mm hypoechoic lesion, 3 suspicious solid masses elsewhere in the right breast, all of them hypoechoic and oval are round with increased vascularity. The largest of these masses measure 1.7 cm. Findings in the left breast were not suspicious or specific. The patient was referred to the breast Center where on 01/20/2014 she underwent biopsy of 2 of the right breast masses, and a right axillary lymph node.  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  On 01/28/2014 the patient underwent bilateral breast MRI. This showed, in the right breast, multiple irregular enhancing masses in the setting of clumped non-masslike enhancement the area in question measured up to 9.4 cm. There was diffuse asymmetric skin thickening and edema involving the right breast, but no obvious nipple or pectoralis involvement. There were several enlarged and morphologically abnormal right axillary lymph node as well as inter-pectoral and subpectoral adenopathy there the largest right axillary lymph node measured 2.7 cm. There was no internal mammary lymphadenopathy noted. The left breast was unremarkable.  Her subsequent history is as detailed below.  INTERVAL HISTORY: Rhonda [name] returns today for followup of her locally advanced breast cancer. Today is day 1 cycle 3 of her weekly paclitaxel treatments. She did a little bit better with the [place] last week, but continues to have many symptoms and does feel overwhelmed. She does not want to proceed to treatment today but wants to see how she feels next week.    REVIEW OF SYSTEMS: She goes to bed about 9 or 10 and then gets about 3 in the morning and is unable to go back to sleep. She tells me she is now taking naps during the day. She is not exercising because she is tired all the time. He doesn't feel like eating. She just had saw the dentist and she has some gum inflammation. This started her on amoxicillin which is also not  helping her nausea.  She feels that she might want to retch. She doesn't vomit however. She has joint pains here in there which she thinks are worse with chemotherapy. She has occasional headaches. She feels short of breath when climbing stairs. She said some loose bowel movements. A detailed review of systems  today was otherwise stable.  PAST MEDICAL HISTORY: Past Medical History  Diagnosis Date  . Hypertension   . Blood transfusion without reported diagnosis     3 previous   . Anemia   . Ulcer     hx of stomach ulcer a long time ago  . GERD (gastroesophageal reflux disease)     a long time ago  . Headache(784.0)   . Cancer 10/2013    right breast cancer  . Anxiety     PAST SURGICAL HISTORY: Past Surgical History  Procedure Laterality Date  . Cesarean section      2 previous  . Back surgery  2000  . Appendectomy      a long time ago in [place]  . Portacath placement N/A 02/06/2014    Procedure: INSERTION PORT-A-CATH;  Surgeon: Edward Jolly, MD;  Location: [facility];  Service: General;  Laterality: N/A;  . Port-a-cath removal Left 03/31/2014    Procedure: REMOVAL PORT-A-CATH;  Surgeon: Edward Jolly, MD;  Location: [facility];  Service: General;  Laterality: Left;  . Portacath placement Right 04/14/2014    Procedure: PLACEMENT OF PORT-A-CATH;  Surgeon: Edward Jolly, MD;  Location: [facility];  Service: General;  Laterality: Right;    FAMILY HISTORY Family History  Problem Relation Age of Onset  . Hypertension Mother   . Hypertension Father    the patient's parents are living but she is not sure of their age. She had 4 brothers and 4 sisters. 2 other brothers died in the turmoil [place] and one brother is currently in [place] but they do not know where are even if he is still alive. She has one brother in this area. There is no history of breast or ovarian cancer in the family to her knowledge.  GYNECOLOGIC HISTORY: (Reviewed 03/18/2014) Menarche somewhere between the ages of 64 and 34. First live birth age 72. The patient is GX P3. Her periods are now irregular. She took birth control pills remotely for approximately one year with no complaints.  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 Her husband Rhonda Shone  Lol Dalene [name] is a former Emergency planning/management officer to the [place] from [place] and is a Optometrist. Their children are Rhonda [name] (11),Rhonda [name] (8) and Rhonda [name] (4).  The youngest child is currently living with Apolonio Schneiders. The 2 older children are currently in [place]. The patient attends a [employer] locally    ADVANCED DIRECTIVES: Not in place   HEALTH MAINTENANCE: (Updated 03/18/2014) History  Substance Use Topics  . Smoking status: Never Smoker   . Smokeless tobacco: Never Used  . Alcohol Use: No     Colonoscopy: Never  PAP: April 2015/Dr. Constant  Bone density: Never  Lipid panel: Not on file  No Known Allergies  Current Outpatient Prescriptions  Medication Sig Dispense Refill  . acetaminophen (TYLENOL) 325 MG tablet Take 650 mg by mouth every 6 (six) hours as needed for mild pain or headache.       . Alum & Mag Hydroxide-Simeth (MAGIC MOUTHWASH W/LIDOCAINE) SOLN Take 5 mLs by mouth 4 (four) times daily as needed for mouth pain.  [place]  mL  1  . baclofen (LIORESAL) 10 MG tablet Take 10 mg by mouth every morning.       . cholecalciferol (VITAMIN D) 1000 UNITS tablet Take 1,000 Units by mouth daily.      Marland Kitchen dexamethasone (DECADRON) 4 MG tablet Take 8 mg by mouth 2 (two) times daily. Take for three days after chemo      . diazepam (VALIUM) 5 MG tablet Take 1 tablet (5 mg total) by mouth 2 (two) times daily.  10 tablet  0  . hydrochlorothiazide (MICROZIDE) 12.5 MG capsule Take 12.5 mg by mouth daily as needed (headache).       Marland Kitchen HYDROcodone-acetaminophen (NORCO/VICODIN) 5-325 MG per tablet Take 1-2 tablets by mouth every 4 (four) hours as needed (pain.).      Marland Kitchen lidocaine-prilocaine (EMLA) cream Apply to port 1 -2 hr before each procedure/port access as directed  30 g  6  . loratadine (CLARITIN) 10 MG tablet Take 10 mg by mouth daily.      . meclizine (ANTIVERT) 25 MG tablet Take 1 tablet (25 mg total) by mouth 3 (three) times daily as needed.  30 tablet  0  . omeprazole (PRILOSEC) 40 MG capsule Take  1 capsule (40 mg total) by mouth daily.  30 capsule  6  . ondansetron (ZOFRAN) 8 MG tablet Take 8 mg by mouth 2 (two) times daily as needed for nausea or vomiting (start the third day after chemo).      . prochlorperazine (COMPAZINE) 10 MG tablet Take 10 mg by mouth every 6 (six) hours as needed for nausea or vomiting (Take for three days after chemo).       No current facility-administered medications for this visit.    OBJECTIVE:    Filed Vitals:   04/29/14 0935  BP: 137/54  Pulse: 74  Temp: 98.4 F (36.9 C)  Resp: 18   Filed Weights   04/29/14 0935  Weight: 195 lb 14.4 oz (88.86 kg)      middle-aged African woman who appears fatigued  Sclerae unicteric, pupils round and equal Oropharynx clear and moist, teeth in poor repair No cervical or supraclavicular adenopathy Lungs no rales or rhonchi Heart regular rate and rhythm Abd soft, nontender, positive bowel sounds MSK no focal spinal tenderness, no upper extremity lymphedema Neuro: nonfocal, well oriented, depressed affect Breasts: Deferred   LAB RESULTS:   Lab Results  Component Value Date   WBC 3.9 04/29/2014   NEUTROABS 2.6 04/29/2014   HGB 8.6* 04/29/2014   HCT 27.7* 04/29/2014   MCV 73.7* 04/29/2014   PLT 304 04/29/2014      Chemistry      Component Value Date/Time   NA 143 04/29/2014 0919   NA 141 04/09/2014 1829   K 4.1 04/29/2014 0919   K 4.5 04/09/2014 1829   CL 103 04/09/2014 1829   CO2 26 04/29/2014 0919   CO2 27 04/09/2014 1829   BUN 11.2 04/29/2014 0919   BUN 17 04/09/2014 1829   CREATININE 0.7 04/29/2014 0919   CREATININE 0.78 04/09/2014 1829      Component Value Date/Time   CALCIUM 9.3 04/29/2014 0919   CALCIUM 9.4 04/09/2014 1829   ALKPHOS 58 04/29/2014 0919   ALKPHOS 83 02/17/2014 1527   AST 27 04/29/2014 0919   AST 14 02/17/2014 1527   ALT 35 04/29/2014 0919   ALT 19 02/17/2014 1527   BILITOT 0.31 04/29/2014 0919   BILITOT 1.2 02/17/2014 1527      STUDIES: Mr  Breast Bilateral W Wo Contrast  04/02/2014   CLINICAL  DATA:  Status post neoadjuvant chemotherapy for right breast invasive ductal carcinoma. Recently removed left anterior chest porta catheter (removed on 03/31/2014.  LABS:  None obtained today.  EXAM: BILATERAL BREAST MRI WITH AND WITHOUT CONTRAST  TECHNIQUE: Multiplanar, multisequence MR images of both breasts were obtained prior to and following the intravenous administration of 9m of MultiHance.  THREE-DIMENSIONAL MR IMAGE RENDERING ON INDEPENDENT WORKSTATION:  Three-dimensional MR images were rendered by post-processing of the original MR data on an independent workstation. The three-dimensional MR images were interpreted, and findings are reported in the following complete MRI report for this study. Three dimensional images were evaluated at the independent DynaCad workstation  COMPARISON:  Previous examinations, including the previous breast MR dated 01/27/2014.  FINDINGS: Breast composition: c:  Heterogeneous fibroglandular tissue  Background parenchymal enhancement: Minimal  Right breast: The previously demonstrated masses in the lateral right breast are smaller or resolved. The largest residual mass measures 9 x 7 mm in maximum dimensions on image number 113 of the initial postcontrast sequence. This previously measured 14 x 11 mm.  The previously described contiguous non mass enhancement in the right breast is again demonstrated, currently measuring 8.7 x 6.3 x 5.7 cm in maximum dimensions. This previously measured 9.4 x 7 x 5 cm in maximum dimensions.  Left breast: Interval postoperative cavity in the upper inner left breast with surrounding enhancement following porta catheter removal on 03/31/2014. Otherwise, no mass or abnormal enhancement seen.  Lymph nodes: Interval decrease in size of multiple enlarged right axillary nodes. The largest node currently measures 17 x 8 mm in maximum dimensions on image number 148 of the first post-contrast sequence and previously measured 28 x 13 mm.  Ancillary  findings:  None.  IMPRESSION: 1. Interval partial response to chemotherapy in the right breast. 2. Interval partial response of right axillary adenopathy to chemotherapy. 3. No new areas of malignancy.  RECOMMENDATION: Treatment plan.  BI-RADS CATEGORY  6: Known biopsy-proven malignancy.   Electronically Signed   By: SEnrique SackM.D.   On: 04/02/2014 16:16    CLINICAL DATA: Intermittent vertigo with room spinning since waking  this morning.  EXAM:  MRI HEAD WITHOUT AND WITH CONTRAST  TECHNIQUE:  Multiplanar, multiecho pulse sequences of the brain and surrounding  structures were obtained without and with intravenous contrast.  CONTRAST: 17mMULTIHANCE GADOBENATE DIMEGLUMINE 529 MG/ML IV SOLN  COMPARISON: CT head without contrast 04/09/2014  FINDINGS:  Extensive subcortical T2 hyperintensities are present bilaterally.  More minimal periventricular changes are seen. The  diffusion-weighted images demonstrate no evidence for acute or  subacute infarction. There is loss of normal T1 marrow signal.  Midline structures are otherwise within normal limits.  Flow is present in the major intracranial arteries. The globes and  orbits are intact. The postcontrast images demonstrates no  pathologic enhancement.  IMPRESSION:  1. Extensive subcortical white matter changes bilaterally. More  minimal periventricular change. The finding is nonspecific but can  be seen in the setting of chronic microvascular ischemia, a  demyelinating process such as multiple sclerosis, vasculitis,  complicated migraine headaches, or as the sequelae of a prior  infectious or inflammatory process. This could be related to  chemotherapy or radiation treatment as well.  2. No acute intracranial abnormality.  3. Loss of normal T1 marrow signal in the upper cervical spine and  calvarium compatible with the patient's known anemia.  Electronically Signed  By: ChBretta Bang.  On: 04/09/2014 20:49   ASSESSMENT: 43 y.o.  [place] woman [place]  (1)   status post right breast and right axillary lymph node biopsy 01/20/2014, both positive for a clinically T3 N1-2, clinical stage III a invasive ductal carcinoma, grade 2, estrogen and progesterone receptor negative, HER-2 positive, with an MIB-1 of 85%  (2)  treated neoadjuvantly with 4 dose dense cycles of doxorubicin/cyclophosphamide completed 03/25/2014, to be followed by weekly paclitaxel/ carboplatin x12, given along with trastuzumab/ pertuzumab every 3 weeks prior to definitive surgery.   (a) carboplatin was held with the second cycle because of poor tolerance  (b) day 1 cycle 3 (paclitaxel only) delayed one week at patient's request  (3)  Patient will likely need a right modified radical mastectomy, followed by postmastectomy radiation.   (4)  trastuzumab will be continued every 3 weeks for total of one year; most recent echocardiogram, 04/10/2014, showed a well preserved ejection fraction  PLAN: Jaziah doesn't feel she can proceed to chemotherapy today, even though it is only Taxol. I think it may be reasonable to switch her to Abraxane and CEA she tolerates that better. Part of the problem is her anemia, and that should improve on the current treatment. If it does not we should probably consider a transfusion.  There is an overlay of depression and anxiety because of her family situation, with her husband and 2 children in [place]. She is still hoping they will be coming soon.  As far as the nausea is concerned, I suggested she try some caramelized ginger, since the standard antinausea medications are not helping her very much.  I explained to Kamiah that I will not be here next week. She will see my partner Dr. Sonny Dandy. I am hopeful she will agree to proceed to Abraxane and trastuzumab/pertuzumab, but if she refuses the chemotherapy at least I would push for the anti-HER-2 immunotherapy. She will see me again the following week as already  scheduled.  Garnetta has a good understanding of the overall plan. She agrees with it. She knows the goal of treatment in her case is cure. She will call with any problems that may develop before the next visit here.

## 2014-05-05 ENCOUNTER — Telehealth: Payer: Self-pay | Admitting: *Deleted

## 2014-05-05 NOTE — Telephone Encounter (Signed)
Received vm call from pt stating that she needs to cancel her appt tomorrow for chem b/c she is out of town & unable to come.  Returned call & informed that she is in [place] to get a ticket to [place] to get her children.  She states she will call back to r/s but if she goes to [place] will probably be back in September.

## 2014-05-06 ENCOUNTER — Ambulatory Visit: Payer: Medicaid Other

## 2014-05-06 ENCOUNTER — Other Ambulatory Visit: Payer: Medicaid Other

## 2014-05-06 ENCOUNTER — Other Ambulatory Visit: Payer: Self-pay | Admitting: *Deleted

## 2014-05-13 ENCOUNTER — Ambulatory Visit: Payer: Medicaid Other | Admitting: Oncology

## 2014-05-13 ENCOUNTER — Other Ambulatory Visit: Payer: Medicaid Other

## 2014-05-13 ENCOUNTER — Ambulatory Visit: Payer: Medicaid Other

## 2014-06-10 ENCOUNTER — Telehealth: Payer: Self-pay | Admitting: Oncology

## 2014-06-11 ENCOUNTER — Telehealth: Payer: Self-pay | Admitting: Oncology

## 2014-06-11 ENCOUNTER — Other Ambulatory Visit: Payer: Self-pay | Admitting: Oncology

## 2014-06-11 NOTE — Telephone Encounter (Signed)
sch pt appt-will call pt with sch 9/17

## 2014-06-16 ENCOUNTER — Other Ambulatory Visit (HOSPITAL_BASED_OUTPATIENT_CLINIC_OR_DEPARTMENT_OTHER): Payer: Medicaid Other

## 2014-06-16 ENCOUNTER — Encounter: Payer: Self-pay | Admitting: Nurse Practitioner

## 2014-06-16 ENCOUNTER — Ambulatory Visit (HOSPITAL_BASED_OUTPATIENT_CLINIC_OR_DEPARTMENT_OTHER): Payer: Medicaid Other | Admitting: Nurse Practitioner

## 2014-06-16 VITALS — BP 164/83 | HR 61 | Temp 97.9°F | Resp 18 | Ht 70.0 in | Wt 189.8 lb

## 2014-06-16 DIAGNOSIS — C50411 Malignant neoplasm of upper-outer quadrant of right female breast: Secondary | ICD-10-CM

## 2014-06-16 DIAGNOSIS — I10 Essential (primary) hypertension: Secondary | ICD-10-CM

## 2014-06-16 DIAGNOSIS — C50419 Malignant neoplasm of upper-outer quadrant of unspecified female breast: Secondary | ICD-10-CM

## 2014-06-16 DIAGNOSIS — C50919 Malignant neoplasm of unspecified site of unspecified female breast: Secondary | ICD-10-CM

## 2014-06-16 DIAGNOSIS — C773 Secondary and unspecified malignant neoplasm of axilla and upper limb lymph nodes: Secondary | ICD-10-CM

## 2014-06-16 LAB — CBC WITH DIFFERENTIAL/PLATELET
BASO%: 0.3 % (ref 0.0–2.0)
Basophils Absolute: 0 10*3/uL (ref 0.0–0.1)
EOS%: 1.5 % (ref 0.0–7.0)
Eosinophils Absolute: 0.1 10*3/uL (ref 0.0–0.5)
HCT: 35.7 % (ref 34.8–46.6)
HGB: 10.8 g/dL — ABNORMAL LOW (ref 11.6–15.9)
LYMPH%: 45.9 % (ref 14.0–49.7)
MCH: 21.9 pg — AB (ref 25.1–34.0)
MCHC: 30.2 g/dL — AB (ref 31.5–36.0)
MCV: 72.4 fL — ABNORMAL LOW (ref 79.5–101.0)
MONO#: 0.3 10*3/uL (ref 0.1–0.9)
MONO%: 7.7 % (ref 0.0–14.0)
NEUT%: 44.6 % (ref 38.4–76.8)
NEUTROS ABS: 1.9 10*3/uL (ref 1.5–6.5)
PLATELETS: 200 10*3/uL (ref 145–400)
RBC: 4.93 10*6/uL (ref 3.70–5.45)
RDW: 16.9 % — AB (ref 11.2–14.5)
WBC: 4.3 10*3/uL (ref 3.9–10.3)
lymph#: 2 10*3/uL (ref 0.9–3.3)

## 2014-06-16 LAB — COMPREHENSIVE METABOLIC PANEL (CC13)
ALBUMIN: 3.7 g/dL (ref 3.5–5.0)
ALK PHOS: 86 U/L (ref 40–150)
ALT: 40 U/L (ref 0–55)
AST: 33 U/L (ref 5–34)
Anion Gap: 10 mEq/L (ref 3–11)
BUN: 13 mg/dL (ref 7.0–26.0)
CO2: 26 mEq/L (ref 22–29)
Calcium: 9.7 mg/dL (ref 8.4–10.4)
Chloride: 108 mEq/L (ref 98–109)
Creatinine: 0.8 mg/dL (ref 0.6–1.1)
Glucose: 112 mg/dl (ref 70–140)
POTASSIUM: 3.4 meq/L — AB (ref 3.5–5.1)
SODIUM: 144 meq/L (ref 136–145)
TOTAL PROTEIN: 7.8 g/dL (ref 6.4–8.3)
Total Bilirubin: 0.62 mg/dL (ref 0.20–1.20)
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
## 2014-06-17 ENCOUNTER — Telehealth: Payer: Self-pay | Admitting: *Deleted

## 2014-06-17 ENCOUNTER — Telehealth: Payer: Self-pay | Admitting: Nurse Practitioner

## 2014-06-17 NOTE — Telephone Encounter (Signed)
Per staff phone call and POF I have schedueld appts. Scheduler advised of appts.  JMW  

## 2014-06-17 NOTE — Telephone Encounter (Signed)
per pfo to sch pt appt-MW sch tr,t-gave pt copy of sch

## 2014-06-19 ENCOUNTER — Encounter: Payer: Self-pay | Admitting: *Deleted

## 2014-06-19 ENCOUNTER — Other Ambulatory Visit: Payer: Medicaid Other

## 2014-06-19 ENCOUNTER — Ambulatory Visit: Payer: Medicaid Other

## 2014-06-19 NOTE — Progress Notes (Signed)
[place] Work  Clinical Social Work was referred by Futures trader for assessment of psychosocial needs due to possible childcare concerns.  Clinical Social Worker attempted to met with patient in the infusion room, but she did not come to her appointment today. CSW also called both home and cell phone numbers, but received no answer and there is no way to leave a message at either number.  CSW left word for RN in the infusion room to phone CSW if pt arrived. CSW has list of possible childcare options for pt, but feels there maybe different reasons for her failure to show today. CSW will continue to be available as needed.   Clinical Social Work interventions: Resource assistance  Loren Racer, [place] Worker [place]  [place] Phone: [phone] Fax: [phone]

## 2014-07-09 ENCOUNTER — Other Ambulatory Visit: Payer: Self-pay | Admitting: Emergency Medicine

## 2014-07-10 ENCOUNTER — Ambulatory Visit: Payer: Medicaid Other | Admitting: Oncology

## 2014-07-10 ENCOUNTER — Ambulatory Visit: Payer: Medicaid Other

## 2014-07-10 ENCOUNTER — Telehealth: Payer: Self-pay | Admitting: Oncology

## 2014-07-10 ENCOUNTER — Other Ambulatory Visit: Payer: Medicaid Other

## 2014-07-10 NOTE — Telephone Encounter (Signed)
, °

## 2014-07-28 ENCOUNTER — Encounter: Payer: Self-pay | Admitting: Nurse Practitioner

## 2014-08-04 ENCOUNTER — Telehealth: Payer: Self-pay | Admitting: *Deleted

## 2014-08-04 ENCOUNTER — Ambulatory Visit (HOSPITAL_COMMUNITY)
Admission: RE | Admit: 2014-08-04 | Discharge: 2014-08-04 | Disposition: A | Discharge status: Home / Self Care | Payer: Medicaid Other | Source: Ambulatory Visit | Attending: Nurse Practitioner | Admitting: Nurse Practitioner

## 2014-08-04 ENCOUNTER — Encounter (HOSPITAL_COMMUNITY): Payer: Self-pay

## 2014-08-04 ENCOUNTER — Ambulatory Visit (HOSPITAL_BASED_OUTPATIENT_CLINIC_OR_DEPARTMENT_OTHER): Payer: Medicaid Other

## 2014-08-04 ENCOUNTER — Ambulatory Visit (HOSPITAL_BASED_OUTPATIENT_CLINIC_OR_DEPARTMENT_OTHER): Payer: Medicaid Other | Admitting: Nurse Practitioner

## 2014-08-04 ENCOUNTER — Other Ambulatory Visit: Payer: Self-pay

## 2014-08-04 VITALS — BP 133/84 | HR 62 | Temp 98.6°F | Resp 20 | Wt 186.0 lb

## 2014-08-04 DIAGNOSIS — M542 Cervicalgia: Secondary | ICD-10-CM

## 2014-08-04 DIAGNOSIS — R079 Chest pain, unspecified: Secondary | ICD-10-CM | POA: Diagnosis not present

## 2014-08-04 DIAGNOSIS — Z95828 Presence of other vascular implants and grafts: Secondary | ICD-10-CM

## 2014-08-04 DIAGNOSIS — C50411 Malignant neoplasm of upper-outer quadrant of right female breast: Secondary | ICD-10-CM

## 2014-08-04 DIAGNOSIS — R221 Localized swelling, mass and lump, neck: Secondary | ICD-10-CM

## 2014-08-04 DIAGNOSIS — D6481 Anemia due to antineoplastic chemotherapy: Secondary | ICD-10-CM

## 2014-08-04 DIAGNOSIS — T451X5A Adverse effect of antineoplastic and immunosuppressive drugs, initial encounter: Secondary | ICD-10-CM

## 2014-08-04 LAB — CBC WITH DIFFERENTIAL/PLATELET
BASO%: 0.4 % (ref 0.0–2.0)
Basophils Absolute: 0 10*3/uL (ref 0.0–0.1)
EOS ABS: 0.1 10*3/uL (ref 0.0–0.5)
EOS%: 1.7 % (ref 0.0–7.0)
HEMATOCRIT: 38 % (ref 34.8–46.6)
HGB: 11.6 g/dL (ref 11.6–15.9)
LYMPH%: 48.1 % (ref 14.0–49.7)
MCH: 21 pg — ABNORMAL LOW (ref 25.1–34.0)
MCHC: 30.5 g/dL — AB (ref 31.5–36.0)
MCV: 68.8 fL — ABNORMAL LOW (ref 79.5–101.0)
MONO#: 0.6 10*3/uL (ref 0.1–0.9)
MONO%: 10.7 % (ref 0.0–14.0)
NEUT#: 2 10*3/uL (ref 1.5–6.5)
NEUT%: 39.1 % (ref 38.4–76.8)
PLATELETS: 225 10*3/uL (ref 145–400)
RBC: 5.52 10*6/uL — ABNORMAL HIGH (ref 3.70–5.45)
RDW: 16.7 % — ABNORMAL HIGH (ref 11.2–14.5)
WBC: 5.2 10*3/uL (ref 3.9–10.3)
lymph#: 2.5 10*3/uL (ref 0.9–3.3)

## 2014-08-04 LAB — COMPREHENSIVE METABOLIC PANEL (CC13)
ALT: 23 U/L (ref 0–55)
ANION GAP: 6 meq/L (ref 3–11)
AST: 23 U/L (ref 5–34)
Albumin: 3.8 g/dL (ref 3.5–5.0)
Alkaline Phosphatase: 95 U/L (ref 40–150)
BILIRUBIN TOTAL: 0.45 mg/dL (ref 0.20–1.20)
BUN: 10.4 mg/dL (ref 7.0–26.0)
CO2: 29 meq/L (ref 22–29)
CREATININE: 0.7 mg/dL (ref 0.6–1.1)
Calcium: 9.6 mg/dL (ref 8.4–10.4)
Chloride: 107 mEq/L (ref 98–109)
GLUCOSE: 86 mg/dL (ref 70–140)
Potassium: 4.2 mEq/L (ref 3.5–5.1)
Sodium: 141 mEq/L (ref 136–145)
Total Protein: 7.9 g/dL (ref 6.4–8.3)

## 2014-08-04 MED ORDER — SODIUM CHLORIDE 0.9 % IJ SOLN
10.0000 mL | INTRAMUSCULAR | Status: DC | PRN
  Administered 2014-08-04: 10 mL via INTRAVENOUS
  Filled 2014-08-04: qty 10

## 2014-08-04 MED ORDER — HEPARIN SOD (PORK) LOCK FLUSH 100 UNIT/ML IV SOLN
500.0000 [IU] | Freq: Once | INTRAVENOUS | Status: AC
  Administered 2014-08-04: 500 [IU] via INTRAVENOUS
  Filled 2014-08-04: qty 5

## 2014-08-04 MED ORDER — IOHEXOL 350 MG/ML SOLN
100.0000 mL | Freq: Once | INTRAVENOUS | Status: AC | PRN
  Administered 2014-08-04: 100 mL via INTRAVENOUS

## 2014-08-04 NOTE — Progress Notes (Signed)
Blood drawn from port per Retta Mac, NP. Pt c/o of being very tender when accessed and burning while pushing the saline. 10cc of blood return noted.  Patient too uncomfortable to continue trying to draw blood. Flushed with saline and 5cc heparin and needle removed.

## 2014-08-04 NOTE — Telephone Encounter (Signed)
Received call from patient stating she had called [facility] on Friday due to some pain where her port is that is now radiating up neck and left arm.  She stated she was instructed by [facility] to go to the ED which she did not and would follow back up on Monday.  Appointment made to Selena Lesser, NP to be evaluated.  Confirmed appointment for 08/04/14 at 2pm.  Informed her we also need to get her scheduled for herceptin as she has missed a few appointments.  Patient verbalized understanding.

## 2014-08-05 ENCOUNTER — Other Ambulatory Visit: Payer: Self-pay | Admitting: *Deleted

## 2014-08-05 ENCOUNTER — Telehealth: Payer: Self-pay | Admitting: *Deleted

## 2014-08-05 ENCOUNTER — Encounter: Payer: Self-pay | Admitting: Nurse Practitioner

## 2014-08-05 DIAGNOSIS — M542 Cervicalgia: Secondary | ICD-10-CM | POA: Insufficient documentation

## 2014-08-05 NOTE — Assessment & Plan Note (Signed)
Patient presented today with complaint of right neck pain that does occasionally radiate from her right Port-A-Cath site to her left breast.  She denies any specific chest pain, chest pressure, or shortness of breath.  She denies any pain with inspiration; but does occasionally feel short of breath with exertion.  She denies any recent productive cough, fever, or chills.  CT angiogram of the chest obtained today was negative for pulmonary embolism.  No other acute cardiopulmonary disease noted.  A right-sided Port-A-Cath was noted to have no fluid or inflammatory change adjacent to the Port-A-Cath.  It was noted that the Port-A-Cath coursed into the region of the right subclavian vein; but the tip was not seen.  Changes of the right breast compatible with the known history of breast cancer posttreatment.  Interval improvement in the previously noted right axillary/subpectoral adenopathy.  EKG obtained revealed a normal sinus rhythm.  There was some questionable mild edema to patient's right neck area; but no tenderness with palpation.  Right upper chest Port-A-Cath site nontender;  with no erythema, edema, warmth, or red streaks.  No evidence of distended veins surrounding the Port-A-Cath site.  All pulses are palpable in all extremities are warm.  Patient has full range of motion with all extremities as well.  After careful review of patient's symptoms and subsequent findings with Dr. Ricki Miller arranged for initial surgical consult/evaluation with Dr. Brantley Stage for this coming Tuesday, 08/12/2014.  Hopefully, Dr. Brantley Stage will be able to evaluate the patient for pending breast surgery; as well as further evaluation regarding patient's Port-A-Cath and questionable need for removal of the Port-A-Cath.  Patient will also be made a followup appointment with Dr. Jana Hakim for next week as well.  Patient has been advised to go to the to the emergency department if she develops any new or worsening symptoms  whatsoever prior to her appointment with Dr. Brantley Stage early next week.  Patient stated understanding of all instructions was in agreement with this plan of care.

## 2014-08-05 NOTE — Telephone Encounter (Signed)
Called pt on cell phone and informed pt re: 1.   Dr. Jana Hakim did not want pt to have doppler study at neck area for now. 2.   A scheduler will contact pt with appts for lab, office visit , and resume Herceptin  As per Dr. Virgie Dad orders. 3.   Pt has appt with Dr. Brantley Stage on  08/12/14  At  0920 am ;  Pt  To arrive by  845 am to register. Informed pt that Dr. Brantley Stage will evaluate and to discuss plan of care with pt at office visit.   Reinforced that pt needs to keep all appts as scheduled.   Pt voiced understanding.

## 2014-08-05 NOTE — Assessment & Plan Note (Addendum)
Patient completed neoadjuvant doxorubicin/cyclophosphamide chemotherapy on 03/25/2014.  She received 1 cycle of weekly paclitaxel/carboplatin chemotherapy on 04/16/2014.  The carboplatin portion of her chemotherapy was then discontinued due to intolerance.  Patient received her last weekly paclitaxel on 04/22/2014.  Patient also initiated Herceptin/Perjeta on 04/16/2014 as well.  This was to be cycled on an every three-week basis.  However, patient missed all further chemotherapy/Herceptin/Perjeta infusions; stating that the chemotherapy side effects were intolerable for her since she has to take care of her 3 children alone.    The plan was for the patient to complete her weekly paclitaxel; as well as her Herceptin/Perjeta; followed by breast surgery.  She would then most likely be referred to radiation oncology for probable radiation therapy.  Patient's last echo was obtained 04/10/2014.  Have arranged for initial surgical consult with Dr. Brantley Stage this coming Tuesday, 08/12/2014.  Will also make arrangements for patient to followup with Dr. Jana Hakim as well.

## 2014-08-05 NOTE — Progress Notes (Signed)
SYMPTOM MANAGEMENT CLINIC   HPI: Rhonda [name] 43 y.o. female diagnosed with breast cancer.  Patient is status post neoadjuvant doxorubicin/cyclophosphamide chemotherapy completed on 03/25/2014.  She received 1 cycle of weekly carboplatin; and 2 cycles of weekly paclitaxel only.  She last received Herceptin/Perjeta on 04/16/2014.  Patient called the cancer Center today requesting urgent care visit.  Patient states that she last received on neoadjuvant chemotherapy/Herceptin/Perjeta in late July 2015.  She states that the chemotherapy side effects were intolerable for her; stating that she has 3 young children to take care of by herself.  She has also recently returned from a trip back home to [place].  Patient also states that she did not go to an initial surgical consult for planned breast surgery as previously directed.  Patient states that she has developed some right neck discomfort and questionable edema; but does occasionally radiate from her right Port-A-Cath site to her left breast.  She denies any specific chest pain, chest pressure, or shortness of breath.  She denies any pain with inspiration.  She denies any other GI symptoms or dysuria.  She also denies any recent fevers or chills.   HPI  CURRENT THERAPY: Upcoming Treatment Dates - BREAST Paclitaxel / Carboplatin q7d Days with orders from any treatment category:  04/29/2014      SCHEDULING COMMUNICATION      diphenhydrAMINE (BENADRYL) injection 25 mg      Dexamethasone Sodium Phosphate (DECADRON) injection 20 mg      ondansetron (ZOFRAN) IVPB 16 mg      famotidine (PEPCID) IVPB 20 mg      PACLitaxel (TAXOL) 162 mg in dextrose 5 % 250 mL chemo infusion (</= 80mg /m2)      sodium chloride 0.9 % injection 10 mL      heparin lock flush 100 unit/mL      heparin lock flush 100 unit/mL      alteplase (CATHFLO ACTIVASE) injection 2 mg      sodium chloride 0.9 % injection 3 mL      Cold Pack 1 packet      diphenhydrAMINE  (BENADRYL) injection 25 mg      famotidine (PEPCID) IVPB 20 mg      0.9 %  sodium chloride infusion      methylPREDNISolone sodium succinate (SOLU-MEDROL) 125 mg/2 mL injection 125 mg      EPINEPHrine (ADRENALIN) 0.1 MG/ML injection 0.25 mg      EPINEPHrine (ADRENALIN) 0.1 MG/ML injection 0.25 mg      EPINEPHrine (ADRENALIN) injection 0.5 mg      EPINEPHrine (ADRENALIN) injection 0.5 mg      diphenhydrAMINE (BENADRYL) injection 25 mg      albuterol (PROVENTIL) (2.5 MG/3ML) 0.083% nebulizer solution 2.5 mg      0.9 %  sodium chloride infusion      TREATMENT CONDITIONS    Review of Systems  Constitutional: Negative.   HENT: Positive for ear pain. Negative for congestion, hearing loss and sore throat.        Complaint of occasional, mild right neck pain; with some right neck edema noted.  Eyes: Negative.   Respiratory: Negative.   Cardiovascular: Positive for chest pain. Negative for palpitations, orthopnea, claudication, leg swelling and PND.  Gastrointestinal: Negative.   Genitourinary: Negative.   Musculoskeletal: Negative.   Skin: Negative.   Neurological: Negative.  Negative for headaches.  Endo/Heme/Allergies: Negative.   Psychiatric/Behavioral: Negative.   All other systems reviewed and are negative.   Past Medical History  Diagnosis Date  . Hypertension   . Blood transfusion without reported diagnosis     3 previous   . Anemia   . Ulcer     hx of stomach ulcer a long time ago  . GERD (gastroesophageal reflux disease)     a long time ago  . Headache(784.0)   . Cancer 10/2013    right breast cancer  . Anxiety     Past Surgical History  Procedure Laterality Date  . Cesarean section      2 previous  . Back surgery  2000  . Appendectomy      a long time ago in [place]  . Portacath placement N/A 02/06/2014    Procedure: INSERTION PORT-A-CATH;  Surgeon: Edward Jolly, MD;  Location: [facility];  Service: General;  Laterality: N/A;  . Port-a-cath removal Left  03/31/2014    Procedure: REMOVAL PORT-A-CATH;  Surgeon: Edward Jolly, MD;  Location: [place];  Service: General;  Laterality: Left;  . Portacath placement Right 04/14/2014    Procedure: PLACEMENT OF PORT-A-CATH;  Surgeon: Edward Jolly, MD;  Location: [place];  Service: General;  Laterality: Right;    has Breast cancer of upper-outer quadrant of right female breast; GERD (gastroesophageal reflux disease); Nausea alone; Oral pain; and Neck pain on her problem list.     has No Known Allergies.    Medication List       This list is accurate as of: 08/04/14 11:59 PM.  Always use your most recent med list.               acetaminophen 325 MG tablet  Commonly known as:  TYLENOL  Take 650 mg by mouth every 6 (six) hours as needed for mild pain or headache.     baclofen 10 MG tablet  Commonly known as:  LIORESAL  Take 10 mg by mouth every morning.     cholecalciferol 1000 UNITS tablet  Commonly known as:  VITAMIN D  Take 1,000 Units by mouth daily.     dexamethasone 4 MG tablet  Commonly known as:  DECADRON  Take 8 mg by mouth 2 (two) times daily. Take for three days after chemo     diazepam 5 MG tablet  Commonly known as:  VALIUM  Take 1 tablet (5 mg total) by mouth 2 (two) times daily.     HYDROcodone-acetaminophen 5-325 MG per tablet  Commonly known as:  NORCO/VICODIN  Take 1-2 tablets by mouth every 4 (four) hours as needed (pain.).     lidocaine-prilocaine cream  Commonly known as:  EMLA  Apply to port 1 -2 hr before each procedure/port access as directed     loratadine 10 MG tablet  Commonly known as:  CLARITIN  Take 10 mg by mouth daily.     magic mouthwash w/lidocaine Soln  Take 5 mLs by mouth 4 (four) times daily as needed for mouth pain.     meclizine 25 MG tablet  Commonly known as:  ANTIVERT  Take 1 tablet (25 mg total) by mouth 3 (three) times daily as needed.     MICROZIDE 12.5 MG capsule  Generic drug:   hydrochlorothiazide  Take 12.5 mg by mouth daily as needed (headache).     omeprazole 40 MG capsule  Commonly known as:  PRILOSEC  Take 1 capsule (40 mg total) by mouth daily.     ondansetron 8 MG tablet  Commonly known as:  ZOFRAN  Take 8 mg  by mouth 2 (two) times daily as needed for nausea or vomiting (start the third day after chemo).     prochlorperazine 10 MG tablet  Commonly known as:  COMPAZINE  Take 10 mg by mouth every 6 (six) hours as needed for nausea or vomiting (Take for three days after chemo).         PHYSICAL EXAMINATION  Blood pressure 133/84, pulse 62, temperature 98.6 F (37 C), resp. rate 20, weight 186 lb (84.369 kg), last menstrual period 12/02/2013, SpO2 100 %.  Physical Exam  Constitutional: She is oriented to person, place, and time and well-developed, well-nourished, and in no distress.  HENT:  Head: Normocephalic and atraumatic.  Right Ear: External ear normal.  Left Ear: External ear normal.  Nose: Nose normal.  Mouth/Throat: Oropharynx is clear and moist.  Eyes: Conjunctivae and EOM are normal. Pupils are equal, round, and reactive to light. Right eye exhibits no discharge. Left eye exhibits no discharge. No scleral icterus.  Neck: Normal range of motion. Neck supple. No JVD present. No tracheal deviation present. No thyromegaly present.  Right cervical neck with questionable, trace edema noted.  This area is nontender.  There was also note erythema, warmth, or red streaks noted on exam.  Cardiovascular: Normal rate, regular rhythm and intact distal pulses.  Exam reveals no friction rub.   No murmur heard. Pulmonary/Chest: Effort normal and breath sounds normal. No stridor. No respiratory distress. She has no wheezes. She has no rales.  Abdominal: Soft. Bowel sounds are normal. She exhibits no distension. There is no tenderness. There is no rebound.  Musculoskeletal: Normal range of motion. She exhibits no edema or tenderness.  Neurological: She is  alert and oriented to person, place, and time. Gait normal.  Skin: Skin is warm and dry. No rash noted. No erythema.  Psychiatric: Affect normal.  Nursing note and vitals reviewed.   LABORATORY DATA:. Appointment on 08/04/2014  Component Date Value Ref Range Status  . WBC 08/04/2014 5.2  3.9 - 10.3 10e3/uL Final  . NEUT# 08/04/2014 2.0  1.5 - 6.5 10e3/uL Final  . HGB 08/04/2014 11.6  11.6 - 15.9 g/dL Final  . HCT 08/04/2014 38.0  34.8 - 46.6 % Final  . Platelets 08/04/2014 225  145 - 400 10e3/uL Final  . MCV 08/04/2014 68.8* 79.5 - 101.0 fL Final  . MCH 08/04/2014 21.0* 25.1 - 34.0 pg Final  . MCHC 08/04/2014 30.5* 31.5 - 36.0 g/dL Final  . RBC 08/04/2014 5.52* 3.70 - 5.45 10e6/uL Final  . RDW 08/04/2014 16.7* 11.2 - 14.5 % Final  . lymph# 08/04/2014 2.5  0.9 - 3.3 10e3/uL Final  . MONO# 08/04/2014 0.6  0.1 - 0.9 10e3/uL Final  . Eosinophils Absolute 08/04/2014 0.1  0.0 - 0.5 10e3/uL Final  . Basophils Absolute 08/04/2014 0.0  0.0 - 0.1 10e3/uL Final  . NEUT% 08/04/2014 39.1  38.4 - 76.8 % Final  . LYMPH% 08/04/2014 48.1  14.0 - 49.7 % Final  . MONO% 08/04/2014 10.7  0.0 - 14.0 % Final  . EOS% 08/04/2014 1.7  0.0 - 7.0 % Final  . BASO% 08/04/2014 0.4  0.0 - 2.0 % Final  . Sodium 08/04/2014 141  136 - 145 mEq/L Final  . Potassium 08/04/2014 4.2  3.5 - 5.1 mEq/L Final  . Chloride 08/04/2014 107  98 - 109 mEq/L Final  . CO2 08/04/2014 29  22 - 29 mEq/L Final  . Glucose 08/04/2014 86  70 - 140 mg/dl Final  . BUN  08/04/2014 10.4  7.0 - 26.0 mg/dL Final  . Creatinine 08/04/2014 0.7  0.6 - 1.1 mg/dL Final  . Total Bilirubin 08/04/2014 0.45  0.20 - 1.20 mg/dL Final  . Alkaline Phosphatase 08/04/2014 95  40 - 150 U/L Final  . AST 08/04/2014 23  5 - 34 U/L Final  . ALT 08/04/2014 23  0 - 55 U/L Final  . Total Protein 08/04/2014 7.9  6.4 - 8.3 g/dL Final  . Albumin 08/04/2014 3.8  3.5 - 5.0 g/dL Final  . Calcium 08/04/2014 9.6  8.4 - 10.4 mg/dL Final  . Anion Gap 08/04/2014 6  3 - 11  mEq/L Final   EKG:  JANEL, BEANE IO:[id number] 04-Aug-2014 14:08:51 [facility] ROUTINE RECORD Normal sinus rhythm Normal ECG Confirmed by [facility] MD, AJAY ([id number]) on 08/04/2014 3:53:27 PM 44mm/s 29mm/mV 100Hz  8.0 SP2 12SL 237 CID: [id number] Referred by: ASHLEY LONG Confirmed By: Dixie Dials MD Vent. rate 60 BPM PR interval 184 ms QRS duration 78 ms QT/QTc 412/412 ms P-R-T axes 38 1 26 [birth year] (43 yr) Female Black 70in 185lb Room: Loc:511 Technician: LETITIA Test ind: [place]:  RADIOGRAPHIC STUDIES: Ct Angio Chest Pe W/cm &/or Wo Cm  08/04/2014   CLINICAL DATA:  Intermittent chest pain surrounding right port; radiating to right neck and left chest. Denies SOB. Dx'd breast cancer.Rt chest pain since 08/01/14.  EXAM: CT ANGIOGRAPHY CHEST WITH CONTRAST  TECHNIQUE: Multidetector CT imaging of the chest was performed using the standard protocol during bolus administration of intravenous contrast. Multiplanar CT image reconstructions and MIPs were obtained to evaluate the vascular anatomy.  CONTRAST:  132mL OMNIPAQUE IOHEXOL 350 MG/ML SOLN  COMPARISON:  Chest x-ray 03/2014 as well as previous chest CT 02/10/2014  FINDINGS: Patient's right-sided Port-A-Cath is within normal without adjacent fluid or inflammatory change. The tip of patient's Port-A-Cath is not definitely seen although appears to extend into the region of the right subclavian vein. Lungs are adequately inflated without consolidation or effusion. There is no evidence of pulmonary nodules/ masses. Airways are within normal. Heart is mildly enlarged. Pulmonary tear is system is within normal without evidence of emboli. There is no mediastinal or hilar adenopathy. Remaining mediastinal structures are unremarkable. Previous noted right axillary adenopathy demonstrates significant interval improvement with only a few small residual axillary nodes and subpectoral nodes seen. Stable right breast skin thickening compatible  with known right breast cancer post treatment.  Images through the upper abdomen are unchanged.  The  Review of the MIP images confirms the above findings.  IMPRESSION: No evidence of pulmonary embolism. No acute cardiopulmonary disease.  Right-sided Port-A-Cath present. No fluid or inflammatory change adjacent to the Port-A-Cath. Notes the Port-A-Cath courses into the region of the right subclavian vein and tip is not seen. Recommend followup chest radiograph to assess catheter position.  Changes of the right breast compatible with known history of breast cancer post treatment. Interval improvement in the previously noted right axillary/ subpectoral adenopathy.  These results will be called to the ordering clinician or representative by the Radiologist Assistant, and communication documented in the PACS or zVision Dashboard.   Electronically Signed   By: Marin Olp M.D.   On: 08/04/2014 17:04    ASSESSMENT/PLAN:    Breast Cancer: Patient completed neoadjuvant doxorubicin/cyclophosphamide chemotherapy on 03/25/2014.  She received 1 cycle of weekly paclitaxel/carboplatin chemotherapy on 04/16/2014.  The carboplatin portion of her chemotherapy was then discontinued due to intolerance.  Patient received her last weekly paclitaxel on 04/22/2014.  Patient  also initiated Herceptin/Perjeta on 04/16/2014 as well.  This was to be cycled on an every three-week basis.  However, patient missed all further chemotherapy/Herceptin/Perjeta infusions; stating that the chemotherapy side effects were intolerable for her since she has to take care of her 3 children alone.    The plan was for the patient to complete her weekly paclitaxel; as well as her Herceptin/Perjeta; followed by breast surgery.  She would then most likely be referred to radiation oncology for probable radiation therapy.  Patient's last echo was obtained 04/10/2014.  Have arranged for initial surgical consult with Dr. Brantley Stage this coming Tuesday,  08/12/2014.  Will also make arrangements for patient to followup with Dr. Jana Hakim as well.     Neck Pain: Patient presented today with complaint of right neck pain that does occasionally radiate from her right Port-A-Cath site to her left breast.  She denies any specific chest pain, chest pressure, or shortness of breath.  She denies any pain with inspiration; but does occasionally feel short of breath with exertion.  She denies any recent productive cough, fever, or chills.  CT angiogram of the chest obtained today was negative for pulmonary embolism.  No other acute cardiopulmonary disease noted.  A right-sided Port-A-Cath was noted to have no fluid or inflammatory change adjacent to the Port-A-Cath.  It was noted that the Port-A-Cath coursed into the region of the right subclavian vein; but the tip was not seen.  Changes of the right breast compatible with the known history of breast cancer posttreatment.  Interval improvement in the previously noted right axillary/subpectoral adenopathy.  EKG obtained revealed a normal sinus rhythm.  There was some questionable mild edema to patient's right neck area; but no tenderness with palpation.  Right upper chest Port-A-Cath site nontender;  with no erythema, edema, warmth, or red streaks.  No evidence of distended veins surrounding the Port-A-Cath site.  All pulses are palpable in all extremities are warm.  Patient has full range of motion with all extremities as well.  After careful review of patient's symptoms and subsequent findings with Dr. Ricki Miller arranged for initial surgical consult/evaluation with Dr. Brantley Stage for this coming Tuesday, 08/12/2014.  Hopefully, Dr. Brantley Stage will be able to evaluate the patient for pending breast surgery; as well as further evaluation regarding patient's Port-A-Cath and questionable need for removal of the Port-A-Cath.  Patient will also be made a followup appointment with Dr. Jana Hakim for next week as  well.  Patient has been advised to go to the to the emergency department if she develops any new or worsening symptoms whatsoever prior to her appointment with Dr. Brantley Stage early next week.  Patient stated understanding of all instructions was in agreement with this plan of care.   Patient stated understanding of all instructions; and was in agreement with this plan of care. The patient knows to call the clinic with any problems, questions or concerns.   Review/collaboration with Dr. Jana Hakim regarding all aspects of patient's visit today.   Total time spent with patient was 40 minutes;  with greater than 75 percent of that time spent in face to face counseling regarding her symptoms, and coordination of care and follow up.  Disclaimer: This note was dictated with voice recognition software. Similar sounding words can inadvertently be transcribed and may not be corrected upon review.   Drue Second, NP 08/05/2014

## 2014-08-06 ENCOUNTER — Telehealth: Payer: Self-pay | Admitting: Nurse Practitioner

## 2014-08-06 ENCOUNTER — Telehealth: Payer: Self-pay | Admitting: *Deleted

## 2014-08-06 NOTE — Telephone Encounter (Signed)
, °

## 2014-08-06 NOTE — Telephone Encounter (Signed)
Received call from [place] at [facility] stating that pt needs to see Dr. Excell Seltzer since he placed the [facility]. First available appt is 08/15/14. Jearld Fenton will call pt and reschedule her to see him. Selena Lesser, NP notified of this change in appt.

## 2014-08-07 ENCOUNTER — Telehealth: Payer: Self-pay | Admitting: *Deleted

## 2014-08-07 NOTE — Telephone Encounter (Signed)
Per staff message and POF I have scheduled appts. Advised scheduler of appts. JMW  

## 2014-08-11 ENCOUNTER — Ambulatory Visit: Payer: Medicaid Other

## 2014-08-11 ENCOUNTER — Telehealth: Payer: Self-pay | Admitting: *Deleted

## 2014-08-11 ENCOUNTER — Other Ambulatory Visit (HOSPITAL_BASED_OUTPATIENT_CLINIC_OR_DEPARTMENT_OTHER): Payer: Medicaid Other

## 2014-08-11 ENCOUNTER — Ambulatory Visit (HOSPITAL_BASED_OUTPATIENT_CLINIC_OR_DEPARTMENT_OTHER): Payer: Medicaid Other | Admitting: Nurse Practitioner

## 2014-08-11 ENCOUNTER — Encounter: Payer: Self-pay | Admitting: Nurse Practitioner

## 2014-08-11 VITALS — BP 130/48 | HR 59 | Temp 98.6°F | Resp 18 | Ht 70.0 in | Wt 190.1 lb

## 2014-08-11 DIAGNOSIS — C50411 Malignant neoplasm of upper-outer quadrant of right female breast: Secondary | ICD-10-CM

## 2014-08-11 DIAGNOSIS — C50811 Malignant neoplasm of overlapping sites of right female breast: Secondary | ICD-10-CM

## 2014-08-11 DIAGNOSIS — C773 Secondary and unspecified malignant neoplasm of axilla and upper limb lymph nodes: Secondary | ICD-10-CM

## 2014-08-11 DIAGNOSIS — Z171 Estrogen receptor negative status [ER-]: Secondary | ICD-10-CM

## 2014-08-11 LAB — COMPREHENSIVE METABOLIC PANEL (CC13)
ALK PHOS: 91 U/L (ref 40–150)
ALT: 26 U/L (ref 0–55)
AST: 29 U/L (ref 5–34)
Albumin: 3.5 g/dL (ref 3.5–5.0)
Anion Gap: 6 mEq/L (ref 3–11)
BUN: 10.8 mg/dL (ref 7.0–26.0)
CO2: 26 mEq/L (ref 22–29)
Calcium: 9.5 mg/dL (ref 8.4–10.4)
Chloride: 108 mEq/L (ref 98–109)
Creatinine: 0.7 mg/dL (ref 0.6–1.1)
GLUCOSE: 78 mg/dL (ref 70–140)
POTASSIUM: 4.4 meq/L (ref 3.5–5.1)
SODIUM: 140 meq/L (ref 136–145)
TOTAL PROTEIN: 7.3 g/dL (ref 6.4–8.3)
Total Bilirubin: 0.36 mg/dL (ref 0.20–1.20)

## 2014-08-11 LAB — CBC WITH DIFFERENTIAL/PLATELET
BASO%: 0.5 % (ref 0.0–2.0)
Basophils Absolute: 0 10*3/uL (ref 0.0–0.1)
EOS%: 2.1 % (ref 0.0–7.0)
Eosinophils Absolute: 0.1 10*3/uL (ref 0.0–0.5)
HCT: 35.3 % (ref 34.8–46.6)
HGB: 11.3 g/dL — ABNORMAL LOW (ref 11.6–15.9)
LYMPH#: 2.1 10*3/uL (ref 0.9–3.3)
LYMPH%: 56.1 % — ABNORMAL HIGH (ref 14.0–49.7)
MCH: 21.5 pg — ABNORMAL LOW (ref 25.1–34.0)
MCHC: 32 g/dL (ref 31.5–36.0)
MCV: 67.2 fL — ABNORMAL LOW (ref 79.5–101.0)
MONO#: 0.3 10*3/uL (ref 0.1–0.9)
MONO%: 6.8 % (ref 0.0–14.0)
NEUT#: 1.3 10*3/uL — ABNORMAL LOW (ref 1.5–6.5)
NEUT%: 34.5 % — ABNORMAL LOW (ref 38.4–76.8)
NRBC: 0 % (ref 0–0)
Platelets: 208 10*3/uL (ref 145–400)
RBC: 5.25 10*6/uL (ref 3.70–5.45)
RDW: 17.1 % — AB (ref 11.2–14.5)
WBC: 3.8 10*3/uL — ABNORMAL LOW (ref 3.9–10.3)

## 2014-08-11 NOTE — Telephone Encounter (Signed)
Per staff message and POF I have scheduled appts. Advised scheduler of appts. JMW  

## 2014-08-11 NOTE — Progress Notes (Signed)
Rhonda [name]  [phone] [phone]     ID: Rhonda [name] OB: 1971-03-11  MR#: [id number]  HUD#:[id number]  PCP: Tereasa Coop, PA-C GYN:  Mora Bellman, MD SU: Excell Seltzer, MD OTHER MD: Arloa Koh, MD  CHIEF COMPLAINT: Right breast cancer CURRENT TREATMENT: neoadjuvant chemotherapy  BREAST CANCER HISTORY: From the original intake note:  Rhonda [name] was visiting relatives in [place] about 4 months ago when she noted pain in her right breast. In 2003 she had a right breast infection which felt pretty much the same. She was treated with antibiotics back then (while living in [place]). Accordingly, this time she minimized the symptoms. It felt like milk was coming down she says. She also had breast pain while having her period. Then in February 2015, she actually found 2 lumps in her right breast breast which she initially ignored. Eventually as the symptoms seemed to progress she saw a Dr. In [place] and he told her she likely had breast cancer. She waited until coming back to the states to seek definitive care.  On 12/17/2013 she had bilateral diagnostic mammography in [place] [facility]). In the upper outer right breast there were pleomorphic calcifications measuring approximately 9 cm in diameter. There were 4 distinct microlobulated masses in the right breast, 3 of them in the upper outer quadrant one in the lower outer quadrant. Each measures approximately 1.2 cm. Ultrasound showed a complex cystic mass in the superior central right breast, a 4 mm hypoechoic lesion, 3 suspicious solid masses elsewhere in the right breast, all of them hypoechoic and oval are round with increased vascularity. The largest of these masses measure 1.7 cm. Findings in the left breast were not suspicious or specific. The patient was referred to the breast Center where on 01/20/2014 she underwent biopsy of 2 of the right breast masses, and a right axillary lymph node.  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  On 01/28/2014 the patient underwent bilateral breast MRI. This showed, in the right breast, multiple irregular enhancing masses in the setting of clumped non-masslike enhancement the area in question measured up to 9.4 cm. There was diffuse asymmetric skin thickening and edema involving the right breast, but no obvious nipple or pectoralis involvement. There were several enlarged and morphologically abnormal right axillary lymph node as well as inter-pectoral and subpectoral adenopathy there the largest right axillary lymph node measured 2.7 cm. There was no internal mammary lymphadenopathy noted. The left breast was unremarkable.  Her subsequent history is as detailed below.  INTERVAL HISTORY: Rhonda [name] returns today for follow up of her breast cancer. During her last visit in this office she agreed to restart trastuzumab alone. She stopped chemotherapy earlier this summer because she did not tolerated her first 2 cycles, then she spent some time in [place] visiting family. She was to start trastuzumab back in September, but there was a scheduling conflict. She was scheduled to restart today, but she has not gotten an echocardiogram and the infusion appointment was too late and would cause her to miss picking her children up from school. The interval history is unremarkable.   REVIEW OF SYSTEMS: Rhonda [name] denies fevers, chills, nausea, vomiting, or changes in bowel or bladder habits. She denies headaches, dizziness, unexplained weight loss or bleeding. She has no shortness of breath, chest pain, palpitations, cough, or fatigue. She has  no evidence of peripheral neuropathy. A detailed review of systems was otherwise noncontributory.   PAST MEDICAL HISTORY: Past Medical History  Diagnosis Date  .  Hypertension   . Blood transfusion without reported diagnosis     3 previous   . Anemia   . Ulcer     hx of stomach ulcer a long time ago  . GERD (gastroesophageal reflux disease)     a long time ago  . Headache(784.0)   . Cancer 10/2013    right breast cancer  . Anxiety     PAST SURGICAL HISTORY: Past Surgical History  Procedure Laterality Date  . Cesarean section      2 previous  . Back surgery  2000  . Appendectomy      a long time ago in [place]  . Portacath placement N/A 02/06/2014    Procedure: INSERTION PORT-A-CATH;  Surgeon: Edward Jolly, MD;  Location: [facility];  Service: General;  Laterality: N/A;  . Port-a-cath removal Left 03/31/2014    Procedure: REMOVAL PORT-A-CATH;  Surgeon: Edward Jolly, MD;  Location: [facility];  Service: General;  Laterality: Left;  . Portacath placement Right 04/14/2014    Procedure: PLACEMENT OF PORT-A-CATH;  Surgeon: Edward Jolly, MD;  Location: [facility];  Service: General;  Laterality: Right;    FAMILY HISTORY Family History  Problem Relation Age of Onset  . Hypertension Mother   . Hypertension Father    the patient's parents are living but she is not sure of their age. She had 4 brothers and 4 sisters. 2 other brothers died in the [place] and one brother is currently in [place] but they do not know where are even if he is still alive. She has one brother in this area. There is no history of breast or ovarian cancer in the family to her knowledge.  GYNECOLOGIC HISTORY: (Reviewed 03/18/2014) Menarche somewhere between the ages of 2 and 54. First live birth age 6. The patient is GX P3. Her periods are now irregular. She took birth control pills remotely for approximately one year with no complaints.  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 Her husband Rhonda [name] is a former Emergency planning/management officer to the [place] from [place] and is a Optometrist. Their children are  Rhonda [name] (11),Rhonda [name] (8) and Rhonda [name] (4).  The youngest child is currently living with Rhonda [name]. The 2 older children are currently in [place]. The patient attends a [employer] locally    ADVANCED DIRECTIVES: Not in place   HEALTH MAINTENANCE: (Updated 03/18/2014) History  Substance Use Topics  . Smoking status: Never Smoker   . Smokeless tobacco: Never Used  . Alcohol Use: No     Colonoscopy: Never  PAP: April 2015/Dr. Constant  Bone density: Never  Lipid panel: Not on file  No Known Allergies  Current Outpatient Prescriptions  Medication Sig Dispense Refill  . baclofen (LIORESAL) 10 MG tablet Take 10 mg by mouth every morning.     . cholecalciferol (VITAMIN D) 1000 UNITS tablet Take 1,000 Units by mouth daily.    . hydrochlorothiazide (MICROZIDE) 12.5 MG capsule Take 12.5 mg by mouth daily as needed (headache).     Marland Kitchen omeprazole (PRILOSEC) 40 MG capsule Take 1 capsule (40 mg total) by mouth daily. 30 capsule 6  . acetaminophen (TYLENOL) 325 MG tablet Take 650 mg by mouth every 6 (six) hours as needed for mild pain or headache.     Marland Kitchen  Alum & Mag Hydroxide-Simeth (MAGIC MOUTHWASH W/LIDOCAINE) SOLN Take 5 mLs by mouth 4 (four) times daily as needed for mouth pain. 240 mL 1  . dexamethasone (DECADRON) 4 MG tablet Take 8 mg by mouth 2 (two) times daily. Take for three days after chemo    . diazepam (VALIUM) 5 MG tablet Take 1 tablet (5 mg total) by mouth 2 (two) times daily. 10 tablet 0  . HYDROcodone-acetaminophen (NORCO/VICODIN) 5-325 MG per tablet Take 1-2 tablets by mouth every 4 (four) hours as needed (pain.).    Marland Kitchen lidocaine-prilocaine (EMLA) cream Apply to port 1 -2 hr before each procedure/port access as directed 30 g 6  . loratadine (CLARITIN) 10 MG tablet Take 10 mg by mouth daily.    . meclizine (ANTIVERT) 25 MG tablet Take 1 tablet (25 mg total) by mouth 3 (three) times daily as needed. 30 tablet 0  . ondansetron (ZOFRAN) 8 MG tablet Take 8 mg by mouth 2 (two)  times daily as needed for nausea or vomiting (start the third day after chemo).    . prochlorperazine (COMPAZINE) 10 MG tablet Take 10 mg by mouth every 6 (six) hours as needed for nausea or vomiting (Take for three days after chemo).     No current facility-administered medications for this visit.    OBJECTIVE:    Filed Vitals:   08/11/14 1156  BP: 130/48  Pulse: 59  Temp: 98.6 F (37 C)  Resp: 18   Filed Weights   08/11/14 1156  Weight: 190 lb 1.6 oz (86.229 kg)    middle-aged African woman who appears as stated age   Sclerae unicteric, pupils equal and reactive Oropharynx clear and moist-- no thrush No cervical or supraclavicular adenopathy Lungs no rales or rhonchi Heart regular rate and rhythm Abd soft, nontender, positive bowel sounds MSK no focal spinal tenderness, no upper extremity lymphedema Neuro: nonfocal, well oriented, appropriate affect Breasts: deferred  LAB RESULTS:   Lab Results  Component Value Date   WBC 3.8* 08/11/2014   NEUTROABS 1.3* 08/11/2014   HGB 11.3* 08/11/2014   HCT 35.3 08/11/2014   MCV 67.2* 08/11/2014   PLT 208 08/11/2014      Chemistry      Component Value Date/Time   NA 140 08/11/2014 1118   NA 141 04/09/2014 1829   K 4.4 08/11/2014 1118   K 4.5 04/09/2014 1829   CL 103 04/09/2014 1829   CO2 26 08/11/2014 1118   CO2 27 04/09/2014 1829   BUN 10.8 08/11/2014 1118   BUN 17 04/09/2014 1829   CREATININE 0.7 08/11/2014 1118   CREATININE 0.78 04/09/2014 1829      Component Value Date/Time   CALCIUM 9.5 08/11/2014 1118   CALCIUM 9.4 04/09/2014 1829   ALKPHOS 91 08/11/2014 1118   ALKPHOS 83 02/17/2014 1527   AST 29 08/11/2014 1118   AST 14 02/17/2014 1527   ALT 26 08/11/2014 1118   ALT 19 02/17/2014 1527   BILITOT 0.36 08/11/2014 1118   BILITOT 1.2 02/17/2014 1527      STUDIES: Most recent echocardiogram on 04/10/14 showed an EF of 55-60%  ASSESSMENT: 43 y.o. [place] woman [place]  (1)    status post right breast and right axillary lymph node biopsy 01/20/2014, both positive for a clinically T3 N1-2, clinical stage III a invasive ductal carcinoma, grade 2, estrogen and progesterone receptor negative, HER-2 positive, with an MIB-1 of 85%  (2)  treated neoadjuvantly with 4 dose dense cycles of  doxorubicin/cyclophosphamide completed 03/25/2014, to be followed by weekly paclitaxel/ carboplatin x12, given along with trastuzumab/ pertuzumab every 3 weeks prior to definitive surgery.   (a) carboplatin was held with the second cycle because of poor tolerance  (b) day 1 cycle 3 (paclitaxel only) delayed one week at patient's request  (3)  Patient will likely need a right modified radical mastectomy, followed by postmastectomy radiation.   (4)  trastuzumab will be continued every 3 weeks for total of one year; most recent echocardiogram, 04/10/2014, showed a well preserved ejection fraction  PLAN: The labs were reviewed in detail and her Velva is borderline low at 1.3. We will continue to monitor this value. We would have proceeded with her trastuzumab today, however as discussed above, she is overdue for her echocardiogram and the infusion time is too late in the afternoon. I have written orders for this procedure to be performed this week and she will reschedule her trastuzumab for next week.   Meiah will return in 3 weeks for labs, an office visit, and her next dose of trastuzumab. She understands and agrees with this plan. She has been encouraged to call with any issues that might arise before her next visit here.   Marcelino Duster, NP

## 2014-08-12 ENCOUNTER — Telehealth: Payer: Self-pay | Admitting: Nurse Practitioner

## 2014-08-12 ENCOUNTER — Telehealth: Payer: Self-pay | Admitting: *Deleted

## 2014-08-12 NOTE — Telephone Encounter (Signed)
Per staff message and POF I have scheduled appts. Advised scheduler of appts. JMW  

## 2014-08-12 NOTE — Telephone Encounter (Signed)
, °

## 2014-08-15 ENCOUNTER — Other Ambulatory Visit: Payer: Self-pay | Admitting: Oncology

## 2014-08-15 ENCOUNTER — Encounter (HOSPITAL_COMMUNITY): Payer: Self-pay | Admitting: *Deleted

## 2014-08-15 ENCOUNTER — Other Ambulatory Visit (INDEPENDENT_AMBULATORY_CARE_PROVIDER_SITE_OTHER): Payer: Self-pay | Admitting: General Surgery

## 2014-08-15 NOTE — Progress Notes (Signed)
Please put orders in Epic for surgery tomorrow Saturday, 08-16-14 as a same day [employer]

## 2014-08-15 NOTE — Progress Notes (Signed)
Called triage nurse to contact Dr. Excell Seltzer for orders

## 2014-08-16 ENCOUNTER — Ambulatory Visit (HOSPITAL_COMMUNITY): Payer: Medicaid Other | Admitting: Anesthesiology

## 2014-08-16 ENCOUNTER — Encounter (HOSPITAL_COMMUNITY): Payer: Self-pay | Admitting: *Deleted

## 2014-08-16 ENCOUNTER — Ambulatory Visit (HOSPITAL_COMMUNITY)
Admission: RE | Admit: 2014-08-16 | Discharge: 2014-08-16 | Disposition: A | Discharge status: Home / Self Care | Payer: Medicaid Other | Source: Ambulatory Visit | Attending: General Surgery | Admitting: General Surgery

## 2014-08-16 ENCOUNTER — Encounter (HOSPITAL_COMMUNITY)
Admission: RE | Disposition: A | Discharge status: Home / Self Care | Payer: Self-pay | Source: Ambulatory Visit | Attending: General Surgery

## 2014-08-16 DIAGNOSIS — Z95828 Presence of other vascular implants and grafts: Secondary | ICD-10-CM

## 2014-08-16 DIAGNOSIS — Z452 Encounter for adjustment and management of vascular access device: Secondary | ICD-10-CM | POA: Insufficient documentation

## 2014-08-16 DIAGNOSIS — C50911 Malignant neoplasm of unspecified site of right female breast: Secondary | ICD-10-CM | POA: Diagnosis not present

## 2014-08-16 DIAGNOSIS — Z170 Estrogen receptor positive status [ER+]: Secondary | ICD-10-CM | POA: Diagnosis not present

## 2014-08-16 HISTORY — PX: PORT-A-CATH REMOVAL: SHX5289

## 2014-08-16 SURGERY — REMOVAL PORT-A-CATH
Anesthesia: Monitor Anesthesia Care | Site: Chest

## 2014-08-16 MED ORDER — SODIUM BICARBONATE 4 % IV SOLN
INTRAVENOUS | Status: DC | PRN
  Administered 2014-08-16: 1 mL

## 2014-08-16 MED ORDER — LIDOCAINE HCL 1 % IJ SOLN
INTRAMUSCULAR | Status: DC | PRN
  Administered 2014-08-16: 3 mL

## 2014-08-16 MED ORDER — ONDANSETRON HCL 4 MG/2ML IJ SOLN
INTRAMUSCULAR | Status: AC
  Filled 2014-08-16: qty 2

## 2014-08-16 MED ORDER — PROPOFOL 10 MG/ML IV BOLUS
INTRAVENOUS | Status: DC | PRN
  Administered 2014-08-16: 50 mg via INTRAVENOUS

## 2014-08-16 MED ORDER — 0.9 % SODIUM CHLORIDE (POUR BTL) OPTIME
TOPICAL | Status: DC | PRN
  Administered 2014-08-16: 1000 mL

## 2014-08-16 MED ORDER — MIDAZOLAM HCL 2 MG/2ML IJ SOLN
INTRAMUSCULAR | Status: AC
  Filled 2014-08-16: qty 2

## 2014-08-16 MED ORDER — LIDOCAINE HCL 1 % IJ SOLN
INTRAMUSCULAR | Status: AC
  Filled 2014-08-16: qty 20

## 2014-08-16 MED ORDER — PROPOFOL 10 MG/ML IV BOLUS
INTRAVENOUS | Status: AC
  Filled 2014-08-16: qty 20

## 2014-08-16 MED ORDER — BUPIVACAINE-EPINEPHRINE (PF) 0.25% -1:200000 IJ SOLN
INTRAMUSCULAR | Status: AC
  Filled 2014-08-16: qty 30

## 2014-08-16 MED ORDER — MEPERIDINE HCL 50 MG/ML IJ SOLN: 6.2500 mg | INTRAMUSCULAR | Status: DC | PRN

## 2014-08-16 MED ORDER — OXYCODONE HCL 5 MG PO TABS
ORAL_TABLET | ORAL | Status: AC
  Filled 2014-08-16: qty 1

## 2014-08-16 MED ORDER — MIDAZOLAM HCL 5 MG/5ML IJ SOLN
INTRAMUSCULAR | Status: DC | PRN
  Administered 2014-08-16: 2 mg via INTRAVENOUS

## 2014-08-16 MED ORDER — ONDANSETRON HCL 4 MG/2ML IJ SOLN
INTRAMUSCULAR | Status: DC | PRN
  Administered 2014-08-16: 4 mg via INTRAVENOUS

## 2014-08-16 MED ORDER — SODIUM BICARBONATE 4 % IV SOLN
INTRAVENOUS | Status: AC
  Filled 2014-08-16: qty 5

## 2014-08-16 MED ORDER — PROPOFOL 10 MG/ML IV BOLUS
INTRAVENOUS | Status: AC
Start: 2014-08-16 — End: 2014-08-16
  Filled 2014-08-16: qty 20

## 2014-08-16 MED ORDER — METOCLOPRAMIDE HCL 5 MG/ML IJ SOLN: 10.0000 mg | Freq: Once | INTRAMUSCULAR | Status: DC | PRN

## 2014-08-16 MED ORDER — HYDROCODONE-ACETAMINOPHEN 5-325 MG PO TABS: 1.0000 | ORAL_TABLET | ORAL | Status: DC | PRN

## 2014-08-16 MED ORDER — FENTANYL CITRATE 0.05 MG/ML IJ SOLN
INTRAMUSCULAR | Status: AC
  Filled 2014-08-16: qty 2

## 2014-08-16 MED ORDER — FENTANYL CITRATE 0.05 MG/ML IJ SOLN
INTRAMUSCULAR | Status: DC | PRN
  Administered 2014-08-16: 25 ug via INTRAVENOUS

## 2014-08-16 MED ORDER — OXYCODONE HCL 5 MG/5ML PO SOLN
5.0000 mg | Freq: Once | ORAL | Status: AC | PRN
  Filled 2014-08-16: qty 5

## 2014-08-16 MED ORDER — LIDOCAINE HCL (CARDIAC) 20 MG/ML IV SOLN
INTRAVENOUS | Status: DC | PRN
  Administered 2014-08-16: 50 mg via INTRAVENOUS

## 2014-08-16 MED ORDER — LACTATED RINGERS IV SOLN: INTRAVENOUS | Status: DC

## 2014-08-16 MED ORDER — FENTANYL CITRATE 0.05 MG/ML IJ SOLN: 25.0000 ug | INTRAMUSCULAR | Status: DC | PRN

## 2014-08-16 MED ORDER — PROPOFOL INFUSION 10 MG/ML OPTIME
INTRAVENOUS | Status: DC | PRN
  Administered 2014-08-16: 100 ug/kg/min via INTRAVENOUS

## 2014-08-16 MED ORDER — BUPIVACAINE-EPINEPHRINE 0.25% -1:200000 IJ SOLN
INTRAMUSCULAR | Status: DC | PRN
  Administered 2014-08-16: 4 mL

## 2014-08-16 MED ORDER — LACTATED RINGERS IV SOLN
INTRAVENOUS | Status: DC | PRN
  Administered 2014-08-16: 07:00:00 via INTRAVENOUS

## 2014-08-16 MED ORDER — LIDOCAINE HCL (CARDIAC) 20 MG/ML IV SOLN
INTRAVENOUS | Status: AC
  Filled 2014-08-16: qty 5

## 2014-08-16 MED ORDER — OXYCODONE HCL 5 MG PO TABS
5.0000 mg | ORAL_TABLET | Freq: Once | ORAL | Status: AC | PRN
  Administered 2014-08-16: 5 mg via ORAL

## 2014-08-16 SURGICAL SUPPLY — 30 items
BENZOIN TINCTURE PRP APPL 2/3 (GAUZE/BANDAGES/DRESSINGS) IMPLANT
BLADE HEX COATED 2.75 (ELECTRODE) ×3 IMPLANT
BLADE SURG 15 STRL LF DISP TIS (BLADE) ×1 IMPLANT
BLADE SURG 15 STRL SS (BLADE) ×2
CLOSURE WOUND 1/2 X4 (GAUZE/BANDAGES/DRESSINGS)
DECANTER SPIKE VIAL GLASS SM (MISCELLANEOUS) ×3 IMPLANT
DRAPE LAPAROTOMY TRNSV 102X78 (DRAPE) ×3 IMPLANT
DRSG TEGADERM 4X4.75 (GAUZE/BANDAGES/DRESSINGS) IMPLANT
ELECT REM PT RETURN 9FT ADLT (ELECTROSURGICAL) ×3
ELECTRODE REM PT RTRN 9FT ADLT (ELECTROSURGICAL) ×1 IMPLANT
GAUZE SPONGE 4X4 12PLY STRL (GAUZE/BANDAGES/DRESSINGS) ×3 IMPLANT
GAUZE SPONGE 4X4 16PLY XRAY LF (GAUZE/BANDAGES/DRESSINGS) ×3 IMPLANT
GLOVE BIOGEL PI IND STRL 7.0 (GLOVE) ×1 IMPLANT
GLOVE BIOGEL PI INDICATOR 7.0 (GLOVE) ×2
GOWN STRL REUS W/TWL LRG LVL3 (GOWN DISPOSABLE) ×3 IMPLANT
GOWN STRL REUS W/TWL XL LVL3 (GOWN DISPOSABLE) ×6 IMPLANT
KIT BASIN OR (CUSTOM PROCEDURE TRAY) ×3 IMPLANT
LIQUID BAND (GAUZE/BANDAGES/DRESSINGS) ×3 IMPLANT
NEEDLE HYPO 22GX1.5 SAFETY (NEEDLE) ×3 IMPLANT
NEEDLE HYPO 25X1 1.5 SAFETY (NEEDLE) ×3 IMPLANT
NS IRRIG 1000ML POUR BTL (IV SOLUTION) ×3 IMPLANT
PACK BASIC VI WITH GOWN DISP (CUSTOM PROCEDURE TRAY) ×3 IMPLANT
PENCIL BUTTON HOLSTER BLD 10FT (ELECTRODE) ×3 IMPLANT
STRIP CLOSURE SKIN 1/2X4 (GAUZE/BANDAGES/DRESSINGS) IMPLANT
SUT MNCRL AB 4-0 PS2 18 (SUTURE) ×3 IMPLANT
SYR BULB IRRIGATION 50ML (SYRINGE) ×3 IMPLANT
SYR CONTROL 10ML LL (SYRINGE) ×3 IMPLANT
TOWEL OR 17X26 10 PK STRL BLUE (TOWEL DISPOSABLE) ×3 IMPLANT
TOWEL OR NON WOVEN STRL DISP B (DISPOSABLE) ×3 IMPLANT
YANKAUER SUCT BULB TIP NO VENT (SUCTIONS) ×3 IMPLANT

## 2014-08-16 NOTE — Anesthesia Preprocedure Evaluation (Signed)
Anesthesia Evaluation  Patient identified by MRN, date of birth, ID band Patient awake    Reviewed: Allergy & Precautions, H&P , NPO status , Patient's Chart, lab work & pertinent test results  Airway Mallampati: II  TM Distance: >3 FB Neck ROM: Full    Dental  (+) Missing   Pulmonary neg pulmonary ROS,  breath sounds clear to auscultation  Pulmonary exam normal       Cardiovascular hypertension, negative cardio ROS  Rhythm:Regular Rate:Normal     Neuro/Psych  Headaches, Anxiety    GI/Hepatic Neg liver ROS, PUD, GERD-  Medicated and Controlled,  Endo/Other  Hx/o Breast Ca  Renal/GU negative Renal ROS  negative genitourinary   Musculoskeletal negative musculoskeletal ROS (+)   Abdominal   Peds  Hematology  (+) anemia ,   Anesthesia Other Findings   Reproductive/Obstetrics negative OB ROS                             Anesthesia Physical Anesthesia Plan  ASA: II  Anesthesia Plan: MAC   Post-op Pain Management:    Induction: Intravenous  Airway Management Planned: Simple Face Mask and Nasal Cannula  Additional Equipment:   Intra-op Plan:   Post-operative Plan:   Informed Consent: I have reviewed the patients History and Physical, chart, labs and discussed the procedure including the risks, benefits and alternatives for the proposed anesthesia with the patient or authorized representative who has indicated his/her understanding and acceptance.     Plan Discussed with: CRNA, Anesthesiologist and Surgeon  Anesthesia Plan Comments:         Anesthesia Quick Evaluation

## 2014-08-16 NOTE — Discharge Instructions (Signed)
Leave incision dry for 24 hours, then may shower

## 2014-08-16 NOTE — Anesthesia Postprocedure Evaluation (Signed)
  Anesthesia Post-op Note  Patient: Rhonda [name]  Procedure(s) Performed: Procedure(s): REMOVAL PORT-A-CATH (N/A)  Patient Location: PACU  Anesthesia Type:General  Level of Consciousness: awake, alert  and oriented  Airway and Oxygen Therapy: Patient Spontanous Breathing  Post-op Pain: none  Post-op Assessment: Post-op Vital signs reviewed, Patient's Cardiovascular Status Stable, Respiratory Function Stable, Patent Airway, No signs of Nausea or vomiting and Pain level controlled  Post-op Vital Signs: Reviewed and stable  Last Vitals:  Filed Vitals:   08/16/14 0835  BP:   Pulse: 58  Temp:   Resp: 16    Complications: No apparent anesthesia complications

## 2014-08-16 NOTE — Transfer of Care (Signed)
Immediate Anesthesia Transfer of Care Note  Patient: Rhonda [name]  Procedure(s) Performed: Procedure(s) (LRB): REMOVAL PORT-A-CATH (N/A)  Patient Location: PACU  Anesthesia Type: MAC  Level of Consciousness: sedated, patient cooperative and responds to stimulation  Airway & Oxygen Therapy: Patient Spontanous Breathing and Patient connected to face mask oxgen  Post-op Assessment: Report given to PACU RN and Post -op Vital signs reviewed and stable  Post vital signs: Reviewed and stable  Complications: No apparent anesthesia complications

## 2014-08-16 NOTE — Op Note (Signed)
Pre op diagnosis: status post Port-A-Cath, unused and non functioning  Postop diagnosis: Same  Surgical procedure: Removal of Port-A-Cath  Surgeon: Excell Seltzer  Anesthesia: Local with IV sedation  Description of procedure: The patient is positioned supine and the anterior chest sterilely prepped and draped.Patient and procedure were verified. Local anesthesia was used to infiltrate the skin and underlying soft tissue. The previous incision was opened and sharp dissection carried down onto the port and catheter. The catheter was withdrawn intact. The port was sharply dissected out of the subcutaneous tissue and removed with all suture material. The subcutaneous tissue was closed with interrupted 4-0 Monocryl and the skin was closed with subcuticular 4-0 Monocryl and Dermabond. Patient tolerated the procedure well.   Edward Jolly MD, FACS  08/16/2014

## 2014-08-16 NOTE — H&P (Signed)
  History of Present Illness Rhonda [name] T. Hoxworth MD; 08/15/2014 12:39 PM) Patient words: Discuss removing PAC and no replacement Still having chemo tx's.  The patient is a 43 year old female who presents with breast cancer. She returns for follow-up and specifically due to problems with her Port-A-Cath. She has a history of locally advanced breast cancer diagnosed in the spring,clinically T3 N1-2, clinical stage III a invasive ductal carcinoma, grade 2, estrogen and progesterone receptor negative, HER-2 positive, with an MIB-1 of 85%. She had a Port-A-Cath placed on the left and developed DVT of her left arm and this was removed and I replaced a Port-A-Cath in the right IJ vein. She did not tolerate chemotherapy due to side effects and stopped this after 2 doses. She did receive trastuzumab but left for [place] for at least 2 months in late summer and had no therapy or flushing of her Port-A-Cath during that time. She recently returned and apparently her port does not aspirate. She recently presented with pain around the port site and in her right neck and states there was swelling. The swelling and pain have gotten a little bit better but it continues to hurt her. She wants the port removed. She states she is willing to try the therapy through peripheral access but does not want the port and she is worried about another blood clot understandably. She has not had imaging. I discussed this with Dr. Jana Hakim and we discussed that I will remove her port and her therapy can be tried peripherally.   Past Surgical History Lars Mage [place], [place]; 08/15/2014 11:52 AM) Breast Biopsy Right. Cesarean Section - 1  Allergies (Alisha Spillers, MA; 08/15/2014 11:56 AM) No Known Drug Allergies11/20/2015  Vitals (Alisha Spillers MA; 08/15/2014 11:55 AM) 08/15/2014 11:53 AM Weight: 198 lb Height: 70in Body Surface Area: 2.11 m Body Mass Index: 28.41 kg/m Pulse: 66 (Regular)  BP: 114/70  (Sitting, Left Arm, Standard)    Physical Exam Rhonda [name] T. Hoxworth MD; 08/15/2014 12:40 PM) The physical exam findings are as follows: Note:General: Appears well Skin: No rash or infection Lymph nodes: No cervical supra clavicular or axillary nodes palpable HEENT: Catheter in right neck. Mild tenderness but no swelling. Chest wall: Port site looks fine Breasts: There may be a little thickening in the outer right breast but I do not feel a definite mass and again no axillary adenopathy Lungs: Clear equal breath sounds bilaterally    Assessment & Plan Rhonda [name] T. Hoxworth MD; 08/15/2014 12:43 PM) PORT-A-CATH IN PLACE (B70.48  G89.169) Impression: She will not accept anything less than Port-A-Cath removal and I don't think this is unreasonable. We discussed options of imaging to see if there is a plug that can be lysed and save the port but she just wants it removed. Plan to proceed with this tomorrow under local anesthesia. BREAST CANCER, RIGHT (174.9  C50.911) Impression: Locally advanced cancer right breast, T3 N1-2. Did not tolerate chemotherapy. Is willing to try trastuzumab. She will need follow-up MRI and surgical treatment planning in the near future and need to proceed if she is not going to allow anymore therapy. Likely will need modified mastectomy but I think it is reasonable to reimage with MRI preoperatively. Discussed with Dr. Jana Hakim

## 2014-08-16 NOTE — Interval H&P Note (Signed)
History and Physical Interval Note:  08/16/2014 7:13 AM  Rhonda [name]  has presented today for surgery, with the diagnosis of unused port  The various methods of treatment have been discussed with the patient and family. After consideration of risks, benefits and other options for treatment, the patient has consented to  Procedure(s): REMOVAL PORT-A-CATH (N/A) as a surgical intervention .  The patient's history has been reviewed, patient examined, no change in status, stable for surgery.  I have reviewed the patient's chart and labs.  Questions were answered to the patient's satisfaction.     Shadrack Brummitt T

## 2014-08-18 ENCOUNTER — Encounter (HOSPITAL_COMMUNITY): Payer: Self-pay | Admitting: General Surgery

## 2014-08-19 ENCOUNTER — Other Ambulatory Visit: Payer: Self-pay | Admitting: Nurse Practitioner

## 2014-08-19 ENCOUNTER — Other Ambulatory Visit: Payer: Self-pay | Admitting: Oncology

## 2014-08-19 ENCOUNTER — Ambulatory Visit (HOSPITAL_BASED_OUTPATIENT_CLINIC_OR_DEPARTMENT_OTHER): Payer: Medicaid Other

## 2014-08-19 ENCOUNTER — Ambulatory Visit (HOSPITAL_COMMUNITY)
Admission: RE | Admit: 2014-08-19 | Discharge: 2014-08-19 | Disposition: A | Discharge status: Home / Self Care | Payer: Medicaid Other | Source: Ambulatory Visit | Attending: Nurse Practitioner | Admitting: Nurse Practitioner

## 2014-08-19 DIAGNOSIS — C50811 Malignant neoplasm of overlapping sites of right female breast: Secondary | ICD-10-CM

## 2014-08-19 DIAGNOSIS — Z01818 Encounter for other preprocedural examination: Secondary | ICD-10-CM | POA: Insufficient documentation

## 2014-08-19 DIAGNOSIS — C50411 Malignant neoplasm of upper-outer quadrant of right female breast: Secondary | ICD-10-CM

## 2014-08-19 DIAGNOSIS — Z5111 Encounter for antineoplastic chemotherapy: Secondary | ICD-10-CM

## 2014-08-19 DIAGNOSIS — C773 Secondary and unspecified malignant neoplasm of axilla and upper limb lymph nodes: Secondary | ICD-10-CM

## 2014-08-19 DIAGNOSIS — Z5112 Encounter for antineoplastic immunotherapy: Secondary | ICD-10-CM

## 2014-08-19 MED ORDER — SODIUM CHLORIDE 0.9 % IV SOLN
Freq: Once | INTRAVENOUS | Status: AC
  Administered 2014-08-19: 12:00:00 via INTRAVENOUS

## 2014-08-19 MED ORDER — TRASTUZUMAB CHEMO INJECTION 440 MG
8.0000 mg/kg | Freq: Once | INTRAVENOUS | Status: AC
  Administered 2014-08-19: 672 mg via INTRAVENOUS
  Filled 2014-08-19: qty 32

## 2014-08-19 MED ORDER — DIPHENHYDRAMINE HCL 25 MG PO CAPS
ORAL_CAPSULE | ORAL | Status: AC
  Filled 2014-08-19: qty 1

## 2014-08-19 MED ORDER — DIPHENHYDRAMINE HCL 25 MG PO CAPS
25.0000 mg | ORAL_CAPSULE | Freq: Once | ORAL | Status: AC
  Administered 2014-08-19: 25 mg via ORAL

## 2014-08-19 MED ORDER — ACETAMINOPHEN 325 MG PO TABS
650.0000 mg | ORAL_TABLET | Freq: Once | ORAL | Status: AC
  Administered 2014-08-19: 650 mg via ORAL

## 2014-08-19 MED ORDER — ACETAMINOPHEN 325 MG PO TABS
ORAL_TABLET | ORAL | Status: AC
  Filled 2014-08-19: qty 2

## 2014-08-19 NOTE — Patient Instructions (Signed)
[facility] Discharge Instructions for Patients Receiving Chemotherapy  Today you received the following chemotherapy agents Herceptin  To help prevent nausea and vomiting after your treatment, we encourage you to take your nausea medication     If you develop nausea and vomiting that is not controlled by your nausea medication, call the clinic.   BELOW ARE SYMPTOMS THAT SHOULD BE REPORTED IMMEDIATELY:  *FEVER GREATER THAN 100.5 F  *CHILLS WITH OR WITHOUT FEVER  NAUSEA AND VOMITING THAT IS NOT CONTROLLED WITH YOUR NAUSEA MEDICATION  *UNUSUAL SHORTNESS OF BREATH  *UNUSUAL BRUISING OR BLEEDING  TENDERNESS IN MOUTH AND THROAT WITH OR WITHOUT PRESENCE OF ULCERS  *URINARY PROBLEMS  *BOWEL PROBLEMS  UNUSUAL RASH Items with * indicate a potential emergency and should be followed up as soon as possible.  Feel free to call the clinic you have any questions or concerns. The clinic phone number is [phone].    

## 2014-08-19 NOTE — Progress Notes (Signed)
Echocardiogram 2D Echocardiogram has been performed.  Rhonda [name] 08/19/2014, 9:25 AM

## 2014-09-08 ENCOUNTER — Telehealth: Payer: Self-pay | Admitting: *Deleted

## 2014-09-08 ENCOUNTER — Other Ambulatory Visit: Payer: Medicaid Other

## 2014-09-08 ENCOUNTER — Ambulatory Visit: Payer: Medicaid Other

## 2014-09-08 ENCOUNTER — Ambulatory Visit: Payer: Medicaid Other | Admitting: Nurse Practitioner

## 2014-09-08 ENCOUNTER — Telehealth: Payer: Self-pay | Admitting: Nurse Practitioner

## 2014-09-08 NOTE — Telephone Encounter (Signed)
, °

## 2014-09-08 NOTE — Telephone Encounter (Signed)
Called pt on both numbers listed and left messages concerning missed appt today. No answer. Left message for pt to call this nurse back [phone]. Message to be forwarded to [facility].

## 2014-11-27 ENCOUNTER — Telehealth: Payer: Self-pay | Admitting: Nurse Practitioner

## 2014-11-27 NOTE — Telephone Encounter (Signed)
pt cld to sch appt-gave pt time & date

## 2014-12-01 ENCOUNTER — Encounter: Payer: Self-pay | Admitting: Nurse Practitioner

## 2014-12-01 ENCOUNTER — Ambulatory Visit (HOSPITAL_BASED_OUTPATIENT_CLINIC_OR_DEPARTMENT_OTHER): Payer: Medicaid Other | Admitting: Nurse Practitioner

## 2014-12-01 ENCOUNTER — Telehealth: Payer: Self-pay | Admitting: Oncology

## 2014-12-01 ENCOUNTER — Other Ambulatory Visit (HOSPITAL_BASED_OUTPATIENT_CLINIC_OR_DEPARTMENT_OTHER): Payer: Medicaid Other

## 2014-12-01 ENCOUNTER — Telehealth: Payer: Self-pay | Admitting: Nurse Practitioner

## 2014-12-01 VITALS — BP 133/83 | HR 66 | Temp 98.4°F | Resp 18 | Ht 70.0 in | Wt 191.3 lb

## 2014-12-01 DIAGNOSIS — C50811 Malignant neoplasm of overlapping sites of right female breast: Secondary | ICD-10-CM | POA: Diagnosis not present

## 2014-12-01 DIAGNOSIS — Z171 Estrogen receptor negative status [ER-]: Secondary | ICD-10-CM | POA: Diagnosis not present

## 2014-12-01 DIAGNOSIS — C50411 Malignant neoplasm of upper-outer quadrant of right female breast: Secondary | ICD-10-CM

## 2014-12-01 DIAGNOSIS — C773 Secondary and unspecified malignant neoplasm of axilla and upper limb lymph nodes: Secondary | ICD-10-CM

## 2014-12-01 LAB — CBC WITH DIFFERENTIAL/PLATELET
BASO%: 0.4 % (ref 0.0–2.0)
Basophils Absolute: 0 10*3/uL (ref 0.0–0.1)
EOS%: 1.1 % (ref 0.0–7.0)
Eosinophils Absolute: 0.1 10*3/uL (ref 0.0–0.5)
HCT: 36.2 % (ref 34.8–46.6)
HGB: 10.9 g/dL — ABNORMAL LOW (ref 11.6–15.9)
LYMPH#: 2.1 10*3/uL (ref 0.9–3.3)
LYMPH%: 41.1 % (ref 14.0–49.7)
MCH: 21.1 pg — ABNORMAL LOW (ref 25.1–34.0)
MCHC: 30.1 g/dL — AB (ref 31.5–36.0)
MCV: 70.2 fL — AB (ref 79.5–101.0)
MONO#: 0.6 10*3/uL (ref 0.1–0.9)
MONO%: 10.7 % (ref 0.0–14.0)
NEUT#: 2.4 10*3/uL (ref 1.5–6.5)
NEUT%: 46.7 % (ref 38.4–76.8)
Platelets: 222 10*3/uL (ref 145–400)
RBC: 5.16 10*6/uL (ref 3.70–5.45)
RDW: 17.4 % — ABNORMAL HIGH (ref 11.2–14.5)
WBC: 5.2 10*3/uL (ref 3.9–10.3)

## 2014-12-01 LAB — COMPREHENSIVE METABOLIC PANEL (CC13)
ALT: 17 U/L (ref 0–55)
AST: 24 U/L (ref 5–34)
Albumin: 3.4 g/dL — ABNORMAL LOW (ref 3.5–5.0)
Alkaline Phosphatase: 87 U/L (ref 40–150)
Anion Gap: 7 mEq/L (ref 3–11)
BUN: 13.7 mg/dL (ref 7.0–26.0)
CO2: 27 mEq/L (ref 22–29)
Calcium: 9.2 mg/dL (ref 8.4–10.4)
Chloride: 108 mEq/L (ref 98–109)
Creatinine: 0.7 mg/dL (ref 0.6–1.1)
GLUCOSE: 83 mg/dL (ref 70–140)
Potassium: 3.9 mEq/L (ref 3.5–5.1)
Sodium: 142 mEq/L (ref 136–145)
Total Bilirubin: 0.38 mg/dL (ref 0.20–1.20)
Total Protein: 7.4 g/dL (ref 6.4–8.3)

## 2014-12-01 NOTE — Telephone Encounter (Signed)
Pt also to see dr Excell Seltzer 3/24 at 11;15

## 2014-12-01 NOTE — Progress Notes (Signed)
[place]  [phone] [phone]     ID: Rhonda [name] OB: 11/16/1970  MR#: [id number]  HBZ#:[id number]  PCP: Tereasa Coop, PA-C GYN:  Mora Bellman, MD SU: Excell Seltzer, MD OTHER MD: Arloa Koh, MD  CHIEF COMPLAINT: Right breast cancer CURRENT TREATMENT: neoadjuvant chemotherapy  BREAST CANCER HISTORY: From the original intake note:  Rhonda [name] was visiting relatives in [place] about 4 months ago when she noted pain in her right breast. In 2003 she had a right breast infection which felt pretty much the same. She was treated with antibiotics back then (while living in [place]). Accordingly, this time she minimized the symptoms. It felt like milk was coming down she says. She also had breast pain while having her period. Then in February 2015, she actually found 2 lumps in her right breast breast which she initially ignored. Eventually as the symptoms seemed to progress she saw a Dr. In [place] and he told her she likely had breast cancer. She waited until [place] to seek definitive care.  On 12/17/2013 she had bilateral diagnostic mammography in [facility] [facility]). In the upper outer right breast there were pleomorphic calcifications measuring approximately 9 cm in diameter. There were 4 distinct microlobulated masses in the right breast, 3 of them in the upper outer quadrant one in the lower outer quadrant. Each measures approximately 1.2 cm. Ultrasound showed a complex cystic mass in the superior central right breast, a 4 mm hypoechoic lesion, 3 suspicious solid masses elsewhere in the right breast, all of them hypoechoic and oval are round with increased vascularity. The largest of these masses measure 1.7 cm. Findings in the left breast were not suspicious or specific. The patient was referred to the breast Center where on 01/20/2014 she underwent biopsy of 2 of the right breast masses, and a right axillary lymph node.  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  On 01/28/2014 the patient underwent bilateral breast MRI. This showed, in the right breast, multiple irregular enhancing masses in the setting of clumped non-masslike enhancement the area in question measured up to 9.4 cm. There was diffuse asymmetric skin thickening and edema involving the right breast, but no obvious nipple or pectoralis involvement. There were several enlarged and morphologically abnormal right axillary lymph node as well as inter-pectoral and subpectoral adenopathy there the largest right axillary lymph node measured 2.7 cm. There was no internal mammary lymphadenopathy noted. The left breast was unremarkable.  Her subsequent history is as detailed below.  INTERVAL HISTORY: Rhonda [name] returns today for follow up of her breast cancer. Again, during her last visit in this office she agreed to restart trastuzumab. She received one dose before her port site became painful and swollen. She wishes to proceed with peripheral treatment instead, and the port was removed on 08/16/14. Shortly after, she traveled to [place] for the holidays and is just returning. Her son has been diagnosed with a Chiari malformation and will need surgery.  REVIEW OF SYSTEMS: Rhonda [name] denies fevers, chills, nausea, vomiting, or changes in bowel or bladder habits. She denies headaches, dizziness, unexplained weight loss or bleeding. She has no shortness of breath, chest pain, palpitations, cough, or fatigue. She is eating and drinking well. She has no mouth sores, rashes, or peripheral neuropathy symptoms. A detailed review of systems was otherwise noncontributory.  PAST MEDICAL HISTORY: Past Medical History  Diagnosis Date  . Hypertension   . Blood transfusion without reported diagnosis     3 previous   . Anemia    . Ulcer     hx of stomach ulcer a long time ago  . GERD (gastroesophageal reflux disease)     a long time ago  . Headache(784.0)   . Cancer 10/2013    right breast cancer  . Anxiety     PAST SURGICAL HISTORY: Past Surgical History  Procedure Laterality Date  . Cesarean section      2 previous  . Back surgery  2000  . Appendectomy      a long time ago in [place]  . Portacath placement N/A 02/06/2014    Procedure: INSERTION PORT-A-CATH;  Surgeon: Edward Jolly, MD;  Location: [facility];  Service: General;  Laterality: N/A;  . Port-a-cath removal Left 03/31/2014    Procedure: REMOVAL PORT-A-CATH;  Surgeon: Edward Jolly, MD;  Location: [place] [facility];  Service: General;  Laterality: Left;  . Portacath placement Right 04/14/2014    Procedure: PLACEMENT OF PORT-A-CATH;  Surgeon: Edward Jolly, MD;  Location: [place];  Service: General;  Laterality: Right;  . Port-a-cath removal N/A 08/16/2014    Procedure: REMOVAL PORT-A-CATH;  Surgeon: Excell Seltzer, MD;  Location: [facility];  Service: General;  Laterality: N/A;    FAMILY HISTORY Family History  Problem Relation Age of Onset  . Hypertension Mother   . Hypertension Father    the patient's parents are living but she is not sure of their age. She had 4 brothers and 4 sisters. 2 other brothers died in the turmoil [place] and one brother is currently in [place] but they do not know where are even if he is still alive. She has one brother in this area. There is no history of breast or ovarian cancer in the family to her knowledge.  GYNECOLOGIC HISTORY: (Reviewed 03/18/2014) Menarche somewhere between the ages of 28 and 67. First live birth age 29. The patient is GX P3. Her periods are now irregular. She took birth control pills remotely for approximately one year with no complaints.  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 Her husband Rhonda [name] is a former  Emergency planning/management officer to the [place] from [place] and is a Optometrist. Their children are Rhonda [name] (11),Rhonda [name] (8) and Rhonda [name] (4).  The youngest child is currently living with Rhonda [name]. The 2 older children are currently in [place]. The patient attends a [employer] locally    ADVANCED DIRECTIVES: Not in place   HEALTH MAINTENANCE: (Updated 03/18/2014) History  Substance Use Topics  . Smoking status: Never Smoker   . Smokeless tobacco: Never Used  . Alcohol Use: No     Colonoscopy: Never  PAP: April 2015/Dr. Constant  Bone density: Never  Lipid panel: Not on file  No Known Allergies  Current Outpatient Prescriptions  Medication Sig Dispense Refill  . acetaminophen (TYLENOL) 325 MG tablet Take 650 mg by mouth every 6 (six) hours as needed for mild pain or headache.     . baclofen (LIORESAL) 10 MG tablet Take 10 mg by mouth every morning.     . chlorhexidine (PERIDEX) 0.12 % solution 10 mLs by Mouth Rinse route 2 (two) times daily. Swish and expectorate  0  . cholecalciferol (VITAMIN D) 1000 UNITS tablet Take 1,000 Units by mouth daily.    . diazepam (VALIUM)  5 MG tablet Take 1 tablet (5 mg total) by mouth 2 (two) times daily. (Patient not taking: Reported on 08/16/2014) 10 tablet 0  . hydrochlorothiazide (MICROZIDE) 12.5 MG capsule Take 12.5 mg by mouth daily as needed (headache).     . loratadine (CLARITIN) 10 MG tablet Take 10 mg by mouth daily.    . meclizine (ANTIVERT) 25 MG tablet Take 1 tablet (25 mg total) by mouth 3 (three) times daily as needed. (Patient not taking: Reported on 08/16/2014) 30 tablet 0  . omeprazole (PRILOSEC) 40 MG capsule Take 1 capsule (40 mg total) by mouth daily. (Patient not taking: Reported on 08/16/2014) 30 capsule 6   No current facility-administered medications for this visit.    OBJECTIVE:    Filed Vitals:   12/01/14 1426  BP: 133/83  Pulse: 66  Temp: 98.4 F (36.9 C)  Resp: 18   Filed Weights   12/01/14 1426  Weight: 191 lb 4.8 oz (86.773  kg)    middle-aged African woman who appears as stated age  Skin: warm, dry  HEENT: sclerae anicteric, conjunctivae pink, oropharynx clear. No thrush or mucositis.  Lymph Nodes: No cervical or supraclavicular lymphadenopathy  Lungs: clear to auscultation bilaterally, no rales, wheezes, or rhonci  Heart: regular rate and rhythm  Abdomen: round, soft, non tender, positive bowel sounds  Musculoskeletal: No focal spinal tenderness, no peripheral edema  Neuro: non focal, well oriented, positive affect  Breasts: thickness behind right nipple, but unable to locate well defined mass. No skin or nipple changes. Right axilla benign. Left breasts unremarkable.   LAB RESULTS:   Lab Results  Component Value Date   WBC 5.2 12/01/2014   NEUTROABS 2.4 12/01/2014   HGB 10.9* 12/01/2014   HCT 36.2 12/01/2014   MCV 70.2* 12/01/2014   PLT 222 12/01/2014      Chemistry      Component Value Date/Time   NA 142 12/01/2014 1409   NA 141 04/09/2014 1829   K 3.9 12/01/2014 1409   K 4.5 04/09/2014 1829   CL 103 04/09/2014 1829   CO2 27 12/01/2014 1409   CO2 27 04/09/2014 1829   BUN 13.7 12/01/2014 1409   BUN 17 04/09/2014 1829   CREATININE 0.7 12/01/2014 1409   CREATININE 0.78 04/09/2014 1829      Component Value Date/Time   CALCIUM 9.2 12/01/2014 1409   CALCIUM 9.4 04/09/2014 1829   ALKPHOS 87 12/01/2014 1409   ALKPHOS 83 02/17/2014 1527   AST 24 12/01/2014 1409   AST 14 02/17/2014 1527   ALT 17 12/01/2014 1409   ALT 19 02/17/2014 1527   BILITOT 0.38 12/01/2014 1409   BILITOT 1.2 02/17/2014 1527      STUDIES: Most recent echocardiogram on 08/19/14 showed an EF of 56%  ASSESSMENT: 44 y.o. [place] woman [place]  (1)   status post right breast and right axillary lymph node biopsy 01/20/2014, both positive for a clinically T3 N1-2, clinical stage III a invasive ductal carcinoma, grade 2, estrogen and progesterone receptor negative, HER-2 positive, with an MIB-1 of  85%  (2)  treated neoadjuvantly with 4 dose dense cycles of doxorubicin/cyclophosphamide completed 03/25/2014,  followed by weekly paclitaxel/ carboplatin x12, given along with trastuzumab/ pertuzumab every 3 weeks prior to definitive surgery.   (a) all 4 drugs given with cycle 1, carboplatin was held starting with the second cycle because of poor tolerance  (b) day 1 cycle 3 (paclitaxel only) delayed one week at patient's request (no  trastuzumab or pertuzumab given on this day).  (c) stopped after cycle 3 per patient preference  (3)  trastuzumab alone given once on 08/19/14 then patient had port removed and left for [place]. To restart in April 2016  (4) Patient will likely need a right modified radical mastectomy, followed by postmastectomy radiation.   PLAN: Evangelynn is doing well today. The labs were reviewed in detail and were entirely stable. I consulted with Dr. Jana Hakim regarding her return, and he agrees to restart on trastuzumab. She will need a new echocardiogram and a new loading dose of the drug. She prefers to delay the restart until April, after her son settles from surgery.   I have placed a referral to Dr. Lear Ng office, as the patient stated that he wants to meet with her to discuss the mastectomy plans.  Jannatul will return on 4/13 to restart her trastuzumab peripherally. She will have a follow up visit with Dr. Jana Hakim on this day. She understands and agrees with this plan. She knows the goal of treatment in her case is cure. She has been encouraged to call with any issues that might arise before her next visit here.   Laurie Panda, NP

## 2014-12-01 NOTE — Telephone Encounter (Signed)
appts made and avs printed for pt,email to [facility] for precert for echo  anne

## 2014-12-03 ENCOUNTER — Other Ambulatory Visit: Payer: Self-pay | Admitting: Nurse Practitioner

## 2014-12-03 DIAGNOSIS — C50411 Malignant neoplasm of upper-outer quadrant of right female breast: Secondary | ICD-10-CM

## 2015-01-02 ENCOUNTER — Other Ambulatory Visit: Payer: Self-pay | Admitting: General Surgery

## 2015-01-06 ENCOUNTER — Other Ambulatory Visit: Payer: Self-pay | Admitting: *Deleted

## 2015-01-06 ENCOUNTER — Telehealth: Payer: Self-pay | Admitting: Oncology

## 2015-01-06 NOTE — Telephone Encounter (Signed)
Patient confirmed appointment for Echo . Gave information for Echo patient to see PCP same time.

## 2015-01-06 NOTE — Progress Notes (Signed)
Contacted by pharmacy stating need to schedule ECHO per herceptin protocol.  Noted order placed- not scheduled- urgent POF sent to scheduling.

## 2015-01-07 ENCOUNTER — Ambulatory Visit: Payer: Medicaid Other

## 2015-01-07 ENCOUNTER — Ambulatory Visit: Payer: Medicaid Other | Admitting: Oncology

## 2015-01-07 ENCOUNTER — Telehealth: Payer: Self-pay | Admitting: Oncology

## 2015-01-07 ENCOUNTER — Other Ambulatory Visit: Payer: Medicaid Other

## 2015-01-07 NOTE — Telephone Encounter (Signed)
cannot do any more herceptin until ECHO is done-cld & adv pt -pt stated she will call and r/s ECHO because she has a primary Care appt.Per heather to add on her sch 5/4-added

## 2015-01-07 NOTE — Progress Notes (Signed)
No show

## 2015-01-08 ENCOUNTER — Ambulatory Visit (HOSPITAL_COMMUNITY): Payer: Medicaid Other | Attending: Nurse Practitioner

## 2015-01-27 ENCOUNTER — Other Ambulatory Visit: Payer: Self-pay | Admitting: Nurse Practitioner

## 2015-01-28 ENCOUNTER — Telehealth: Payer: Self-pay | Admitting: *Deleted

## 2015-01-28 ENCOUNTER — Ambulatory Visit: Payer: Medicaid Other | Admitting: Nurse Practitioner

## 2015-01-28 ENCOUNTER — Other Ambulatory Visit: Payer: Medicaid Other

## 2015-01-28 ENCOUNTER — Ambulatory Visit: Payer: Medicaid Other

## 2015-01-28 NOTE — Telephone Encounter (Signed)
Called pt to inquire about missed appt for today. No answer but left a detalied message to pt's VM concerning the need to obtain an Echo before we can r/s her for tx. I told pt to call this nurse back @ [phone] and I will set up a date and time for an echo that works for her. Message to be forwarded to Gentry Fitz, NP.

## 2015-01-28 NOTE — Telephone Encounter (Signed)
Call #2. Attempted to try to reach pt again about missed appt today and to let her know I would like to schedule her for an Echo before we reschedule this missed appt. Message to be forwarded to Gentry Fitz, NP.

## 2015-02-18 ENCOUNTER — Ambulatory Visit: Payer: Medicaid Other

## 2015-02-18 ENCOUNTER — Other Ambulatory Visit: Payer: Medicaid Other

## 2015-05-05 ENCOUNTER — Telehealth: Payer: Self-pay | Admitting: Oncology

## 2015-05-05 NOTE — Telephone Encounter (Signed)
Returned Advertising account executive. Confirmed appointment for 08/22

## 2015-05-15 ENCOUNTER — Other Ambulatory Visit: Payer: Self-pay | Admitting: *Deleted

## 2015-05-15 DIAGNOSIS — C50411 Malignant neoplasm of upper-outer quadrant of right female breast: Secondary | ICD-10-CM

## 2015-05-18 ENCOUNTER — Telehealth: Payer: Self-pay | Admitting: *Deleted

## 2015-05-18 ENCOUNTER — Other Ambulatory Visit: Payer: Medicaid Other

## 2015-05-18 ENCOUNTER — Ambulatory Visit: Payer: Medicaid Other | Admitting: Nurse Practitioner

## 2015-05-18 NOTE — Telephone Encounter (Signed)
Called pt at both numbers listed, home number is disconnected and the mobile number is not reachable. This nurse attempted to call to inquire why she was not able to attend appt today, No answer and not able to leave a VM on either number. Message to be fwd to Gentry Fitz, NP.

## 2015-05-18 NOTE — Telephone Encounter (Signed)
Called Brother in law to try to get in touch with patient. He informed me that pt is in [place]. She left on the Aug 17 and will be back on Aug 29. I told him if he speaks to her to call us so we can r/s the missed appt for today. Message to be fwd to Gentry Fitz, NP.

## 2015-07-10 ENCOUNTER — Telehealth: Payer: Self-pay | Admitting: Nurse Practitioner

## 2015-07-10 NOTE — Telephone Encounter (Signed)
Patient called in and made a new appointment as she has been in [place]

## 2015-07-24 ENCOUNTER — Ambulatory Visit (HOSPITAL_BASED_OUTPATIENT_CLINIC_OR_DEPARTMENT_OTHER): Payer: Medicaid Other | Admitting: Nurse Practitioner

## 2015-07-24 ENCOUNTER — Other Ambulatory Visit (HOSPITAL_BASED_OUTPATIENT_CLINIC_OR_DEPARTMENT_OTHER): Payer: Medicaid Other

## 2015-07-24 ENCOUNTER — Encounter: Payer: Self-pay | Admitting: Nurse Practitioner

## 2015-07-24 ENCOUNTER — Telehealth: Payer: Self-pay | Admitting: Nurse Practitioner

## 2015-07-24 VITALS — BP 158/90 | HR 60 | Temp 98.5°F | Resp 18 | Ht 70.0 in | Wt 191.3 lb

## 2015-07-24 DIAGNOSIS — C773 Secondary and unspecified malignant neoplasm of axilla and upper limb lymph nodes: Secondary | ICD-10-CM

## 2015-07-24 DIAGNOSIS — Z171 Estrogen receptor negative status [ER-]: Secondary | ICD-10-CM

## 2015-07-24 DIAGNOSIS — C50411 Malignant neoplasm of upper-outer quadrant of right female breast: Secondary | ICD-10-CM

## 2015-07-24 DIAGNOSIS — C50811 Malignant neoplasm of overlapping sites of right female breast: Secondary | ICD-10-CM | POA: Diagnosis not present

## 2015-07-24 LAB — COMPREHENSIVE METABOLIC PANEL (CC13)
ALT: 15 U/L (ref 0–55)
AST: 20 U/L (ref 5–34)
Albumin: 3.5 g/dL (ref 3.5–5.0)
Alkaline Phosphatase: 79 U/L (ref 40–150)
Anion Gap: 5 mEq/L (ref 3–11)
BUN: 8.3 mg/dL (ref 7.0–26.0)
CO2: 28 meq/L (ref 22–29)
Calcium: 9.3 mg/dL (ref 8.4–10.4)
Chloride: 109 mEq/L (ref 98–109)
Creatinine: 0.7 mg/dL (ref 0.6–1.1)
GLUCOSE: 83 mg/dL (ref 70–140)
POTASSIUM: 4.3 meq/L (ref 3.5–5.1)
SODIUM: 142 meq/L (ref 136–145)
Total Bilirubin: 0.88 mg/dL (ref 0.20–1.20)
Total Protein: 7.5 g/dL (ref 6.4–8.3)

## 2015-07-24 LAB — CBC WITH DIFFERENTIAL/PLATELET
BASO%: 0.5 % (ref 0.0–2.0)
BASOS ABS: 0 10*3/uL (ref 0.0–0.1)
EOS%: 1.3 % (ref 0.0–7.0)
Eosinophils Absolute: 0.1 10*3/uL (ref 0.0–0.5)
HCT: 36.8 % (ref 34.8–46.6)
HGB: 11.6 g/dL (ref 11.6–15.9)
LYMPH%: 46.9 % (ref 14.0–49.7)
MCH: 22.1 pg — AB (ref 25.1–34.0)
MCHC: 31.5 g/dL (ref 31.5–36.0)
MCV: 70.1 fL — ABNORMAL LOW (ref 79.5–101.0)
MONO#: 0.5 10*3/uL (ref 0.1–0.9)
MONO%: 12.8 % (ref 0.0–14.0)
NEUT#: 1.5 10*3/uL (ref 1.5–6.5)
NEUT%: 38.5 % (ref 38.4–76.8)
NRBC: 0 % (ref 0–0)
Platelets: 177 10*3/uL (ref 145–400)
RBC: 5.25 10*6/uL (ref 3.70–5.45)
RDW: 16.5 % — AB (ref 11.2–14.5)
WBC: 3.9 10*3/uL (ref 3.9–10.3)
lymph#: 1.8 10*3/uL (ref 0.9–3.3)

## 2015-07-24 NOTE — Telephone Encounter (Signed)
Appointments made and avs printed for patient,she will see dr Excell Seltzer 12 20 16  2:00

## 2015-07-24 NOTE — Progress Notes (Signed)
[facility]  [phone] [phone]     ID: Rhonda [name] OB: Mar 11, 1971  MR#: [id number]  JYN#:[id number]  PCP: Tereasa Coop, PA-C GYN:  Mora Bellman, MD SU: Excell Seltzer, MD OTHER MD: Arloa Koh, MD  CHIEF COMPLAINT: Right breast cancer CURRENT TREATMENT: neoadjuvant chemotherapy  BREAST CANCER HISTORY: From the original intake note:  Rhonda [name] was visiting relatives in [place] about 4 months ago when she noted pain in her right breast. In 2003 she had a right breast infection which felt pretty much the same. She was treated with antibiotics back then (while living in [place]). Accordingly, this time she minimized the symptoms. It felt like milk was coming down she says. She also had breast pain while having her period. Then in February 2015, she actually found 2 lumps in her right breast breast which she initially ignored. Eventually as the symptoms seemed to progress she saw a Dr. [place] and he told her she likely had breast cancer. She waited until coming back to the states to seek definitive care.  On 12/17/2013 she had bilateral diagnostic mammography in [facility]). In the upper outer right breast there were pleomorphic calcifications measuring approximately 9 cm in diameter. There were 4 distinct microlobulated masses in the right breast, 3 of them in the upper outer quadrant one in the lower outer quadrant. Each measures approximately 1.2 cm. Ultrasound showed a complex cystic mass in the superior central right breast, a 4 mm hypoechoic lesion, 3 suspicious solid masses elsewhere in the right breast, all of them hypoechoic and oval are round with increased vascularity. The largest of these masses measure 1.7 cm. Findings in the left breast were not suspicious or specific. The patient was referred to the breast Center where on 01/20/2014 she underwent biopsy of 2 of the right breast masses, and a right axillary lymph node.  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  On 01/28/2014 the patient underwent bilateral breast MRI. This showed, in the right breast, multiple irregular enhancing masses in the setting of clumped non-masslike enhancement the area in question measured up to 9.4 cm. There was diffuse asymmetric skin thickening and edema involving the right breast, but no obvious nipple or pectoralis involvement. There were several enlarged and morphologically abnormal right axillary lymph node as well as inter-pectoral and subpectoral adenopathy there the largest right axillary lymph node measured 2.7 cm. There was no internal mammary lymphadenopathy noted. The left breast was unremarkable.  Her subsequent history is as detailed below.  INTERVAL HISTORY: Rhonda [name] returns today for follow up of her breast cancer. She was to restart trastuzumab in April of this year, but never did. She has spent the past few months in [place] and is just now returning. She will be traveling again soon.  REVIEW OF SYSTEMS: The only complaint Rhonda [name] has to offer today is infrequent cramps as if she is about to start her period, but the period never comes. She does not take anything for this pain and it only happens every 6-8 weeks. She is actually not on any medications at all at this point. A detailed review of systems is otherwise stable.  PAST MEDICAL HISTORY: Past Medical History  Diagnosis Date  . Hypertension   . Blood transfusion without reported diagnosis     3 previous   . Anemia   .  Ulcer     hx of stomach ulcer a long time ago  . GERD (gastroesophageal reflux disease)     a long time ago  . Headache(784.0)   . Cancer 10/2013    right breast cancer  . Anxiety     PAST SURGICAL HISTORY: Past Surgical History  Procedure Laterality Date  . Cesarean section       2 previous  . Back surgery  2000  . Appendectomy      a long time ago in [place]  . Portacath placement N/A 02/06/2014    Procedure: INSERTION PORT-A-CATH;  Surgeon: Edward Jolly, MD;  Location: [facility];  Service: General;  Laterality: N/A;  . Port-a-cath removal Left 03/31/2014    Procedure: REMOVAL PORT-A-CATH;  Surgeon: Edward Jolly, MD;  Location: [facility];  Service: General;  Laterality: Left;  . Portacath placement Right 04/14/2014    Procedure: PLACEMENT OF PORT-A-CATH;  Surgeon: Edward Jolly, MD;  Location: [facility];  Service: General;  Laterality: Right;  . Port-a-cath removal N/A 08/16/2014    Procedure: REMOVAL PORT-A-CATH;  Surgeon: Excell Seltzer, MD;  Location: [facility];  Service: General;  Laterality: N/A;    FAMILY HISTORY Family History  Problem Relation Age of Onset  . Hypertension Mother   . Hypertension Father    the patient's parents are living but she is not sure of their age. She had 4 brothers and 4 sisters. 2 other brothers died in the turmoil of [place] and one brother is currently in [place] but they do not know where are even if he is still alive. She has one brother in this area. There is no history of breast or ovarian cancer in the family to her knowledge.  GYNECOLOGIC HISTORY: (Reviewed 03/18/2014) Menarche somewhere between the ages of 7 and 16. First live birth age 74. The patient is GX P3. Her periods are now irregular. She took birth control pills remotely for approximately one year with no complaints.  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 Her husband Rhonda [name] is a former Emergency planning/management officer to the [place] from [place] and is a Optometrist. Their children are Rhonda [name] (11),Rhonda [name] (8) and Rhonda [name] (4).  The youngest child is currently living with Rhonda [name]. The 2 older children are currently in [place]. The patient attends a [employer] locally    ADVANCED DIRECTIVES:  Not in place   HEALTH MAINTENANCE: (Updated 03/18/2014) Social History  Substance Use Topics  . Smoking status: Never Smoker   . Smokeless tobacco: Never Used  . Alcohol Use: No     Colonoscopy: Never  PAP: April 2015/Dr. Constant  Bone density: Never  Lipid panel: Not on file  No Known Allergies  No current outpatient prescriptions on file.   No current facility-administered medications for this visit.    OBJECTIVE:    Filed Vitals:   07/24/15 1009  BP: 158/90  Pulse: 60  Temp: 98.5 F (36.9 C)  Resp: 18   Filed Weights   07/24/15 1009  Weight: 191 lb 4.8 oz (86.773 kg)    middle-aged African woman who appears as stated age  Skin: warm, dry  HEENT: sclerae anicteric, conjunctivae pink, oropharynx clear. No thrush or mucositis.  Lymph Nodes: No cervical or supraclavicular lymphadenopathy  Lungs: clear to auscultation bilaterally, no rales, wheezes, or rhonci  Heart: regular rate and rhythm  Abdomen: round, soft, non tender, positive bowel sounds  Musculoskeletal: No focal spinal  tenderness, no peripheral edema  Neuro: non focal, well oriented, positive affect  Breasts: thickness behind right nipple, possible new mass ~3cm to the midline of outer quadrant palpated. No skin or nipple changes. Right axilla benign. Left breasts unremarkable.   LAB RESULTS:   Lab Results  Component Value Date   WBC 3.9 07/24/2015   NEUTROABS 1.5 07/24/2015   HGB 11.6 07/24/2015   HCT 36.8 07/24/2015   MCV 70.1* 07/24/2015   PLT 177 07/24/2015      Chemistry      Component Value Date/Time   NA 142 07/24/2015 0957   NA 141 04/09/2014 1829   K 4.3 07/24/2015 0957   K 4.5 04/09/2014 1829   CL 103 04/09/2014 1829   CO2 28 07/24/2015 0957   CO2 27 04/09/2014 1829   BUN 8.3 07/24/2015 0957   BUN 17 04/09/2014 1829   CREATININE 0.7 07/24/2015 0957   CREATININE 0.78 04/09/2014 1829      Component Value Date/Time   CALCIUM 9.3 07/24/2015 0957   CALCIUM 9.4 04/09/2014  1829   ALKPHOS 79 07/24/2015 0957   ALKPHOS 83 02/17/2014 1527   AST 20 07/24/2015 0957   AST 14 02/17/2014 1527   ALT 15 07/24/2015 0957   ALT 19 02/17/2014 1527   BILITOT 0.88 07/24/2015 0957   BILITOT 1.2 02/17/2014 1527      STUDIES: No results found.  ASSESSMENT: 44 y.o. [place] woman [place] from [place]  (1)   status post right breast and right axillary lymph node biopsy 01/20/2014, both positive for a clinically T3 N1-2, clinical stage III a invasive ductal carcinoma, grade 2, estrogen and progesterone receptor negative, HER-2 positive, with an MIB-1 of 85%  (2)  treated neoadjuvantly with 4 dose dense cycles of doxorubicin/cyclophosphamide completed 03/25/2014,  followed by weekly paclitaxel/ carboplatin x12, given along with trastuzumab/ pertuzumab every 3 weeks prior to definitive surgery.   (a) all 4 drugs given with cycle 1, carboplatin was held starting with the second cycle because of poor tolerance  (b) day 1 cycle 3 (paclitaxel only) delayed one week at patient's request (no trastuzumab or pertuzumab given on this day).  (c) stopped after cycle 3 per patient preference  (3)  trastuzumab alone given once on 08/19/14 then stopped.   (4) Patient will likely need a right modified radical mastectomy, followed by postmastectomy radiation.   PLAN: Rhonda [name] is going to go ahead and give up on the idea of trying trastuzumab again. She understands, however that surgery is a priority at this point. She claims to have met with Dr. Excell Seltzer earlier in the year, but they never nailed down a time for a mastectomy because she left for [place] for a few months.   I have ordered a repeat breast MRI to be performed. She will be gone 11/2-11/10, so the goal is to have this performed before she leaves and to see Dr. Excell Seltzer as soon as she returns. She has trips planned for Christmas, and in early 2017 she will return to [place] for several months.   Rhonda [name] will follow up with me  later this month to ensure that this has worked out smoothly. She could benefit from other restaging scans in the future, but for now she is pressed for time. We will discuss this at this visit. She understands and agrees with this plan. She has been encouraged to call with any issues that might arise before her next visit here.    Rhonda Panda, NP

## 2015-07-27 ENCOUNTER — Other Ambulatory Visit: Payer: Self-pay | Admitting: Nurse Practitioner

## 2015-07-27 DIAGNOSIS — C50411 Malignant neoplasm of upper-outer quadrant of right female breast: Secondary | ICD-10-CM

## 2015-07-29 ENCOUNTER — Telehealth: Payer: Self-pay | Admitting: *Deleted

## 2015-07-29 ENCOUNTER — Other Ambulatory Visit: Payer: Self-pay | Admitting: *Deleted

## 2015-07-29 NOTE — Telephone Encounter (Signed)
This RN called to [facility] to give additional data per denials for PET and MRI of breast.  This RN spoke with Jinny Blossom RN - reviewed pt's date of diagnosis and treatment schedule with noted break from 08/19/2014 to now.  This RN informed Jinny Blossom pt's plan of care was interuppted due to need to return to [place] with husband per his employment.  Pt has now returned and plan is to not do further chemo at this time.  For best outcome pt should proceed directly to surgery -

## 2015-08-19 ENCOUNTER — Other Ambulatory Visit: Payer: Self-pay | Admitting: *Deleted

## 2015-08-19 DIAGNOSIS — C50411 Malignant neoplasm of upper-outer quadrant of right female breast: Secondary | ICD-10-CM

## 2015-08-24 ENCOUNTER — Other Ambulatory Visit: Payer: Medicaid Other

## 2015-08-24 ENCOUNTER — Other Ambulatory Visit: Payer: Self-pay | Admitting: Nurse Practitioner

## 2015-08-24 ENCOUNTER — Ambulatory Visit: Payer: Medicaid Other | Admitting: Nurse Practitioner

## 2015-08-25 ENCOUNTER — Ambulatory Visit (HOSPITAL_COMMUNITY): Payer: Medicaid Other

## 2015-08-28 ENCOUNTER — Telehealth: Payer: Self-pay | Admitting: *Deleted

## 2015-08-28 NOTE — Telephone Encounter (Signed)
Called  To follow up again to see if pt is back in the country so I can r/s her missed appt. No answer to pt's phone but left a detailed message. I also called her brother in law again today  to f/u from a prev call on Tuesday. He was supposed to get in contact with pt to let her know about the importance of r/s this appt. No answer but left a message to his VM as well. Message to be fwd to Gentry Fitz, NP.

## 2015-09-13 NOTE — Progress Notes (Signed)
This encounter was created in error - please disregard.

## 2015-10-02 ENCOUNTER — Telehealth: Payer: Self-pay | Admitting: *Deleted

## 2015-10-02 NOTE — Telephone Encounter (Signed)
"  I just spoke with someone two minutes ago for my records.  I really just need my most recent lab results.  I was told to pick this information up on Monday.  Return number [phone].  If you don't reach them It's okay, I'll go through thr package or talk to someone Monday."    Collaborative nurse denies recent cal to patient. Will notify H.I.M.

## 2015-12-31 ENCOUNTER — Other Ambulatory Visit: Payer: Self-pay | Admitting: Nurse Practitioner

## 2015-12-31 ENCOUNTER — Emergency Department (HOSPITAL_COMMUNITY): Payer: Self-pay

## 2015-12-31 ENCOUNTER — Emergency Department (HOSPITAL_COMMUNITY)
Admission: EM | Admit: 2015-12-31 | Discharge: 2015-12-31 | Disposition: A | Discharge status: Home / Self Care | Payer: Self-pay | Attending: Emergency Medicine | Admitting: Emergency Medicine

## 2015-12-31 ENCOUNTER — Encounter (HOSPITAL_COMMUNITY): Payer: Self-pay | Admitting: Emergency Medicine

## 2015-12-31 DIAGNOSIS — Z862 Personal history of diseases of the blood and blood-forming organs and certain disorders involving the immune mechanism: Secondary | ICD-10-CM | POA: Insufficient documentation

## 2015-12-31 DIAGNOSIS — C799 Secondary malignant neoplasm of unspecified site: Secondary | ICD-10-CM

## 2015-12-31 DIAGNOSIS — R059 Cough, unspecified: Secondary | ICD-10-CM

## 2015-12-31 DIAGNOSIS — I10 Essential (primary) hypertension: Secondary | ICD-10-CM | POA: Insufficient documentation

## 2015-12-31 DIAGNOSIS — K219 Gastro-esophageal reflux disease without esophagitis: Secondary | ICD-10-CM | POA: Insufficient documentation

## 2015-12-31 DIAGNOSIS — F419 Anxiety disorder, unspecified: Secondary | ICD-10-CM | POA: Insufficient documentation

## 2015-12-31 DIAGNOSIS — R05 Cough: Secondary | ICD-10-CM | POA: Insufficient documentation

## 2015-12-31 DIAGNOSIS — Z3202 Encounter for pregnancy test, result negative: Secondary | ICD-10-CM | POA: Insufficient documentation

## 2015-12-31 DIAGNOSIS — Z853 Personal history of malignant neoplasm of breast: Secondary | ICD-10-CM | POA: Insufficient documentation

## 2015-12-31 LAB — CBC WITH DIFFERENTIAL/PLATELET
BASOS ABS: 0 10*3/uL (ref 0.0–0.1)
Basophils Relative: 0 %
EOS ABS: 0.1 10*3/uL (ref 0.0–0.7)
Eosinophils Relative: 1 %
HCT: 38.5 % (ref 36.0–46.0)
Hemoglobin: 12.5 g/dL (ref 12.0–15.0)
LYMPHS ABS: 2.4 10*3/uL (ref 0.7–4.0)
LYMPHS PCT: 49 %
MCH: 22.4 pg — ABNORMAL LOW (ref 26.0–34.0)
MCHC: 32.5 g/dL (ref 30.0–36.0)
MCV: 69.1 fL — ABNORMAL LOW (ref 78.0–100.0)
MONO ABS: 0.3 10*3/uL (ref 0.1–1.0)
Monocytes Relative: 7 %
NEUTROS PCT: 43 %
Neutro Abs: 2.1 10*3/uL (ref 1.7–7.7)
Platelets: 277 10*3/uL (ref 150–400)
RBC: 5.57 MIL/uL — AB (ref 3.87–5.11)
RDW: 16.4 % — AB (ref 11.5–15.5)
WBC: 4.9 10*3/uL (ref 4.0–10.5)

## 2015-12-31 LAB — COMPREHENSIVE METABOLIC PANEL
ALBUMIN: 4 g/dL (ref 3.5–5.0)
ALT: 12 U/L — ABNORMAL LOW (ref 14–54)
ANION GAP: 8 (ref 5–15)
AST: 24 U/L (ref 15–41)
Alkaline Phosphatase: 67 U/L (ref 38–126)
BILIRUBIN TOTAL: 0.6 mg/dL (ref 0.3–1.2)
BUN: 11 mg/dL (ref 6–20)
CHLORIDE: 107 mmol/L (ref 101–111)
CO2: 26 mmol/L (ref 22–32)
Calcium: 9.4 mg/dL (ref 8.9–10.3)
Creatinine, Ser: 0.72 mg/dL (ref 0.44–1.00)
GFR calc Af Amer: >60 mL/min (ref >60)
GFR calc non Af Amer: >60 mL/min (ref >60)
GLUCOSE: 87 mg/dL (ref 65–99)
POTASSIUM: 4.4 mmol/L (ref 3.5–5.1)
Sodium: 141 mmol/L (ref 135–145)
TOTAL PROTEIN: 8 g/dL (ref 6.5–8.1)

## 2015-12-31 LAB — POC URINE PREG, ED: Preg Test, Ur: NEGATIVE

## 2015-12-31 MED ORDER — ALBUTEROL SULFATE (2.5 MG/3ML) 0.083% IN NEBU
5.0000 mg | INHALATION_SOLUTION | Freq: Once | RESPIRATORY_TRACT | Status: AC
  Administered 2015-12-31: 5 mg via RESPIRATORY_TRACT
  Filled 2015-12-31: qty 6

## 2015-12-31 MED ORDER — IOPAMIDOL (ISOVUE-300) INJECTION 61%
100.0000 mL | Freq: Once | INTRAVENOUS | Status: AC | PRN
  Administered 2015-12-31: 100 mL via INTRAVENOUS

## 2015-12-31 MED ORDER — ALBUTEROL SULFATE HFA 108 (90 BASE) MCG/ACT IN AERS
2.0000 | INHALATION_SPRAY | Freq: Once | RESPIRATORY_TRACT | Status: AC
  Administered 2015-12-31: 2 via RESPIRATORY_TRACT
  Filled 2015-12-31: qty 6.7

## 2015-12-31 MED ORDER — IPRATROPIUM BROMIDE 0.02 % IN SOLN
0.5000 mg | Freq: Once | RESPIRATORY_TRACT | Status: AC
  Administered 2015-12-31: 0.5 mg via RESPIRATORY_TRACT
  Filled 2015-12-31: qty 2.5

## 2015-12-31 MED ORDER — IOHEXOL 300 MG/ML  SOLN
50.0000 mL | Freq: Once | INTRAMUSCULAR | Status: AC | PRN
  Administered 2015-12-31: 50 mL via ORAL

## 2015-12-31 MED ORDER — BENZONATATE 100 MG PO CAPS: 100.0000 mg | ORAL_CAPSULE | Freq: Three times a day (TID) | ORAL | Status: DC

## 2015-12-31 NOTE — ED Notes (Signed)
Pt states she's been out of the country to [place] since Feb of this year and just returned yesterday.  Went to [facility] yesterday, had a chest xray and was instructed to come here to the ED.

## 2015-12-31 NOTE — Discharge Instructions (Signed)
Breast Cancer, Female Breast cancer is an abnormal growth of tissue (tumor) in the breast that is cancerous (malignant). Unlike noncancerous (benign) tumors, malignant tumors can spread to other parts of your body. The most common type of female breast cancer begins in the milk ducts (ductal carcinoma). Breast cancer is one of the most common types of cancer in women. CAUSES  The exact cause of female breast cancer is unknown.  RISK FACTORS  Age older than 60 years.  Family history of breast cancer.  Having the BRCA1 and BRCA2 genes.  Personal history of radiation exposure.  Obesity.  Menstrual periods that begin before age 67 years.  Menopause that begins after age 65 years.  Pregnant for the first time at the age of 29 years or older.  Using hormone therapy.  Drinking more than one alcoholic drink per day. SIGNS AND SYMPTOMS   A painless lump in your breast.  Changes in the size or shape of your breast.  Breast skin changes, such as puckering or dimpling.  Nipple abnormalities, such as scaling, crustiness, redness, or pulling in (retraction).  Nipple discharge that is bloody or clear. DIAGNOSIS  Your health care provider will ask about your medical history. He or she may also perform a number of procedures, such as:  A physical exam. This will involve feeling the tissue around the breast and under the arms.  Taking a sample of nipple discharge. The sample will be examined under a microscope.  Breast X-rays (mammogram), breast ultrasound exams, or an MRI.  Taking a tissue sample (biopsy) from the breast. The sample will be examined under a microscope to look for cancer cells. Your cancer will be staged to determine its severity and extent. Staging is a careful attempt to find out the size of the tumor, whether the cancer has spread, and if so, to what parts of the body. You may need to have more tests to determine the stage of your cancer:  Stage 0--The tumor has not  spread to other breast tissue.  Stage I--The cancer is only found in the breast. The tumor may be up to  in (2 cm) wide.  Stage II--The cancer has spread to nearby lymph nodes. The tumor may be up to 2 in (5 cm) wide.  Stage III--The cancer has spread to more distant lymph nodes. The tumor may be larger than 2 in (5 cm) wide.  Stage IV--The cancer has spread to other parts of the body, such as the bones, brain, liver, or lungs. TREATMENT  Depending on the type and stage, female breast cancer may be treated with one or more of the following therapies:  Surgery to remove just the tumor (lumpectomy) or the entire breast (mastectomy). Lymph nodes may also be removed.  Radiation therapy, which uses high-energy rays to kill cancer cells.  Chemotherapy, which is the use of drugs to kill cancer cells.  Hormone therapy, which involves taking medicine to adjust the hormone levels in your body. You may take medicine to decrease your estrogen levels. This can help stop cancer cells from growing. HOME CARE INSTRUCTIONS   Take medicines only as directed by your health care provider.  Maintain a healthy diet.  Consider joining a support group. This may help you learn to cope with the stress of having breast cancer.  Keep all follow-up appointments as directed by your health care provider. SEEK MEDICAL CARE IF:  You have a sudden increase in pain.  You notice a new lump in either  breast or under your arm.  You develop swelling in either arm or hand.  You lose weight without trying.  You have a fever.  You notice new fatigue or weakness. SEEK IMMEDIATE MEDICAL CARE IF:   You have chest pain or trouble breathing.  You faint.   This information is not intended to replace advice given to you by your health care provider. Make sure you discuss any questions you have with your health care provider.   Document Released: 12/21/2005 Document Revised: 06/03/2015 Document Reviewed:  11/06/2013 Elsevier Interactive Patient Education [employer].

## 2015-12-31 NOTE — ED Notes (Signed)
Per pt, states she has had cough for 2 months-had prior to going to [place]-was given antibiotics prior to travel-was seen at [facility] last night and was told to come here for further eval

## 2015-12-31 NOTE — ED Provider Notes (Signed)
CSN: [id number]     Arrival date & time 12/31/15  [id number] History   First MD Initiated Contact with Patient 12/31/15 0920     Chief Complaint  Patient presents with  . Cough      HPI Patient presents to the emergency department with complaints of ongoing persistent cough over the past 2 months.  She states she's been [place] and just returned yesterday but her cough was present prior to going to [place].  She denies fevers and chills.  Prior to going to after she was started on antibiotics but this has not helped her cough.  She does have a history of breast cancer and underwent partial treatment but did not finish chemotherapy.  She did not have a radical mastectomy performed as was recommended by her oncology team.  She has a history of hypertension, GERD, anemia.  She went to urgent care yesterday and they reported that her chest x-ray was abnormal and recommended that she come to the ER for additional evaluation.  She states persistent cough which is worse at night.   Past Medical History  Diagnosis Date  . Hypertension   . Blood transfusion without reported diagnosis     3 previous   . Anemia   . Ulcer     hx of stomach ulcer a long time ago  . GERD (gastroesophageal reflux disease)     a long time ago  . Headache(784.0)   . Anxiety   . Cancer [facility]) 10/2013    right breast cancer   Past Surgical History  Procedure Laterality Date  . Cesarean section      2 previous  . Back surgery  2000  . Appendectomy      a long time ago in [place]  . Portacath placement N/A 02/06/2014    Procedure: INSERTION PORT-A-CATH;  Surgeon: Edward Jolly, MD;  Location: [facility];  Service: General;  Laterality: N/A;  . Port-a-cath removal Left 03/31/2014    Procedure: REMOVAL PORT-A-CATH;  Surgeon: Edward Jolly, MD;  Location: [place];  Service: General;  Laterality: Left;  . Portacath placement Right 04/14/2014    Procedure: PLACEMENT OF PORT-A-CATH;  Surgeon: Edward Jolly, MD;  Location: [place];  Service: General;  Laterality: Right;  . Port-a-cath removal N/A 08/16/2014    Procedure: REMOVAL PORT-A-CATH;  Surgeon: Excell Seltzer, MD;  Location: [facility];  Service: General;  Laterality: N/A;   Family History  Problem Relation Age of Onset  . Hypertension Mother   . Hypertension Father    Social History  Substance Use Topics  . Smoking status: Never Smoker   . Smokeless tobacco: Never Used  . Alcohol Use: No   OB History    Gravida Para Term Preterm AB TAB SAB Ectopic Multiple Living   5 3 3  2  2   5      Review of Systems  All other systems reviewed and are negative.     Allergies  Review of patient's allergies indicates no known allergies.  Home Medications   Prior to Admission medications   Medication Sig Start Date End Date Taking? Authorizing Provider  benzonatate (TESSALON) 100 MG capsule Take 1 capsule (100 mg total) by mouth every 8 (eight) hours. 12/31/15   Jola Schmidt, MD   BP 139/83 mmHg  Pulse 66  Temp(Src) 98.2 F (36.8 C) (Oral)  Resp 14  SpO2 100%  LMP  Physical Exam  Constitutional: She is oriented to person,  place, and time. She appears well-developed and well-nourished. No distress.  HENT:  Head: Normocephalic and atraumatic.  Eyes: EOM are normal.  Neck: Normal range of motion.  Cardiovascular: Normal rate, regular rhythm and normal heart sounds.   Pulmonary/Chest: Effort normal and breath sounds normal. She has no wheezes.  Abdominal: Soft. She exhibits no distension. There is no tenderness.  Musculoskeletal: Normal range of motion.  Neurological: She is alert and oriented to person, place, and time.  Skin: Skin is warm and dry.  Psychiatric: She has a normal mood and affect. Judgment normal.  Nursing note and vitals reviewed.   ED Course  Procedures (including critical care time) Labs Review Labs Reviewed  CBC WITH DIFFERENTIAL/PLATELET - Abnormal; Notable for the following:     RBC 5.57 (*)    MCV 69.1 (*)    MCH 22.4 (*)    RDW 16.4 (*)    All other components within normal limits  COMPREHENSIVE METABOLIC PANEL - Abnormal; Notable for the following:    ALT 12 (*)    All other components within normal limits  POC URINE PREG, ED    Imaging Review Dg Chest 2 View  12/31/2015  CLINICAL DATA:  Cough for 2 months, has been out of country in [place], history breast cancer with incomplete treatment, hypertension, GERD EXAM: CHEST  2 VIEW COMPARISON:  04/14/2014; correlation CT chest 08/04/2014 FINDINGS: Borderline enlargement of cardiac silhouette. Normal mediastinal contours. Peribronchial thickening with somewhat patchy nodular appearing opacities throughout both lungs, could represent infiltrate but tumor spread not excluded. Tiny pleural effusions blunt the posterior costophrenic angles. No osseous lesions identified. IMPRESSION: Peribronchial thickening with patchy somewhat nodular appearing infiltrates throughout both lungs, may represent infection but metastatic disease not completely excluded; further assessment by CT chest with contrast recommended. Electronically Signed   By: Lavonia Dana M.D.   On: 12/31/2015 11:14   Ct Chest W Contrast  12/31/2015  CLINICAL DATA:  Persistent cough for 2 months. History of right breast cancer. EXAM: CT CHEST, ABDOMEN, AND PELVIS WITH CONTRAST TECHNIQUE: Multidetector CT imaging of the chest, abdomen and pelvis was performed following the standard protocol during bolus administration of intravenous contrast. CONTRAST:  176mL ISOVUE-300 IOPAMIDOL (ISOVUE-300) INJECTION 61% COMPARISON:  CT scan of chest of August 04, 2014. CT scan of abdomen and pelvis of March 03, 2014. FINDINGS: CT CHEST No pneumothorax is noted. Minimal bilateral pleural effusions are noted. Multiple diffuse pulmonary nodules are noted bilaterally consistent with metastatic disease. New 2.6 x 2.2 cm left perihilar mass is noted concerning for metastatic disease.  Precarinal lymph node measuring 2.6 x 1.5 cm is noted consistent with metastatic disease. Thoracic aorta appears unremarkable. Left axillary adenopathy measuring 19 x 13 mm is noted. Right internal jugular Port-A-Cath noted on prior exam has been removed. Thickening of the scan of right breast is noted, with new 2.0 x 1.8 cm mass seen in right axillary tail. These findings are concerning for recurrent malignancy. No significant osseous abnormality is noted in the chest. CT ABDOMEN AND PELVIS No gallstones are noted. The liver, spleen and pancreas appear normal. Adrenal glands and kidneys appear normal. No hydronephrosis or renal obstruction is noted. There is no evidence of bowel obstruction. No abnormal fluid collection is noted. Uterus and ovaries are unremarkable. Urinary bladder appears normal. No significant adenopathy is noted. Degenerative changes are noted in the lower lumbar spine. IMPRESSION: There is interval development of nodular skin thickening of the right breast with new 2 cm mass seen in  right axillary tail consistent with recurrent breast malignancy. There is also noted interval development of mediastinal and left axillary metastatic adenopathy, with diffuse bilateral pulmonary metastases. No evidence of metastatic disease is noted in the abdomen or pelvis. Electronically Signed   By: Marijo Conception, M.D.   On: 12/31/2015 13:37   Ct Abdomen Pelvis W Contrast  12/31/2015  CLINICAL DATA:  Persistent cough for 2 months. History of right breast cancer. EXAM: CT CHEST, ABDOMEN, AND PELVIS WITH CONTRAST TECHNIQUE: Multidetector CT imaging of the chest, abdomen and pelvis was performed following the standard protocol during bolus administration of intravenous contrast. CONTRAST:  113mL ISOVUE-300 IOPAMIDOL (ISOVUE-300) INJECTION 61% COMPARISON:  CT scan of chest of August 04, 2014. CT scan of abdomen and pelvis of March 03, 2014. FINDINGS: CT CHEST No pneumothorax is noted. Minimal bilateral pleural  effusions are noted. Multiple diffuse pulmonary nodules are noted bilaterally consistent with metastatic disease. New 2.6 x 2.2 cm left perihilar mass is noted concerning for metastatic disease. Precarinal lymph node measuring 2.6 x 1.5 cm is noted consistent with metastatic disease. Thoracic aorta appears unremarkable. Left axillary adenopathy measuring 19 x 13 mm is noted. Right internal jugular Port-A-Cath noted on prior exam has been removed. Thickening of the scan of right breast is noted, with new 2.0 x 1.8 cm mass seen in right axillary tail. These findings are concerning for recurrent malignancy. No significant osseous abnormality is noted in the chest. CT ABDOMEN AND PELVIS No gallstones are noted. The liver, spleen and pancreas appear normal. Adrenal glands and kidneys appear normal. No hydronephrosis or renal obstruction is noted. There is no evidence of bowel obstruction. No abnormal fluid collection is noted. Uterus and ovaries are unremarkable. Urinary bladder appears normal. No significant adenopathy is noted. Degenerative changes are noted in the lower lumbar spine. IMPRESSION: There is interval development of nodular skin thickening of the right breast with new 2 cm mass seen in right axillary tail consistent with recurrent breast malignancy. There is also noted interval development of mediastinal and left axillary metastatic adenopathy, with diffuse bilateral pulmonary metastases. No evidence of metastatic disease is noted in the abdomen or pelvis. Electronically Signed   By: Marijo Conception, M.D.   On: 12/31/2015 13:37   I have personally reviewed and evaluated these images and lab results as part of my medical decision-making.   EKG Interpretation None      MDM   Final diagnoses:  Metastatic cancer ([place])  Cough    Abnormal chest x-ray therefore patient was sent for staging CT scan of her chest abdomen and pelvis.  CT chest with diffuse pulmonary metastases and likely recurrent  breast cancer noted on CT through her right breast.  Patient be referred back to oncology at this time and she will need to be completely restaged and a new treatment plan will need to be had.  In regards to her cough I prescribed albuterol when necessary cough as well as Tessalon Per[place].  No hypoxia.  No shortness of breath.  Doubt PE.  She understands the importance of oncology follow-up and a copy of this note has been sent to her oncologist.  I reiterated that the most important thing for her to do his receive complete treatment of her breast cancer and follow all the directions and recommendations of her oncology team.  She continues to travel intermittently to [place] which has limited her treatment.    Jola Schmidt, MD 12/31/15 [phone]

## 2016-01-01 ENCOUNTER — Encounter: Payer: Self-pay | Admitting: Nurse Practitioner

## 2016-01-01 ENCOUNTER — Telehealth: Payer: Self-pay | Admitting: Nurse Practitioner

## 2016-01-01 ENCOUNTER — Ambulatory Visit (HOSPITAL_BASED_OUTPATIENT_CLINIC_OR_DEPARTMENT_OTHER): Payer: Self-pay | Admitting: Nurse Practitioner

## 2016-01-01 VITALS — BP 131/90 | HR 70 | Temp 98.4°F | Resp 18 | Ht 70.0 in | Wt 177.2 lb

## 2016-01-01 DIAGNOSIS — C50411 Malignant neoplasm of upper-outer quadrant of right female breast: Secondary | ICD-10-CM

## 2016-01-01 DIAGNOSIS — C7800 Secondary malignant neoplasm of unspecified lung: Secondary | ICD-10-CM

## 2016-01-01 DIAGNOSIS — C773 Secondary and unspecified malignant neoplasm of axilla and upper limb lymph nodes: Secondary | ICD-10-CM

## 2016-01-01 DIAGNOSIS — C50811 Malignant neoplasm of overlapping sites of right female breast: Secondary | ICD-10-CM

## 2016-01-01 DIAGNOSIS — Z171 Estrogen receptor negative status [ER-]: Secondary | ICD-10-CM

## 2016-01-01 NOTE — Telephone Encounter (Signed)
spoke w/ pt .. confirmed appt for 4/21 @ 1:15

## 2016-01-01 NOTE — Progress Notes (Signed)
[place]  Telephone:(336) [phone] Fax:(336) [phone]   ID: [name] OB: [birth year]  MR#: [id number]  KHT#:[id number]  PCP: Tereasa Coop, PA-C GYN:  Mora Bellman, MD SU: Excell Seltzer, MD OTHER MD: Arloa Koh, MD  CHIEF COMPLAINT: Right breast cancer  CURRENT TREATMENT: none. Immunotherapy pending.  BREAST CANCER HISTORY: From the original intake note:  Rhonda [name] was visiting relatives in [place] about 4 months ago when she noted pain in her right breast. In 2003 she had a right breast infection which felt pretty much the same. She was treated with antibiotics back then (while living in [place]). Accordingly, this time she minimized the symptoms. It felt like milk was coming down she says. She also had breast pain while having her period. Then in February 2015, she actually found 2 lumps in her right breast breast which she initially ignored. Eventually as the symptoms seemed to progress she saw a Dr. In [place] and he told her she likely had breast cancer. She waited until coming [place] the states to seek definitive care.  On 12/17/2013 she had bilateral diagnostic mammography in [place] [facility]). In the upper outer right breast there were pleomorphic calcifications measuring approximately 9 cm in diameter. There were 4 distinct microlobulated masses in the right breast, 3 of them in the upper outer quadrant one in the lower outer quadrant. Each measures approximately 1.2 cm. Ultrasound showed a complex cystic mass in the superior central right breast, a 4 mm hypoechoic lesion, 3 suspicious solid masses elsewhere in the right breast, all of them hypoechoic and oval are round with increased vascularity. The largest of these masses measure 1.7 cm. Findings in the left breast were not suspicious or specific. The patient was referred to the breast Center where on 01/20/2014 she underwent biopsy of 2 of the right breast masses, and a right axillary lymph node.  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  On 01/28/2014 the patient underwent bilateral breast MRI. This showed, in the right breast, multiple irregular enhancing masses in the setting of clumped non-masslike enhancement the area in question measured up to 9.4 cm. There was diffuse asymmetric skin thickening and edema involving the right breast, but no obvious nipple or pectoralis involvement. There were several enlarged and morphologically abnormal right axillary lymph node as well as inter-pectoral and subpectoral adenopathy there the largest right axillary lymph node measured 2.7 cm. There was no internal mammary lymphadenopathy noted. The left breast was unremarkable.  Her subsequent history is as detailed below.  INTERVAL HISTORY: Shawndra returns today for follow up of her breast cancer. She returned from [place] earlier this week. She presented to the ED yesterday with complaints of a cough for the past 2 months. She was treated previously with antibiotics and steroids with no improvement back in [place]. A CT scan performed showed mets to the lungs. Of course since the time of her last visit, the masses to her breast have enlarged and progressed. For the most part she has "ignored" them. A few have started to weap. She has also had drainage from her nipple, but this is not happening currently. The breast is uncomfortable. She felt better have have them drained by a medical professional back home.   REVIEW OF SYSTEMS: [name] coughs, mostly while speaking. It does not bother her at night. She coughs less  when she is wearing a mass. Her chest is sore from coughing for so long, but she does not have pain anywhere else. She is down almost 15lb since her last visit, but she believes this is because she has become a vegetarian in the past 4  months. She denies fevers, chills, nausea, vomiting, or changes in bowel or bladder habits. She has no headaches, dizziness, or weakness. She is short of breath with exertion, but denies palpitations or cardiac related chest pain. She has no upper or lower extremity edema. Her energy level is good. She sleep well. A detailed review of systems is otherwise stable.    PAST MEDICAL HISTORY: Past Medical History  Diagnosis Date  . Hypertension   . Blood transfusion without reported diagnosis     3 previous   . Anemia   . Ulcer     hx of stomach ulcer a long time ago  . GERD (gastroesophageal reflux disease)     a long time ago  . Headache(784.0)   . Anxiety   . Cancer [facility]) 10/2013    right breast cancer    PAST SURGICAL HISTORY: Past Surgical History  Procedure Laterality Date  . Cesarean section      2 previous  . Back surgery  2000  . Appendectomy      a long time ago in [place]  . Portacath placement N/A 02/06/2014    Procedure: INSERTION PORT-A-CATH;  Surgeon: Edward Jolly, MD;  Location: [facility];  Service: General;  Laterality: N/A;  . Port-a-cath removal Left 03/31/2014    Procedure: REMOVAL PORT-A-CATH;  Surgeon: Edward Jolly, MD;  Location: [place];  Service: General;  Laterality: Left;  . Portacath placement Right 04/14/2014    Procedure: PLACEMENT OF PORT-A-CATH;  Surgeon: Edward Jolly, MD;  Location: [place];  Service: General;  Laterality: Right;  . Port-a-cath removal N/A 08/16/2014    Procedure: REMOVAL PORT-A-CATH;  Surgeon: Excell Seltzer, MD;  Location: [facility];  Service: General;  Laterality: N/A;    FAMILY HISTORY Family History  Problem Relation Age of Onset  . Hypertension Mother   . Hypertension Father    the patient's parents are living but she is not sure of their age. She had 4 brothers and 4 sisters. 2 other brothers died in the [place] and one brother is currently in [place] but they  do not know where are even if he is still alive. She has one brother in this area. There is no history of breast or ovarian cancer in the family to her knowledge.  GYNECOLOGIC HISTORY: (Reviewed 03/18/2014) Menarche somewhere between the ages of 33 and 54. First live birth age 34. The patient is GX P3. Her periods are now irregular. She took birth control pills remotely for approximately one year with no complaints.  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 Her husband Jacquenette Shone Lol Dalene Carrow is a former Emergency planning/management officer to the [place] from [place] and is a Optometrist. Their children are Duopl (11),Nyamal (8) and Nyewech (4).  The youngest child is currently living with Apolonio Schneiders. The 2 older children are currently in [place]. The patient attends a [employer] church locally    ADVANCED DIRECTIVES: Not in place   HEALTH MAINTENANCE: (Updated 03/18/2014) Social History  Substance Use Topics  . Smoking status: Never Smoker   . Smokeless tobacco: Never Used  . Alcohol Use: No     Colonoscopy: Never  PAP: April 2015/Dr. Constant  Bone density: Never  Lipid panel: Not on file  No Known Allergies  Current Outpatient Prescriptions  Medication Sig Dispense Refill  . benzonatate (TESSALON) 100 MG capsule Take 1 capsule (100 mg total) by mouth every 8 (eight) hours. 21 capsule 0  . UNABLE TO FIND Vit D and Vit C in herb form     No current facility-administered medications for this visit.    OBJECTIVE: middle-aged African woman who appears as stated age    52 Vitals:   01/01/16 1415  BP: 131/90  Pulse: 70  Temp: 98.4 F (36.9 C)  Resp: 18   Filed Weights   01/01/16 1415  Weight: 177 lb 3.2 oz (80.377 kg)    Skin: warm, dry  HEENT: sclerae anicteric, conjunctivae pink, oropharynx clear. No thrush or mucositis.  Lymph Nodes: No cervical or supraclavicular lymphadenopathy  Lungs: clear to auscultation bilaterally, no rales, wheezes, or rhonci  Heart: regular  rate and rhythm  Abdomen: round, soft, non tender, positive bowel sounds  Musculoskeletal: No focal spinal tenderness, no peripheral edema  Neuro: non focal, well oriented, positive affect  Breasts: Multiple masses to all 4 quadrants palpated to right breast ranging from 1cm-3cm. In particular there are two 1cm lesions just above the nipple that are open with dried discharge. Right axilla benign. Thickness palpated to left axilla . Left breast unremarkable. (patient declined picture to be placed in chart).  LAB RESULTS:   Lab Results  Component Value Date   WBC 4.9 12/31/2015   NEUTROABS 2.1 12/31/2015   HGB 12.5 12/31/2015   HCT 38.5 12/31/2015   MCV 69.1* 12/31/2015   PLT 277 12/31/2015      Chemistry      Component Value Date/Time   NA 141 12/31/2015 1036   NA 142 07/24/2015 0957   K 4.4 12/31/2015 1036   K 4.3 07/24/2015 0957   CL 107 12/31/2015 1036   CO2 26 12/31/2015 1036   CO2 28 07/24/2015 0957   BUN 11 12/31/2015 1036   BUN 8.3 07/24/2015 0957   CREATININE 0.72 12/31/2015 1036   CREATININE 0.7 07/24/2015 0957      Component Value Date/Time   CALCIUM 9.4 12/31/2015 1036   CALCIUM 9.3 07/24/2015 0957   ALKPHOS 67 12/31/2015 1036   ALKPHOS 79 07/24/2015 0957   AST 24 12/31/2015 1036   AST 20 07/24/2015 0957   ALT 12* 12/31/2015 1036   ALT 15 07/24/2015 0957   BILITOT 0.6 12/31/2015 1036   BILITOT 0.88 07/24/2015 0957      STUDIES: Dg Chest 2 View  12/31/2015  CLINICAL DATA:  Cough for 2 months, has been out of country in [place], history breast cancer with incomplete treatment, hypertension, GERD EXAM: CHEST  2 VIEW COMPARISON:  04/14/2014; correlation CT chest 08/04/2014 FINDINGS: Borderline enlargement of cardiac silhouette. Normal mediastinal contours. Peribronchial thickening with somewhat patchy nodular appearing opacities throughout both lungs, could represent infiltrate but tumor spread not excluded. Tiny pleural effusions blunt the posterior costophrenic  angles. No osseous lesions identified. IMPRESSION: Peribronchial thickening with patchy somewhat nodular appearing infiltrates throughout both lungs, may represent infection but metastatic disease not completely excluded; further assessment by CT chest with contrast recommended. Electronically Signed   By: Lavonia Dana M.D.   On: 12/31/2015 11:14   Ct Chest W Contrast  12/31/2015  CLINICAL DATA:  Persistent cough for 2 months. History of right breast cancer. EXAM: CT CHEST, ABDOMEN, AND PELVIS WITH CONTRAST TECHNIQUE: Multidetector CT  imaging of the chest, abdomen and pelvis was performed following the standard protocol during bolus administration of intravenous contrast. CONTRAST:  149m ISOVUE-300 IOPAMIDOL (ISOVUE-300) INJECTION 61% COMPARISON:  CT scan of chest of August 04, 2014. CT scan of abdomen and pelvis of March 03, 2014. FINDINGS: CT CHEST No pneumothorax is noted. Minimal bilateral pleural effusions are noted. Multiple diffuse pulmonary nodules are noted bilaterally consistent with metastatic disease. New 2.6 x 2.2 cm left perihilar mass is noted concerning for metastatic disease. Precarinal lymph node measuring 2.6 x 1.5 cm is noted consistent with metastatic disease. Thoracic aorta appears unremarkable. Left axillary adenopathy measuring 19 x 13 mm is noted. Right internal jugular Port-A-Cath noted on prior exam has been removed. Thickening of the scan of right breast is noted, with new 2.0 x 1.8 cm mass seen in right axillary tail. These findings are concerning for recurrent malignancy. No significant osseous abnormality is noted in the chest. CT ABDOMEN AND PELVIS No gallstones are noted. The liver, spleen and pancreas appear normal. Adrenal glands and kidneys appear normal. No hydronephrosis or renal obstruction is noted. There is no evidence of bowel obstruction. No abnormal fluid collection is noted. Uterus and ovaries are unremarkable. Urinary bladder appears normal. No significant adenopathy is  noted. Degenerative changes are noted in the lower lumbar spine. IMPRESSION: There is interval development of nodular skin thickening of the right breast with new 2 cm mass seen in right axillary tail consistent with recurrent breast malignancy. There is also noted interval development of mediastinal and left axillary metastatic adenopathy, with diffuse bilateral pulmonary metastases. No evidence of metastatic disease is noted in the abdomen or pelvis. Electronically Signed   By: JMarijo Conception M.D.   On: 12/31/2015 13:37   Ct Abdomen Pelvis W Contrast  12/31/2015  CLINICAL DATA:  Persistent cough for 2 months. History of right breast cancer. EXAM: CT CHEST, ABDOMEN, AND PELVIS WITH CONTRAST TECHNIQUE: Multidetector CT imaging of the chest, abdomen and pelvis was performed following the standard protocol during bolus administration of intravenous contrast. CONTRAST:  1070mISOVUE-300 IOPAMIDOL (ISOVUE-300) INJECTION 61% COMPARISON:  CT scan of chest of August 04, 2014. CT scan of abdomen and pelvis of March 03, 2014. FINDINGS: CT CHEST No pneumothorax is noted. Minimal bilateral pleural effusions are noted. Multiple diffuse pulmonary nodules are noted bilaterally consistent with metastatic disease. New 2.6 x 2.2 cm left perihilar mass is noted concerning for metastatic disease. Precarinal lymph node measuring 2.6 x 1.5 cm is noted consistent with metastatic disease. Thoracic aorta appears unremarkable. Left axillary adenopathy measuring 19 x 13 mm is noted. Right internal jugular Port-A-Cath noted on prior exam has been removed. Thickening of the scan of right breast is noted, with new 2.0 x 1.8 cm mass seen in right axillary tail. These findings are concerning for recurrent malignancy. No significant osseous abnormality is noted in the chest. CT ABDOMEN AND PELVIS No gallstones are noted. The liver, spleen and pancreas appear normal. Adrenal glands and kidneys appear normal. No hydronephrosis or renal  obstruction is noted. There is no evidence of bowel obstruction. No abnormal fluid collection is noted. Uterus and ovaries are unremarkable. Urinary bladder appears normal. No significant adenopathy is noted. Degenerative changes are noted in the lower lumbar spine. IMPRESSION: There is interval development of nodular skin thickening of the right breast with new 2 cm mass seen in right axillary tail consistent with recurrent breast malignancy. There is also noted interval development of mediastinal and left axillary metastatic adenopathy,  with diffuse bilateral pulmonary metastases. No evidence of metastatic disease is noted in the abdomen or pelvis. Electronically Signed   By: Marijo Conception, M.D.   On: 12/31/2015 13:37    ASSESSMENT: 45 y.o. [place] woman [place] from [place]  (1)   status post right breast and right axillary lymph node biopsy 01/20/2014, both positive for a clinically T3 N1-2, clinical stage III a invasive ductal carcinoma, grade 2, estrogen and progesterone receptor negative, HER-2 positive, with an MIB-1 of 85%  (2)  treated neoadjuvantly with 4 dose dense cycles of doxorubicin/cyclophosphamide completed 03/25/2014,  followed by weekly paclitaxel/ carboplatin x12, given along with trastuzumab/ pertuzumab every 3 weeks prior to definitive surgery.   (a) all 4 drugs given with cycle 1, carboplatin was held starting with the second cycle because of poor tolerance  (b) day 1 cycle 3 (paclitaxel only) delayed one week at patient's request (no trastuzumab or pertuzumab given on this day).  (c) stopped after cycle 3 per patient preference  (3)  trastuzumab alone given once on 08/19/14 then stopped.   (4) right modified radical mastectomy, followed by postmastectomy radiation was planned, but refused by patient  METASTATIC DISEASE: (5) CT scan of the chest/abdomen/pelvis 12/31/2015 shows bilateral lung masses, with right breast, left axillary and mediastinal involvement,  but no liver or bone involvement  (a) brain MRI pending  (b) echo pending  (c) to start T-DM1 vs Hospice referral  PLAN: I reviewed the results of the CT scan with Apolonio Schneiders. 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 She was explained that there is no cure for this stage of disease for breast cancer. What we can do is hope to control the current tumors with treatment. We are considering use of T-DM1 which is a form of immunotherapy, like trastuzumab with a chemo molecule attached. She would receive this every 3 weeks. If she prefers not to proceed with treatment, as she has done in the past, we can implement comfort measure and dispatch in-home palliative care. We would not treat the disease, but would manage any symptoms that made her uncomfortable with the goal of keeping her as fit as possible.   Currently, Kameren would like to weigh her options. She is enticed by the idea of immunotherapy vs chemotherapy. She understands that this is not a guaranteed fix. She is more so interested in shrinking the lesions to her breast to allow for possible surgical intervention in the future.   I offered her a prescription for cough medicine, but she will stick with the tessalon capsules. Anything in syrup form ends up being too sweet for her.   Georganne will return for follow up in 2 weeks. During this time she will share her decision. She understands that we support her either way. She understands and agrees with this plan. She has been encouraged to call with any issues that might arise before her next visit here.   Laurie Panda, NP   ADDENDUM: Kamala's breast cancer is back, and it is now metastatic. Unfortunately this is not curable. I gave her that information in writing. She understands the goal of treatment from this point is control. If we can control the cancer so that it does not grow any further with a minimum of quality of life problems for her, that would be  the best.  She has the possibility of achieving this with T-DM 1. In younger patients do very well on this  medication, with mild fatigue and some liver function test abnormalities is the most common problems. There generally is no nausea, hair loss, or low blood counts. This was also discussed in detail today. Eilleen I could not make a decision. She is going to think about this and prior about this and let us know as soon as she knows which way she would like to go. If she does not want to receive life prolonging treatment I would favor a hospice referral or at least an in-home palliative care referral so that she could start becoming acquainted with that organization and receive the support she needs.  She will see me again in 2 weeks.   I personally saw this patient and performed a substantive portion of this encounter with the listed APP documented above.   Chauncey Cruel, MD Medical Oncology and Hematology [facility] [address] [place],  [address] Tel. [phone]    Fax. [phone]

## 2016-01-15 ENCOUNTER — Ambulatory Visit (HOSPITAL_BASED_OUTPATIENT_CLINIC_OR_DEPARTMENT_OTHER): Payer: Self-pay | Admitting: Nurse Practitioner

## 2016-01-15 ENCOUNTER — Encounter: Payer: Self-pay | Admitting: Nurse Practitioner

## 2016-01-15 ENCOUNTER — Telehealth: Payer: Self-pay | Admitting: Nurse Practitioner

## 2016-01-15 VITALS — BP 112/74 | HR 72 | Temp 98.4°F | Resp 18 | Wt 177.1 lb

## 2016-01-15 DIAGNOSIS — Z171 Estrogen receptor negative status [ER-]: Secondary | ICD-10-CM

## 2016-01-15 DIAGNOSIS — C773 Secondary and unspecified malignant neoplasm of axilla and upper limb lymph nodes: Secondary | ICD-10-CM

## 2016-01-15 DIAGNOSIS — C50811 Malignant neoplasm of overlapping sites of right female breast: Secondary | ICD-10-CM

## 2016-01-15 DIAGNOSIS — C7802 Secondary malignant neoplasm of left lung: Secondary | ICD-10-CM

## 2016-01-15 DIAGNOSIS — C50411 Malignant neoplasm of upper-outer quadrant of right female breast: Secondary | ICD-10-CM

## 2016-01-15 DIAGNOSIS — C7801 Secondary malignant neoplasm of right lung: Secondary | ICD-10-CM

## 2016-01-15 NOTE — Progress Notes (Signed)
[name]  [phone] Fax:([phone]) [phone]   ID: [name] OB: 1971/08/22  MR#: [id number]  UQJ#:[id number]  PCP: Tereasa Coop, PA-C GYN:  Mora Bellman, MD SU: Excell Seltzer, MD OTHER MD: Arloa Koh, MD  CHIEF COMPLAINT: Right breast cancer  CURRENT TREATMENT: none. Immunotherapy pending.  BREAST CANCER HISTORY: From the original intake note:  Rhonda [name] was visiting relatives in [place] about 4 months ago when she noted pain in her right breast. In 2003 she had a right breast infection which felt pretty much the same. She was treated with antibiotics back then (while living in [place]). Accordingly, this time she minimized the symptoms. It felt like milk was coming down she says. She also had breast pain while having her period. Then in February 2015, she actually found 2 lumps in her right breast breast which she initially ignored. Eventually as the symptoms seemed to progress she saw a Dr. In [place] and he told her she likely had breast cancer. She waited until coming [place] the [place] to seek definitive care.  On 12/17/2013 she had bilateral diagnostic mammography in [place] [facility]). In the upper outer right breast there were pleomorphic calcifications measuring approximately 9 cm in diameter. There were 4 distinct microlobulated masses in the right breast, 3 of them in the upper outer quadrant one in the lower outer quadrant. Each measures approximately 1.2 cm. Ultrasound showed a complex cystic mass in the superior central right breast, a 4 mm hypoechoic lesion, 3 suspicious solid masses elsewhere in the right breast, all of them hypoechoic and oval are round with increased vascularity. The largest of these masses measure 1.7 cm. Findings in the left breast were not suspicious or specific. The patient was referred to the breast Center where on 01/20/2014 she underwent biopsy of 2 of the right breast masses, and a right axillary lymph node.  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  On 01/28/2014 the patient underwent bilateral breast MRI. This showed, in the right breast, multiple irregular enhancing masses in the setting of clumped non-masslike enhancement the area in question measured up to 9.4 cm. There was diffuse asymmetric skin thickening and edema involving the right breast, but no obvious nipple or pectoralis involvement. There were several enlarged and morphologically abnormal right axillary lymph node as well as inter-pectoral and subpectoral adenopathy there the largest right axillary lymph node measured 2.7 cm. There was no internal mammary lymphadenopathy noted. The left breast was unremarkable.  Her subsequent history is as detailed below.  INTERVAL HISTORY: [name] returns today for follow up of her now metastatic breast cancer. She feels better than she did at her last visit. Her cough has actually improved. It is no longer constant during the day, and it does not bother her at all at night. She has tessalon capsules but does not use them. The masses to her right breast are no bigger than she recalls. A few surrounding the nipple continue to weep. They cause discomfort, but not to the point where she need to take any pain medicine.   REVIEW OF SYSTEMS: She denies fevers, chills, nausea, vomiting, or changes in bowel or bladder habits. She has no headaches, dizziness, or weakness. She is short of breath with exertion, but denies palpitations or cardiac related chest pain. She has no upper or lower extremity edema. Her  energy level is good. Her appetite is healthy. She sleeps well. A detailed review of systems is otherwise stable.   PAST MEDICAL HISTORY: Past Medical History  Diagnosis Date  . Hypertension   . Blood transfusion without reported diagnosis     3  previous   . Anemia   . Ulcer     hx of stomach ulcer a long time ago  . GERD (gastroesophageal reflux disease)     a long time ago  . Headache(784.0)   . Anxiety   . Cancer [facility]) 10/2013    right breast cancer    PAST SURGICAL HISTORY: Past Surgical History  Procedure Laterality Date  . Cesarean section      2 previous  . Back surgery  2000  . Appendectomy      a long time ago in [place]  . Portacath placement N/A 02/06/2014    Procedure: INSERTION PORT-A-CATH;  Surgeon: Edward Jolly, MD;  Location: [facility];  Service: General;  Laterality: N/A;  . Port-a-cath removal Left 03/31/2014    Procedure: REMOVAL PORT-A-CATH;  Surgeon: Edward Jolly, MD;  Location: [facility];  Service: General;  Laterality: Left;  . Portacath placement Right 04/14/2014    Procedure: PLACEMENT OF PORT-A-CATH;  Surgeon: Edward Jolly, MD;  Location: [place];  Service: General;  Laterality: Right;  . Port-a-cath removal N/A 08/16/2014    Procedure: REMOVAL PORT-A-CATH;  Surgeon: Excell Seltzer, MD;  Location: [facility];  Service: General;  Laterality: N/A;    FAMILY HISTORY Family History  Problem Relation Age of Onset  . Hypertension Mother   . Hypertension Father    the patient's parents are living but she is not sure of their age. She had 4 brothers and 4 sisters. 2 other brothers died in the [place] and one brother is currently in [place] but they do not know where are even if he is still alive. She has one brother in this area. There is no history of breast or ovarian cancer in the family to her knowledge.  GYNECOLOGIC HISTORY: (Reviewed 03/18/2014) Menarche somewhere between the ages of 63 and 77. First live birth age 57. The patient is GX P3. Her periods are now irregular. She took birth control pills remotely for approximately one year with no complaints.  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 Her husband  Jacquenette Shone Lol Dalene Carrow is a former Emergency planning/management officer to the [place] from [place] and is a Optometrist. Their children are Duopl (11),Nyamal (8) and Nyewech (4).  The youngest child is currently living with Apolonio Schneiders. The 2 older children are currently in [place]. The patient attends a [employer] day [employer] church locally    ADVANCED DIRECTIVES: Not in place   HEALTH MAINTENANCE: (Updated 03/18/2014) Social History  Substance Use Topics  . Smoking status: Never Smoker   . Smokeless tobacco: Never Used  . Alcohol Use: No     Colonoscopy: Never  PAP: April 2015/Dr. Constant  Bone density: Never  Lipid panel: Not on file  No Known Allergies  Current Outpatient Prescriptions  Medication Sig Dispense Refill  . benzonatate (TESSALON) 100 MG capsule Take 1 capsule (100 mg total) by mouth every 8 (eight) hours. 21 capsule 0  . UNABLE TO FIND Vit D and Vit C in herb form     No current facility-administered medications for this visit.    OBJECTIVE: middle-aged African woman who appears as stated age    51  Vitals:   01/15/16 1336  BP: 112/74  Pulse: 72  Temp: 98.4 F (36.9 C)  Resp: 18   Filed Weights   01/15/16 1336  Weight: 177 lb 1.6 oz (80.332 kg)    Skin: warm, dry  HEENT: sclerae anicteric, conjunctivae pink, oropharynx clear. No thrush or mucositis.  Lymph Nodes: No cervical or supraclavicular lymphadenopathy  Lungs: clear to auscultation bilaterally, no rales, wheezes, or rhonci  Heart: regular rate and rhythm  Abdomen: round, soft, non tender, positive bowel sounds  Musculoskeletal: No focal spinal tenderness, no peripheral edema  Neuro: non focal, well oriented, positive affect  Breasts: Multiple masses to all 4 quadrants palpated to right breast ranging from 1cm-3cm. In particular there are two 1cm lesions just above the nipple that are open with dried discharge. Right axilla benign. No palpable adenopathy to either axilla . Left breast unremarkable. (patient declined picture to  be placed in chart).  LAB RESULTS:   Lab Results  Component Value Date   WBC 4.9 12/31/2015   NEUTROABS 2.1 12/31/2015   HGB 12.5 12/31/2015   HCT 38.5 12/31/2015   MCV 69.1* 12/31/2015   PLT 277 12/31/2015      Chemistry      Component Value Date/Time   NA 141 12/31/2015 1036   NA 142 07/24/2015 0957   K 4.4 12/31/2015 1036   K 4.3 07/24/2015 0957   CL 107 12/31/2015 1036   CO2 26 12/31/2015 1036   CO2 28 07/24/2015 0957   BUN 11 12/31/2015 1036   BUN 8.3 07/24/2015 0957   CREATININE 0.72 12/31/2015 1036   CREATININE 0.7 07/24/2015 0957      Component Value Date/Time   CALCIUM 9.4 12/31/2015 1036   CALCIUM 9.3 07/24/2015 0957   ALKPHOS 67 12/31/2015 1036   ALKPHOS 79 07/24/2015 0957   AST 24 12/31/2015 1036   AST 20 07/24/2015 0957   ALT 12* 12/31/2015 1036   ALT 15 07/24/2015 0957   BILITOT 0.6 12/31/2015 1036   BILITOT 0.88 07/24/2015 0957      STUDIES: Dg Chest 2 View  12/31/2015  CLINICAL DATA:  Cough for 2 months, has been out of country in [place], history breast cancer with incomplete treatment, hypertension, GERD EXAM: CHEST  2 VIEW COMPARISON:  04/14/2014; correlation CT chest 08/04/2014 FINDINGS: Borderline enlargement of cardiac silhouette. Normal mediastinal contours. Peribronchial thickening with somewhat patchy nodular appearing opacities throughout both lungs, could represent infiltrate but tumor spread not excluded. Tiny pleural effusions blunt the posterior costophrenic angles. No osseous lesions identified. IMPRESSION: Peribronchial thickening with patchy somewhat nodular appearing infiltrates throughout both lungs, may represent infection but metastatic disease not completely excluded; further assessment by CT chest with contrast recommended. Electronically Signed   By: Lavonia Dana M.D.   On: 12/31/2015 11:14   Ct Chest W Contrast  12/31/2015  CLINICAL DATA:  Persistent cough for 2 months. History of right breast cancer. EXAM: CT CHEST, ABDOMEN, AND  PELVIS WITH CONTRAST TECHNIQUE: Multidetector CT imaging of the chest, abdomen and pelvis was performed following the standard protocol during bolus administration of intravenous contrast. CONTRAST:  160m ISOVUE-300 IOPAMIDOL (ISOVUE-300) INJECTION 61% COMPARISON:  CT scan of chest of August 04, 2014. CT scan of abdomen and pelvis of March 03, 2014. FINDINGS: CT CHEST No pneumothorax is noted. Minimal bilateral pleural effusions are noted. Multiple diffuse pulmonary nodules are noted bilaterally consistent with metastatic disease. New 2.6 x 2.2 cm left perihilar mass is noted concerning for metastatic disease. Precarinal lymph node  measuring 2.6 x 1.5 cm is noted consistent with metastatic disease. Thoracic aorta appears unremarkable. Left axillary adenopathy measuring 19 x 13 mm is noted. Right internal jugular Port-A-Cath noted on prior exam has been removed. Thickening of the scan of right breast is noted, with new 2.0 x 1.8 cm mass seen in right axillary tail. These findings are concerning for recurrent malignancy. No significant osseous abnormality is noted in the chest. CT ABDOMEN AND PELVIS No gallstones are noted. The liver, spleen and pancreas appear normal. Adrenal glands and kidneys appear normal. No hydronephrosis or renal obstruction is noted. There is no evidence of bowel obstruction. No abnormal fluid collection is noted. Uterus and ovaries are unremarkable. Urinary bladder appears normal. No significant adenopathy is noted. Degenerative changes are noted in the lower lumbar spine. IMPRESSION: There is interval development of nodular skin thickening of the right breast with new 2 cm mass seen in right axillary tail consistent with recurrent breast malignancy. There is also noted interval development of mediastinal and left axillary metastatic adenopathy, with diffuse bilateral pulmonary metastases. No evidence of metastatic disease is noted in the abdomen or pelvis. Electronically Signed   By: Marijo Conception, M.D.   On: 12/31/2015 13:37   Ct Abdomen Pelvis W Contrast  12/31/2015  CLINICAL DATA:  Persistent cough for 2 months. History of right breast cancer. EXAM: CT CHEST, ABDOMEN, AND PELVIS WITH CONTRAST TECHNIQUE: Multidetector CT imaging of the chest, abdomen and pelvis was performed following the standard protocol during bolus administration of intravenous contrast. CONTRAST:  128m ISOVUE-300 IOPAMIDOL (ISOVUE-300) INJECTION 61% COMPARISON:  CT scan of chest of August 04, 2014. CT scan of abdomen and pelvis of March 03, 2014. FINDINGS: CT CHEST No pneumothorax is noted. Minimal bilateral pleural effusions are noted. Multiple diffuse pulmonary nodules are noted bilaterally consistent with metastatic disease. New 2.6 x 2.2 cm left perihilar mass is noted concerning for metastatic disease. Precarinal lymph node measuring 2.6 x 1.5 cm is noted consistent with metastatic disease. Thoracic aorta appears unremarkable. Left axillary adenopathy measuring 19 x 13 mm is noted. Right internal jugular Port-A-Cath noted on prior exam has been removed. Thickening of the scan of right breast is noted, with new 2.0 x 1.8 cm mass seen in right axillary tail. These findings are concerning for recurrent malignancy. No significant osseous abnormality is noted in the chest. CT ABDOMEN AND PELVIS No gallstones are noted. The liver, spleen and pancreas appear normal. Adrenal glands and kidneys appear normal. No hydronephrosis or renal obstruction is noted. There is no evidence of bowel obstruction. No abnormal fluid collection is noted. Uterus and ovaries are unremarkable. Urinary bladder appears normal. No significant adenopathy is noted. Degenerative changes are noted in the lower lumbar spine. IMPRESSION: There is interval development of nodular skin thickening of the right breast with new 2 cm mass seen in right axillary tail consistent with recurrent breast malignancy. There is also noted interval development of  mediastinal and left axillary metastatic adenopathy, with diffuse bilateral pulmonary metastases. No evidence of metastatic disease is noted in the abdomen or pelvis. Electronically Signed   By: JMarijo Conception M.D.   On: 12/31/2015 13:37    ASSESSMENT: 45[place]  (1)   status post right breast and right axillary lymph node biopsy 01/20/2014, both positive for a clinically T3 N1-2, clinical stage III a invasive ductal carcinoma, grade 2, estrogen and progesterone receptor negative, HER-2 positive, with an MIB-1 of 85%  (  2)  treated neoadjuvantly with 4 dose dense cycles of doxorubicin/cyclophosphamide completed 03/25/2014,  followed by weekly paclitaxel/ carboplatin x12, given along with trastuzumab/ pertuzumab every 3 weeks prior to definitive surgery.   (a) all 4 drugs given with cycle 1, carboplatin was held starting with the second cycle because of poor tolerance  (b) day 1 cycle 3 (paclitaxel only) delayed one week at patient's request (no trastuzumab or pertuzumab given on this day).  (c) stopped after cycle 3 per patient preference  (3)  trastuzumab alone given once on 08/19/14 then stopped.   (4) right modified radical mastectomy, followed by postmastectomy radiation was planned, but refused by patient  METASTATIC DISEASE: (5) CT scan of the chest/abdomen/pelvis 12/31/2015 shows bilateral lung masses, with right breast, left axillary and mediastinal involvement, but no liver or bone involvement  (a) brain MRI pending  (b) echo pending  (c) to start T-DM1 vs Hospice referral  PLAN: Janith and I discussed treatment options. She would like to wait for her husband to return to [place] before the final decision is make. He should be home next week for her son's 8th grade graduation. She shared with me that she is leaning towards proceeding with T-DM1, but would prefer that her husband concur with her before she moved forward.   Honestly her  ultimate concern is the discomfort to the right breast because of the large masses close to the skin surface. A few have a small discharge, but I do not believe they are "fluid filled" in the way that she thinks. Hypothetically if a needle were inserted to draw fluid out the way she suggests, we would not come up with much of any liquid. I explained that she has pockets of cancerous tissue that happens to leak some when it breaks through the surface of her skin. I consulted with Dr. Jana Hakim and he suggests placing a palliative radiation referral in for Dr. Pablo Ledger to evaluate the area. She may find relief from this therapy.   Lindsi will meet with Dr. Pablo Ledger next Wednesday and speak with her husband when he arrives. I am scheduling a follow up visit with me on 5/12. At that time she will decide whether or not we are proceeding with T-DM1 or an in-home hospice referral. She understands and agrees with this plan. She knows the goal of treatment in her case is control. She has been encouraged to call with any issues that might arise before her next visit here.  Laurie Panda 01/15/2016 2:37 PM

## 2016-01-15 NOTE — Telephone Encounter (Signed)
Gave patient avs report and appointments for May including 5/3 visit with Dr. Pablo Ledger.

## 2016-01-25 ENCOUNTER — Encounter (HOSPITAL_COMMUNITY): Payer: Self-pay | Admitting: Emergency Medicine

## 2016-01-25 ENCOUNTER — Emergency Department (HOSPITAL_COMMUNITY)
Admission: EM | Admit: 2016-01-25 | Discharge: 2016-01-25 | Disposition: A | Discharge status: Home / Self Care | Payer: Self-pay | Attending: Emergency Medicine | Admitting: Emergency Medicine

## 2016-01-25 DIAGNOSIS — Z79899 Other long term (current) drug therapy: Secondary | ICD-10-CM | POA: Insufficient documentation

## 2016-01-25 DIAGNOSIS — K219 Gastro-esophageal reflux disease without esophagitis: Secondary | ICD-10-CM | POA: Insufficient documentation

## 2016-01-25 DIAGNOSIS — R05 Cough: Secondary | ICD-10-CM | POA: Insufficient documentation

## 2016-01-25 DIAGNOSIS — C50911 Malignant neoplasm of unspecified site of right female breast: Secondary | ICD-10-CM | POA: Insufficient documentation

## 2016-01-25 DIAGNOSIS — R053 Chronic cough: Secondary | ICD-10-CM

## 2016-01-25 DIAGNOSIS — I10 Essential (primary) hypertension: Secondary | ICD-10-CM | POA: Insufficient documentation

## 2016-01-25 DIAGNOSIS — C7800 Secondary malignant neoplasm of unspecified lung: Secondary | ICD-10-CM | POA: Insufficient documentation

## 2016-01-25 MED ORDER — HYDROCODONE-HOMATROPINE 5-1.5 MG/5ML PO SYRP: 5.0000 mL | ORAL_SOLUTION | Freq: Four times a day (QID) | ORAL | Status: DC | PRN

## 2016-01-25 NOTE — ED Notes (Signed)
Pt states that she has stage 4 breast CA that is in her lung now. States that she has had a cough x 3 days and cannot sleep at night. Alert and oriented.

## 2016-01-25 NOTE — Progress Notes (Addendum)
Location of Breast Cancer:Breast cancer of upper-outer quadrant right female breast  Histology per Pathology Report: Diagnosis  01-20-14 1. Breast, right, needle core biopsy, mass, 9 o'clock - INVASIVE DUCTAL CARCINOMA - DUCTAL CARCINOMA IN SITU. - SEE COMMENT. 2. Breast, right, needle core biopsy, mass, 10 o'clock - INVASIVE DUCTAL CARCINOMA. - SEE COMMENT. 3. Lymph node, needle/core biopsy, right - POSITIVE FOR DUCTAL CARCINOMA. Receptor Status: ER(0% -), PR (0% -), Her2-neu (+), Ki-(85%)  Did patient present with symptoms (if so, please note symptoms) or was this found on screening mammography?: Rhonda [name] found two lumps right breast 10-2013.  Past/Anticipated interventions by surgeon, if ZES:[id number] surgery  Past/Anticipated interventions by medical oncology, if any: Radiation referral  Lymphedema issues, if any: No   Pain issues, if any:  7/10 taking Hydrocodone Having bleeding from breast 3 to four area nipple pus that comes out.  SAFETY ISSUES:  Prior radiation?:No  Pacemaker/ICD? :No  Possible current pregnancy?:No  Is the patient on methotrexate?:No  Current Complaints / other details: Here to discuss radiation treatment  Menarche [age], G5,P3, Menopause 42, BC 1 year , HRT No Coughing taking Hycodan syrup in ER yesterday Rhonda Spurling, RN 01/25/2016,9:47 AM

## 2016-01-25 NOTE — ED Provider Notes (Signed)
CSN: [id number]     Arrival date & time 01/25/16  1924 History   First MD Initiated Contact with Patient 01/25/16 2121     Chief Complaint  Patient presents with  . Cough  . Cancer     (Consider location/radiation/quality/duration/timing/severity/associated sxs/prior Treatment) HPI Comments: Patient is a 45 year old female with a history of metastatic breast cancer currently stage IV with metastases to the lungs presenting today with ongoing cough. Patient recently returned from [place] where she had been visiting family and was evaluated in the emergency room at the beginning of April and found to have extensive metastatic disease from breast cancer. She did not have complete treatment with chemotherapy and did not have the recommended mastectomy earlier on in her diagnosis. Patient is following up with oncology and is meeting with them later this week for more palliative measures in hopes to shrink the tumors and then maybe start back on chemotherapy. She comes tonight because in the last 3 days the coughing has gotten so bad she is unable to rest. She has tried albuterol, Tessalon Perles and an antibiotic without improvement. She denies any infectious symptoms. No fever, no productive cough, no hemoptysis. She denies any shortness of breath or chest pain. The only pain she has in her chest is from her breast when lesions are breaking open and bleeding.  Patient is a 45 y.o. female presenting with cough. The history is provided by the patient.  Cough Cough characteristics:  Non-productive Severity:  Severe Onset quality:  Gradual Duration: months. Timing:  Constant Progression:  Worsening Chronicity:  Chronic Smoker: no   Associated symptoms: no chest pain, no fever, no rash, no shortness of breath and no wheezing     Past Medical History  Diagnosis Date  . Hypertension   . Blood transfusion without reported diagnosis     3 previous   . Anemia   . Ulcer     hx of stomach ulcer a long  time ago  . GERD (gastroesophageal reflux disease)     a long time ago  . Headache(784.0)   . Anxiety   . Cancer [facility]) 10/2013    right breast cancer   Past Surgical History  Procedure Laterality Date  . Cesarean section      2 previous  . Back surgery  2000  . Appendectomy      a long time ago in [place]  . Portacath placement N/A 02/06/2014    Procedure: INSERTION PORT-A-CATH;  Surgeon: Edward Jolly, MD;  Location: [facility];  Service: General;  Laterality: N/A;  . Port-a-cath removal Left 03/31/2014    Procedure: REMOVAL PORT-A-CATH;  Surgeon: Edward Jolly, MD;  Location: [place];  Service: General;  Laterality: Left;  . Portacath placement Right 04/14/2014    Procedure: PLACEMENT OF PORT-A-CATH;  Surgeon: Edward Jolly, MD;  Location: [place];  Service: General;  Laterality: Right;  . Port-a-cath removal N/A 08/16/2014    Procedure: REMOVAL PORT-A-CATH;  Surgeon: Excell Seltzer, MD;  [facility];  Service: General;  Laterality: N/A;   Family History  Problem Relation Age of Onset  . Hypertension Mother   . Hypertension Father    Social History  Substance Use Topics  . Smoking status: Never Smoker   . Smokeless tobacco: Never Used  . Alcohol Use: No   OB History    Gravida Para Term Preterm AB TAB SAB Ectopic Multiple Living   5 3 3  2   2  5     Review of Systems  Constitutional: Negative for fever.  Respiratory: Positive for cough. Negative for shortness of breath and wheezing.   Cardiovascular: Negative for chest pain.  Skin: Negative for rash.  All other systems reviewed and are negative.     Allergies  Review of patient's allergies indicates no known allergies.  Home Medications   Prior to Admission medications   Medication Sig Start Date End Date Taking? Authorizing Provider  benzonatate (TESSALON) 100 MG capsule Take 1 capsule (100 mg total) by mouth every 8 (eight) hours. Patient not taking:  Reported on 01/25/2016 12/31/15   Jola Schmidt, MD   BP 155/100 mmHg  Pulse 62  Temp(Src) 98.1 F (36.7 C) (Oral)  Resp 18  SpO2 100% Physical Exam  Constitutional: She is oriented to person, place, and time. She appears well-developed and well-nourished. No distress.  HENT:  Head: Normocephalic and atraumatic.  Mouth/Throat: Oropharynx is clear and moist.  Eyes: Conjunctivae and EOM are normal. Pupils are equal, round, and reactive to light.  Neck: Normal range of motion. Neck supple.  Cardiovascular: Normal rate, regular rhythm and intact distal pulses.   No murmur heard. Pulmonary/Chest: Effort normal and breath sounds normal. No respiratory distress. She has no wheezes. She has no rales.  Lesions over the right breast one open and bleeding.  Ongoing coughing on exam  Abdominal: Soft. She exhibits no distension. There is no tenderness. There is no rebound and no guarding.  Musculoskeletal: Normal range of motion. She exhibits no edema or tenderness.  Neurological: She is alert and oriented to person, place, and time.  Skin: Skin is warm and dry. No rash noted. No erythema.  Psychiatric: She has a normal mood and affect. Her behavior is normal.  Nursing note and vitals reviewed.   ED Course  Procedures (including critical care time) Labs Review Labs Reviewed - No data to display  Imaging Review No results found. I have personally reviewed and evaluated these images and lab results as part of my medical decision-making.   EKG Interpretation None      MDM   Final diagnoses:  Chronic cough    Patient is a very pleasant 45 year old female presenting today with ongoing cough. The cough is a result of metastatic lung disease from primary breast cancer which is stage IV at this time. She states she has not found anything to make the cough better. She has tried inhalers and Best boy. While she was in [place], she was using cough syrup with some improvement. Over the last 3  days the cough has been so bad she has not been able to sleep. She is requesting a medication for cough so that she can get some rest. She denies any change in the cough, fever, hemoptysis or productive cough. She is currently cared for by oncology and has plans to see them this week to start potential palliative radiation and possible chemotherapy.  Vital signs are stable and patient is in no acute distress. Will try Hycodan syrup to see if that helps with her cough. She heard he has an inhaler and Tessalon Perles home.  Blanchie Dessert, MD 01/26/16 [phone]

## 2016-01-25 NOTE — Discharge Instructions (Signed)

## 2016-01-27 ENCOUNTER — Encounter: Payer: Self-pay | Admitting: Radiation Oncology

## 2016-01-27 ENCOUNTER — Ambulatory Visit
Admission: RE | Admit: 2016-01-27 | Discharge: 2016-01-27 | Disposition: A | Discharge status: Home / Self Care | Payer: Self-pay | Source: Ambulatory Visit | Attending: Radiation Oncology | Admitting: Radiation Oncology

## 2016-01-27 VITALS — BP 144/96 | HR 87 | Temp 98.2°F | Resp 18 | Ht 70.0 in | Wt 179.0 lb

## 2016-01-27 DIAGNOSIS — C50411 Malignant neoplasm of upper-outer quadrant of right female breast: Secondary | ICD-10-CM

## 2016-01-27 NOTE — Progress Notes (Signed)
Radiation Oncology         [phone]) [phone] ________________________________  Initial outpatient Consultation - Date: 01/27/2016   Name: Rhonda [name] MRN: [id number]   DOB: 1971-09-21  REFERRING PHYSICIAN: Excell Seltzer, MD  DIAGNOSIS AND STAGE: Breast cancer of upper-outer quadrant of right female breast [facility])   Staging form: Breast, AJCC 7th Edition     Clinical: Stage IV (T3, N1, M1) - Signed by Laurie Panda, NP on 01/01/2016       Staging comments: Staged at breast conference 01/29/14.      Pathologic: No stage assigned - Unsigned  HISTORY OF PRESENT ILLNESS::Rhonda [name] is a 45 y.o. female who noticed right breast pain in 2015 while visiting relatives in [place]. She palpated 2 lumps in her right breast in February 2015 and was told she likely had breast cancer. She waited to seek definitive treatment in the [place]. The patient was referred to the breast center where on 01/20/2014 she underwent biopsy of 2 of the right breast masses, and a right axillary lymph node. These were all positive for invasive ductal carcinoma grade 2, estrogen and progesterone receptor negative, with an MIB-1 of 85%, and HER-2 amplified, the signals ratio being 3.81 and the number per cell 8.95. Her most recent restaging studies on 12/31/2015 showed development of skin thickening in the right breast with a new 2 cm mass in the right axillary tail as well as interval development of mediastinal and left axillary metastatic adenopathy, with diffuse bilateral pulmonary metastases. She has essentially refused any definitive therapy receiving only 3 cycles of chemotherapy in 2015 and a single dose of Herceptin. She has had no treatment since November 2015 and presented in April 2017 with a cough She went to urgent care for this cough and was told she had pneumonia. She then travelled to [place] and [place]. When she came back from vacation, she was told that her scans showed metastatic cancer to lung. The  masses in her breast have enlarged and begun to weep. She is having drainage from her nipple and the breast is uncomfortable. Dr. Jana Hakim offered her TDM1 which she deferred at that time. If she refused that, he recommended hospice referral. He thought that perhaps palliative radiation to her chest wall could help manage this disease.   Today, she denies any lymphedema issues. She reports pain at a 7/10 and is taking hydrocodone for this. She reports bleeding from the breast in 3-4 areas as well as leakage of pus from the nipple.    PREVIOUS RADIATION THERAPY: No  Past medical, social and family history were reviewed in the electronic chart. Review of symptoms was reviewed in the electronic chart. Medications were reviewed in the electronic chart.   PHYSICAL EXAM:  Filed Vitals:   01/27/16 1418  BP: 144/96  Pulse: 87  Temp: 98.2 F (36.8 C)  Resp: 18  .179 lb (81.194 kg).   This is a pleasant female. She is alert and oriented. She has an enlarged right breast with multiple cutaneous skin metastases including two open areas superior and around the nipple.   IMPRESSION: Rhonda [name] is a 45 y.o female with Stage IV (T3, N1, M1) breast cancer of upper-outer quadrant of right female breast.  PLAN:  Today, [name] is leaning towards proceeding with TDM1 treatment if she can find place for her kids this summer which is her greatest concern. She is scheduled to meet with Nira Conn on Friday. If she elects not to proceed with  TDM1 after this meeting, then I will see her back.   I spoke to the patient today regarding her diagnosis and options for treatment. We discussed the equivalence in terms of survival and local failure between mastectomy and breast conservation. We discussed the role of radiation in decreasing local failures in patients who undergo mastectomy and have risk factors for recurrence including positive lymph nodes and/or tumors over 5 cm and/or positive margins. We discussed the  process of simulation and the placement tattoos. We discussed 6 weeks of treatment as an outpatient. We discussed the possibility of asymptomatic lung damage. We discussed the low likelihood of secondary malignancies. We discussed the possible side effects including but not limited to skin redness, fatigue, permanent skin darkening, and chest wall swelling. We discussed increased complications that can occur with reconstruction after radiation.   I spent 25 minutes face to face with the patient and more than 50% of that time was spent in counseling and/or coordination of care.   ------------------------------------------------  Thea Silversmith, MD  This document serves as a record of services personally performed by Thea Silversmith, MD. It was created on her behalf by Jenell Milliner, a trained medical scribe. The creation of this record is based on the scribe's personal observations and the provider's statements to them. This document has been checked and approved by the attending provider.

## 2016-01-28 NOTE — Addendum Note (Signed)
Encounter addended by: Malena Edman, RN on: 01/28/2016  9:48 AM<BR>     Documentation filed: Charges VN

## 2016-02-05 ENCOUNTER — Ambulatory Visit (HOSPITAL_BASED_OUTPATIENT_CLINIC_OR_DEPARTMENT_OTHER): Payer: Self-pay | Admitting: Nurse Practitioner

## 2016-02-05 ENCOUNTER — Encounter: Payer: Self-pay | Admitting: Nurse Practitioner

## 2016-02-05 ENCOUNTER — Other Ambulatory Visit: Payer: Self-pay | Admitting: *Deleted

## 2016-02-05 VITALS — BP 122/80 | HR 68 | Temp 98.5°F | Resp 18 | Ht 70.0 in | Wt 172.6 lb

## 2016-02-05 DIAGNOSIS — C7802 Secondary malignant neoplasm of left lung: Secondary | ICD-10-CM

## 2016-02-05 DIAGNOSIS — C7801 Secondary malignant neoplasm of right lung: Secondary | ICD-10-CM

## 2016-02-05 DIAGNOSIS — C50811 Malignant neoplasm of overlapping sites of right female breast: Secondary | ICD-10-CM

## 2016-02-05 DIAGNOSIS — C773 Secondary and unspecified malignant neoplasm of axilla and upper limb lymph nodes: Secondary | ICD-10-CM

## 2016-02-05 DIAGNOSIS — Z171 Estrogen receptor negative status [ER-]: Secondary | ICD-10-CM

## 2016-02-05 DIAGNOSIS — C50411 Malignant neoplasm of upper-outer quadrant of right female breast: Secondary | ICD-10-CM

## 2016-02-05 MED ORDER — BENZONATATE 100 MG PO CAPS: 100.0000 mg | ORAL_CAPSULE | Freq: Three times a day (TID) | ORAL | Status: DC

## 2016-02-05 NOTE — Progress Notes (Signed)
[place]  Telephone:([phone]) [phone] Fax:([phone]) [phone]   ID: Rhonda [name] OB: Feb 21, 1971  MR#: [id number]  YKZ#:[id number]  PCP: Tereasa Coop, PA-C GYN:  Mora Bellman, MD SU: Excell Seltzer, MD OTHER MD: Arloa Koh, MD  CHIEF COMPLAINT: Right breast cancer  CURRENT TREATMENT: none. Immunotherapy pending.  BREAST CANCER HISTORY: From the original intake note:  Rhonda [name] was visiting relatives in [place] about 4 months ago when she noted pain in her right breast. In 2003 she had a right breast infection which felt pretty much the same. She was treated with antibiotics back then (while living in [place]). Accordingly, this time she minimized the symptoms. It felt like milk was coming down she says. She also had breast pain while having her period. Then in February 2015, she actually found 2 lumps in her right breast breast which she initially ignored. Eventually as the symptoms seemed to progress she saw a Dr. In [place] and he told her she likely had breast cancer. She waited until coming back to the states to seek definitive care.  On 12/17/2013 she had bilateral diagnostic mammography in [facility]). In the upper outer right breast there were pleomorphic calcifications measuring approximately 9 cm in diameter. There were 4 distinct microlobulated masses in the right breast, 3 of them in the upper outer quadrant one in the lower outer quadrant. Each measures approximately 1.2 cm. Ultrasound showed a complex cystic mass in the superior central right breast, a 4 mm hypoechoic lesion, 3 suspicious solid masses elsewhere in the right breast, all of them hypoechoic and oval are round with increased vascularity. The largest of these masses measure 1.7 cm. Findings in the left breast were not suspicious or specific. The patient was referred to the breast Center where on 01/20/2014 she underwent biopsy of 2 of the right breast masses, and a right axillary lymph node.  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  On 01/28/2014 the patient underwent bilateral breast MRI. This showed, in the right breast, multiple irregular enhancing masses in the setting of clumped non-masslike enhancement the area in question measured up to 9.4 cm. There was diffuse asymmetric skin thickening and edema involving the right breast, but no obvious nipple or pectoralis involvement. There were several enlarged and morphologically abnormal right axillary lymph node as well as inter-pectoral and subpectoral adenopathy there the largest right axillary lymph node measured 2.7 cm. There was no internal mammary lymphadenopathy noted. The left breast was unremarkable.  Her subsequent history is as detailed below.  INTERVAL HISTORY: Yeilin returns today for follow up of her now metastatic breast cancer, accompanied by her husband, Jacquenette Shone. He is not in town often, as he is usually on business in [place], but he is here now for 10 days for their sons' graduation. Since her last visit, [name] has been to the emergency department twice (we only have records from the 5/1 visit, but she also claims to have been to another ED on 5/10). She has increased cough and pain to her right upper arm and shoulder with decreased range of motion. She was prescribed percocet and the pain has improved. The narcotics does not constipate her. She continues on tessalon for the cough, wanting avoid use of the hycodan syrup.   Kylin also had a productive consult visit with Dr. Pablo Ledger. She need to make a  decision on systemic therapy first it seems.   REVIEW OF SYSTEMS: She denies fevers, chills, nausea, vomiting, or changes in bowel or bladder habits. She has no headaches, dizziness, or weakness. She is short of breath with exertion, but denies palpitations or  cardiac related chest pain. She has no upper or lower extremity edema. Her energy level is good. Her appetite is healthy. She sleeps well. A detailed review of systems is otherwise stable.   PAST MEDICAL HISTORY: Past Medical History  Diagnosis Date  . Hypertension   . Blood transfusion without reported diagnosis     3 previous   . Anemia   . Ulcer     hx of stomach ulcer a long time ago  . GERD (gastroesophageal reflux disease)     a long time ago  . Headache(784.0)   . Anxiety   . Cancer [facility]) 10/2013    right breast cancer    PAST SURGICAL HISTORY: Past Surgical History  Procedure Laterality Date  . Cesarean section      2 previous  . Back surgery  2000  . Appendectomy      a long time ago in [place]  . Portacath placement N/A 02/06/2014    Procedure: INSERTION PORT-A-CATH;  Surgeon: Edward Jolly, MD;  [facility];  Service: General;  Laterality: N/A;  . Port-a-cath removal Left 03/31/2014    Procedure: REMOVAL PORT-A-CATH;  Surgeon: Edward Jolly, MD;  Location: [place];  Service: General;  Laterality: Left;  . Portacath placement Right 04/14/2014    Procedure: PLACEMENT OF PORT-A-CATH;  Surgeon: Edward Jolly, MD;  Location: [place];  Service: General;  Laterality: Right;  . Port-a-cath removal N/A 08/16/2014    Procedure: REMOVAL PORT-A-CATH;  Surgeon: Excell Seltzer, MD;  [facility];  Service: General;  Laterality: N/A;    FAMILY HISTORY Family History  Problem Relation Age of Onset  . Hypertension Mother   . Hypertension Father    the patient's parents are living but she is not sure of their age. She had 4 brothers and 4 sisters. 2 other brothers died in the turmoil [place] and one brother is currently in [place] but they do not know where are even if he is still alive. She has one brother in this area. There is no history of breast or ovarian cancer in the family to her knowledge.  GYNECOLOGIC  HISTORY: (Reviewed 03/18/2014) Menarche somewhere between the ages of 45 and 33. First live birth age 15. The patient is GX P3. Her periods are now irregular. She took birth control pills remotely for approximately one year with no complaints.  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 Her husband Jacquenette Shone Lol Dalene Carrow is a former Emergency planning/management officer to the [place] from [place] and is a Optometrist. Their children are Duopl (11),Nyamal (8) and Nyewech (4).  The youngest child is currently living with Apolonio Schneiders. The 2 older children are currently in [place]. The patient attends a [employer] church locally    ADVANCED DIRECTIVES: Not in place   HEALTH MAINTENANCE: (Updated 03/18/2014) Social History  Substance Use Topics  . Smoking status: Never Smoker   . Smokeless tobacco: Never Used  . Alcohol Use: No     Colonoscopy: Never  PAP: April 2015/Dr. Constant  Bone density: Never  Lipid panel: Not on file  No Known Allergies  Current Outpatient Prescriptions  Medication Sig Dispense Refill  . oxyCODONE-acetaminophen (PERCOCET/ROXICET) 5-325 MG  tablet Take 1-2 tablets by mouth every 4 (four) hours as needed.     . benzonatate (TESSALON) 100 MG capsule Take 1 capsule (100 mg total) by mouth every 8 (eight) hours. 90 capsule 1  . HYDROcodone-homatropine (HYCODAN) 5-1.5 MG/5ML syrup Take 5 mLs by mouth every 6 (six) hours as needed for cough. (Patient not taking: Reported on 02/05/2016) 120 mL 0   No current facility-administered medications for this visit.    OBJECTIVE: middle-aged African woman who appears as stated age    89 Vitals:   02/05/16 1321  BP: 122/80  Pulse: 68  Temp: 98.5 F (36.9 C)  Resp: 18   Filed Weights   02/05/16 1321  Weight: 172 lb 9.6 oz (78.291 kg)    Exam deferred   LAB RESULTS:   Lab Results  Component Value Date   WBC 4.9 12/31/2015   NEUTROABS 2.1 12/31/2015   HGB 12.5 12/31/2015   HCT 38.5 12/31/2015   MCV 69.1* 12/31/2015    PLT 277 12/31/2015      Chemistry      Component Value Date/Time   NA 141 12/31/2015 1036   NA 142 07/24/2015 0957   K 4.4 12/31/2015 1036   K 4.3 07/24/2015 0957   CL 107 12/31/2015 1036   CO2 26 12/31/2015 1036   CO2 28 07/24/2015 0957   BUN 11 12/31/2015 1036   BUN 8.3 07/24/2015 0957   CREATININE 0.72 12/31/2015 1036   CREATININE 0.7 07/24/2015 0957      Component Value Date/Time   CALCIUM 9.4 12/31/2015 1036   CALCIUM 9.3 07/24/2015 0957   ALKPHOS 67 12/31/2015 1036   ALKPHOS 79 07/24/2015 0957   AST 24 12/31/2015 1036   AST 20 07/24/2015 0957   ALT 12* 12/31/2015 1036   ALT 15 07/24/2015 0957   BILITOT 0.6 12/31/2015 1036   BILITOT 0.88 07/24/2015 0957      STUDIES: No results found.  ASSESSMENT: 45 y.o. [place] woman [place] from [place]  (1)   status post right breast and right axillary lymph node biopsy 01/20/2014, both positive for a clinically T3 N1-2, clinical stage III a invasive ductal carcinoma, grade 2, estrogen and progesterone receptor negative, HER-2 positive, with an MIB-1 of 85%  (2)  treated neoadjuvantly with 4 dose dense cycles of doxorubicin/cyclophosphamide completed 03/25/2014,  followed by weekly paclitaxel/ carboplatin x12, given along with trastuzumab/ pertuzumab every 3 weeks prior to definitive surgery.   (a) all 4 drugs given with cycle 1, carboplatin was held starting with the second cycle because of poor tolerance  (b) day 1 cycle 3 (paclitaxel only) delayed one week at patient's request (no trastuzumab or pertuzumab given on this day).  (c) stopped after cycle 3 per patient preference  (3)  trastuzumab alone given once on 08/19/14 then stopped.   (4) right modified radical mastectomy, followed by postmastectomy radiation was planned, but refused by patient  METASTATIC DISEASE: (5) CT scan of the chest/abdomen/pelvis 12/31/2015 shows bilateral lung masses, with right breast, left axillary and mediastinal involvement,  but no liver or bone involvement  (a) brain MRI pending  (b) echo pending  (c) to start T-DM1 vs Hospice referral  PLAN: Dr. Jana Hakim conducted most of the visit with Apolonio Schneiders and her husband. As he has done with Apolonio Schneiders in the past, he explained that she now has what is considered stage IV disease, with metastasis to the lungs, and that there is no cure for this stage of breast cancer. We can  hope to control or shrink the existing disease with treatment. She had a bad experience with chemotherapy in the past, but she is interested in the idea T-DM1 every 3 weeks. We would get an echocardiogram and place a port before treatment starts.   Tamanika and her husband would like to think about this some more. They were given copies of her scan and office visit notes from this year per her husband's request.  If she prefers not to proceed with treatment as she has done in the past, we can implement comfort measures and dispatch in-home palliative care. We would not treat the disease, but would manage any symptoms that made her uncomfortable with the goal of keeping her as fit as possible.   I refilled her prescription for tessalon today, which has been controlling the cough. She has enough pain medicine for now.   She has assured me that they will call next week with a decision. They did not want to make a return appointment at this time. She understands and agrees with this plan. She knows the goal of treatment in her case is control. She has been encouraged to call with any issues that might arise before her next visit here.  Genelle Gather Boelter 02/05/2016 5:12 PM

## 2016-02-11 ENCOUNTER — Other Ambulatory Visit: Payer: Self-pay | Admitting: *Deleted

## 2016-02-11 MED ORDER — OXYCODONE-ACETAMINOPHEN 5-325 MG PO TABS: 1.0000 | ORAL_TABLET | ORAL | Status: DC | PRN

## 2016-02-29 ENCOUNTER — Telehealth: Payer: Self-pay | Admitting: Nurse Practitioner

## 2016-02-29 ENCOUNTER — Other Ambulatory Visit: Payer: Self-pay | Admitting: Nurse Practitioner

## 2016-02-29 NOTE — Telephone Encounter (Signed)
Called patient to follow up on treatment decision. She informs me that she will be in [place] until this weekend. She will give me a call back when she returns. She has not made a final decision regarding use of T-DM1.

## 2016-03-11 IMAGING — MR MR BREAST BILATERAL W WO CONTRAST
6 of 13 series · 18 of 48 positions shown · IV contrast (Yes)
Comparison: Previous exams

CLINICAL DATA: 43-year-old female with recently diagnosed invasive
ductal carcinoma in the right breast (2 sites of malignancy biopsied
which span 10 cm apart) as well as axillary lymph node metastases.

LABS:  Most recent serum creatinine 0.7 milligrams/deciliter.
EXAM:
BILATERAL BREAST MRI WITH AND WITHOUT CONTRAST
TECHNIQUE: Multiplanar, multisequence MR images of both breasts were obtained
prior to and following the intravenous administration of 17ml of
MultiHance.

[Series 3: T2 · axial · 3.0mm · 0.66mm/px · 1 of 59 slices shown]
[im 1/59]
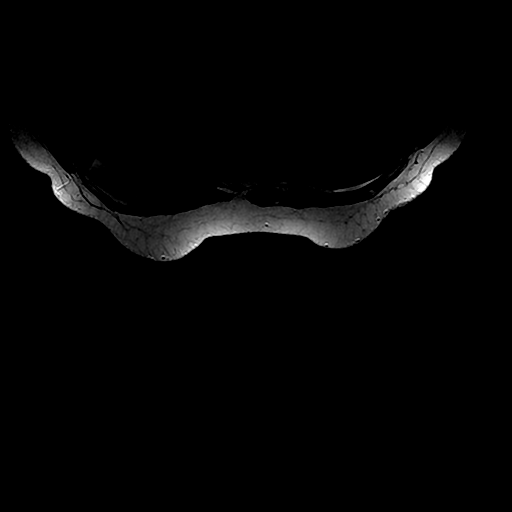

[Series 4: T1 · axial · 1.8mm · 0.66mm/px · z∈[-78,+120]mm · 5 of 220 slices shown]
[im 1/220]
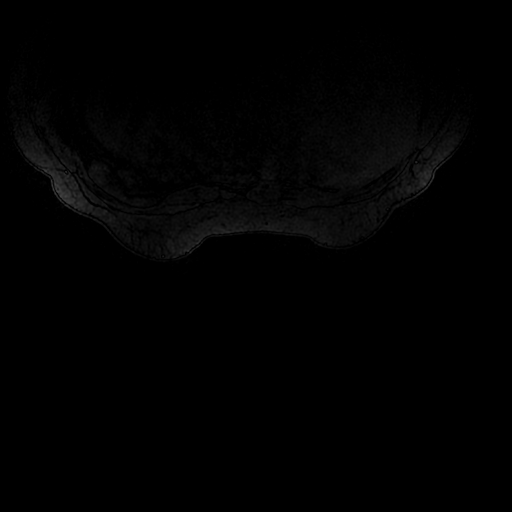
[im 55/220]
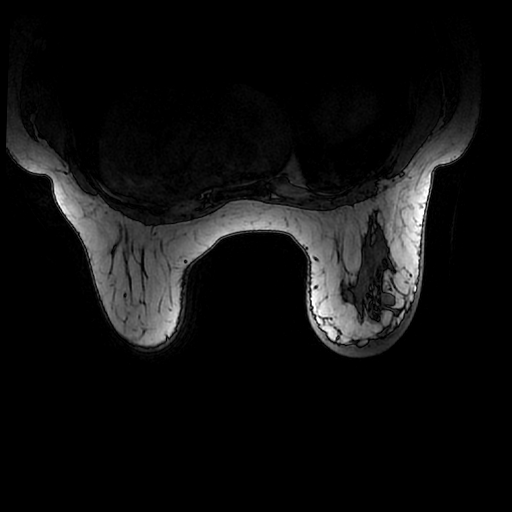
[im 110/220]
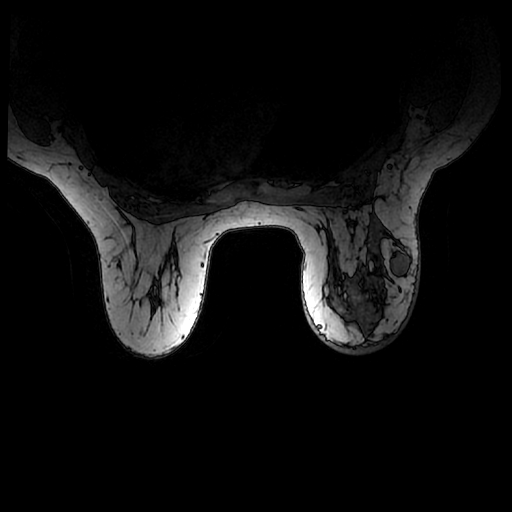
[im 165/220]
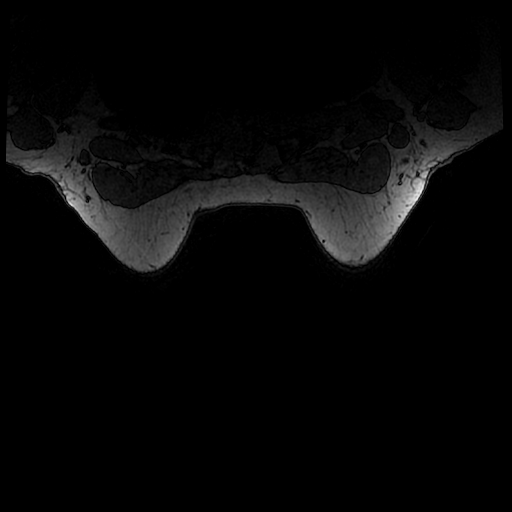
[im 220/220]
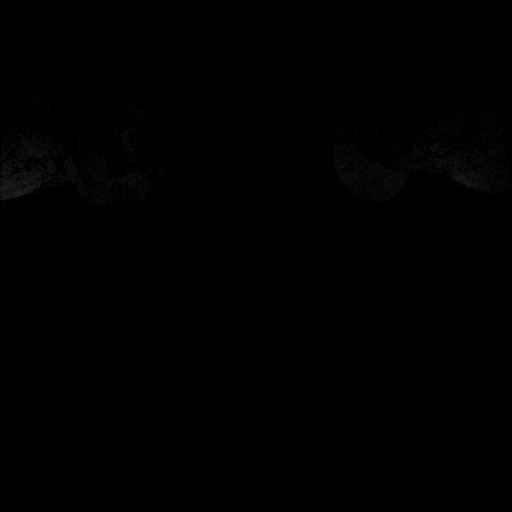

[Series 5: ax ir · axial · 3.0mm · 0.66mm/px · 1 of 65 slices shown]
[im 1/65]
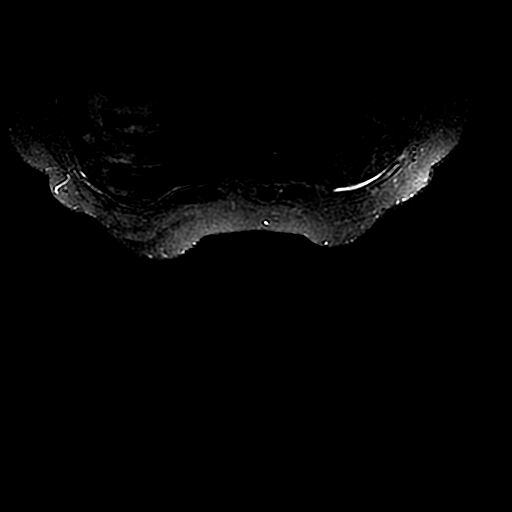

[Series 700: vibrant mph +c · axial · 1.8mm · 0.66mm/px · z∈[-78,+120]mm · 5 of 220 slices shown]
[im 1/220]
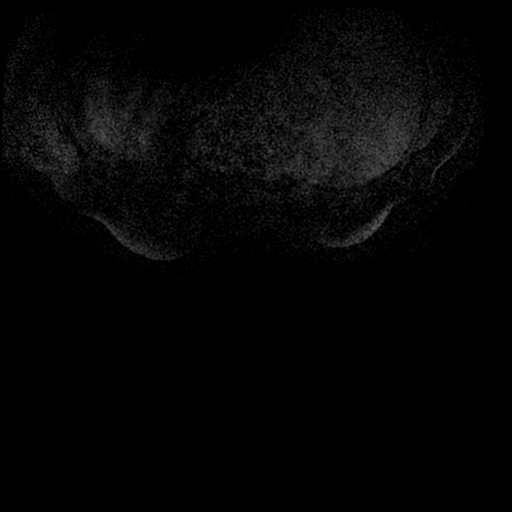
[im 55/220]
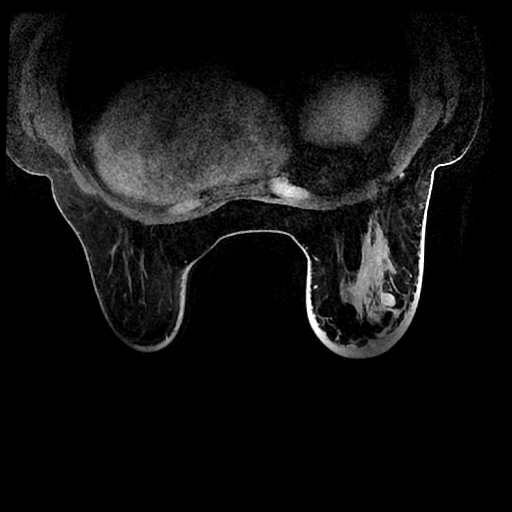
[im 110/220]
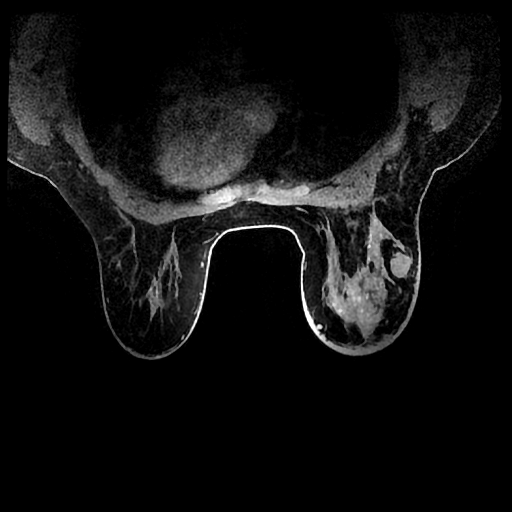
[im 165/220]
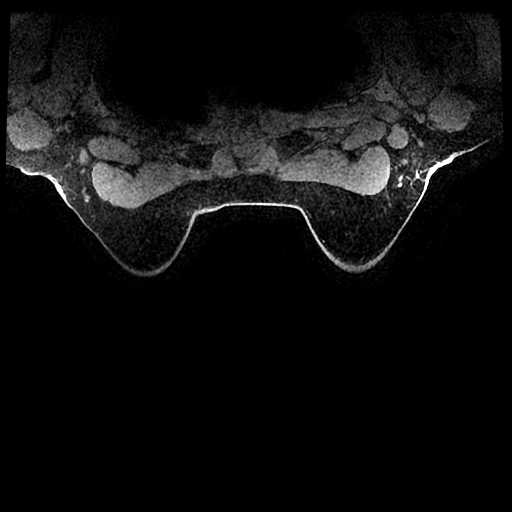
[im 220/220]
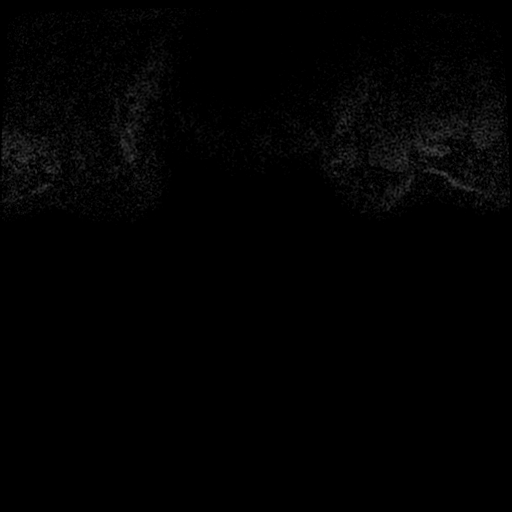

[Series 701: ph1/ vibrant mph · axial · 1.8mm · 0.66mm/px · z∈[-78,+120]mm · 5 of 220 slices shown]
[im 1/220]
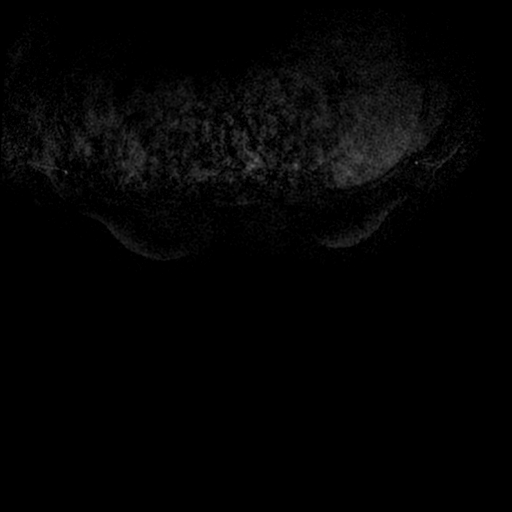
[im 55/220]
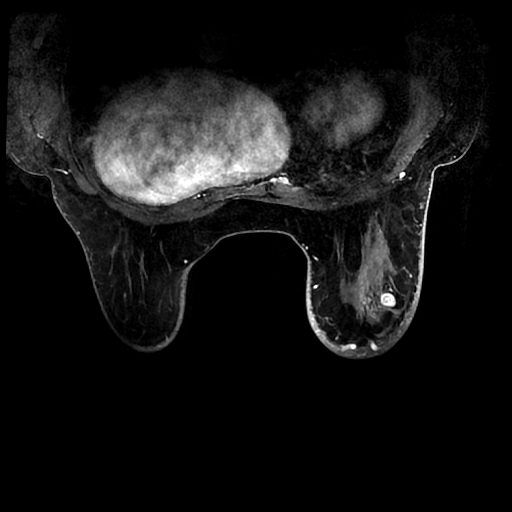
[im 110/220]
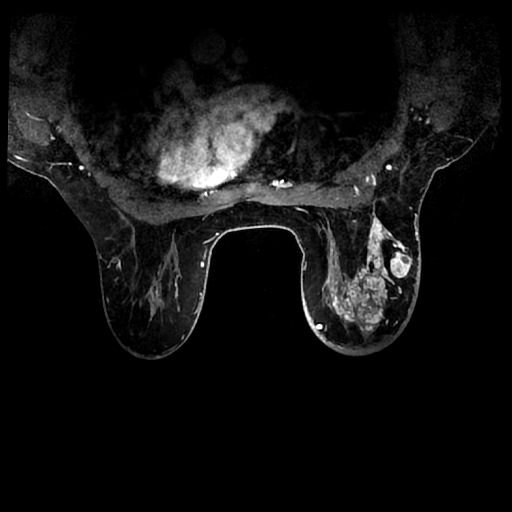
[im 165/220]
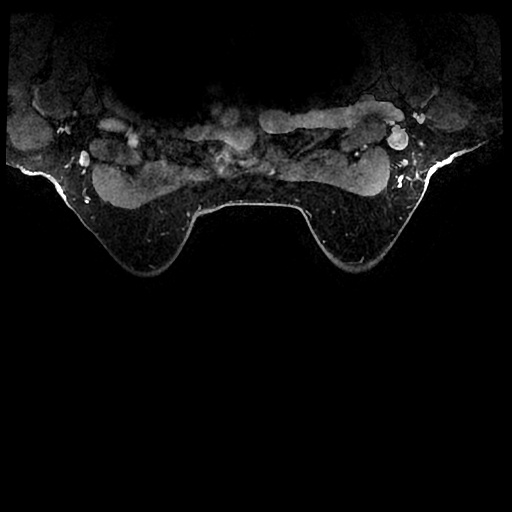
[im 220/220]
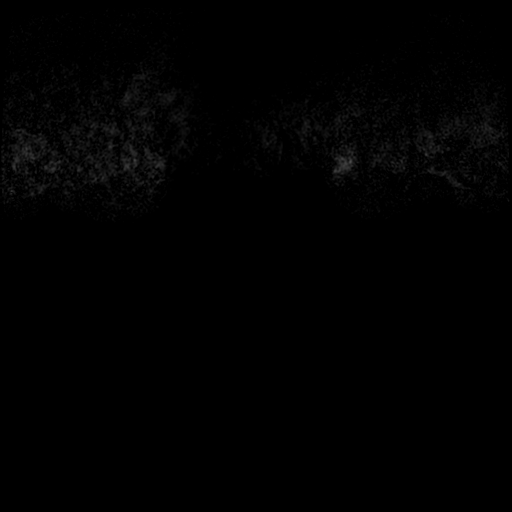

[Series 702: ph2/ vibrant mph · axial · 1.8mm · 0.66mm/px · 1 of 220 slices shown]
[im 1/220]
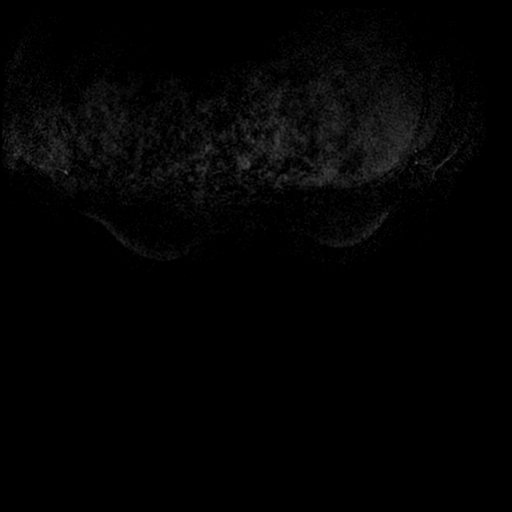

[18 of 48 positions shown; findings below may reference images not displayed]

THREE-DIMENSIONAL MR IMAGE RENDERING ON INDEPENDENT WORKSTATION:

Three-dimensional MR images were rendered by post-processing of the
original MR data on an independent workstation. The
three-dimensional MR images were interpreted, and findings are
reported in the following complete MRI report for this study. Three
dimensional images were evaluated at the independent [employer]
workstation
FINDINGS: Breast composition: c:  Heterogeneous fibroglandular tissue

Background parenchymal enhancement: Moderate

Right breast: Multiple irregular enhancing masses with associated
contiguous and surrounding areas of clumped non mass enhancement are
seen throughout the central, outer, and upper-outer right breast
compatible with known biopsy proven malignancy. This measures up to
9.4 cm in greatest AP dimension, approximately 5 cm transverse, and
up to 7 cm craniocaudal. There is diffuse asymmetric skin thickening
as well as edema involving the right breast. No obvious nipple
involvement. No pectoralis involvement.

Left breast: No suspicious rapidly enhancing masses or abnormal
areas of enhancement in the left breast to suggest malignancy. A
small oval circumscribed mass in the upper-outer left breast
measuring 0.5 x 0.6 x 0.6 cm corresponds with a benign appearing
lymph node seen on the recent outside ultrasound.

Lymph nodes: Several enlarged and morphologically abnormal right
axillary, interpectoral, and subpectoral lymph nodes are identified
compatible with known biopsy proven axillary metastases, with a
representative enlarged right axillary lymph node measuring up to
2.7 cm AP. No internal mammary lymphadenopathy seen.

Ancillary findings:  None.
IMPRESSION: Extensive malignancy involving predominately the central, outer, and
upper-outer right breast measuring up to 9.4 cm in greatest AP
dimension with associated right axillary (biopsy proven),
interpectoral, and subpectoral lymphadenopathy.

RECOMMENDATION:
Treatment for known biopsy proven malignancy in the right breast.

BI-RADS CATEGORY  6: Known biopsy-proven malignancy.

## 2016-03-22 ENCOUNTER — Encounter: Payer: Self-pay | Admitting: Oncology

## 2016-04-01 IMAGING — CR DG CHEST 2V
2 series · 2 of 2 positions shown · non-contrast
Comparison: CT scan of the chest 02/10/2014; prior chest x-ray [phone]
[phone]

CLINICAL DATA: History of breast cancer, right upper chest pain and
shoulder pain

EXAM:
CHEST  2 VIEW

[w chest pa]
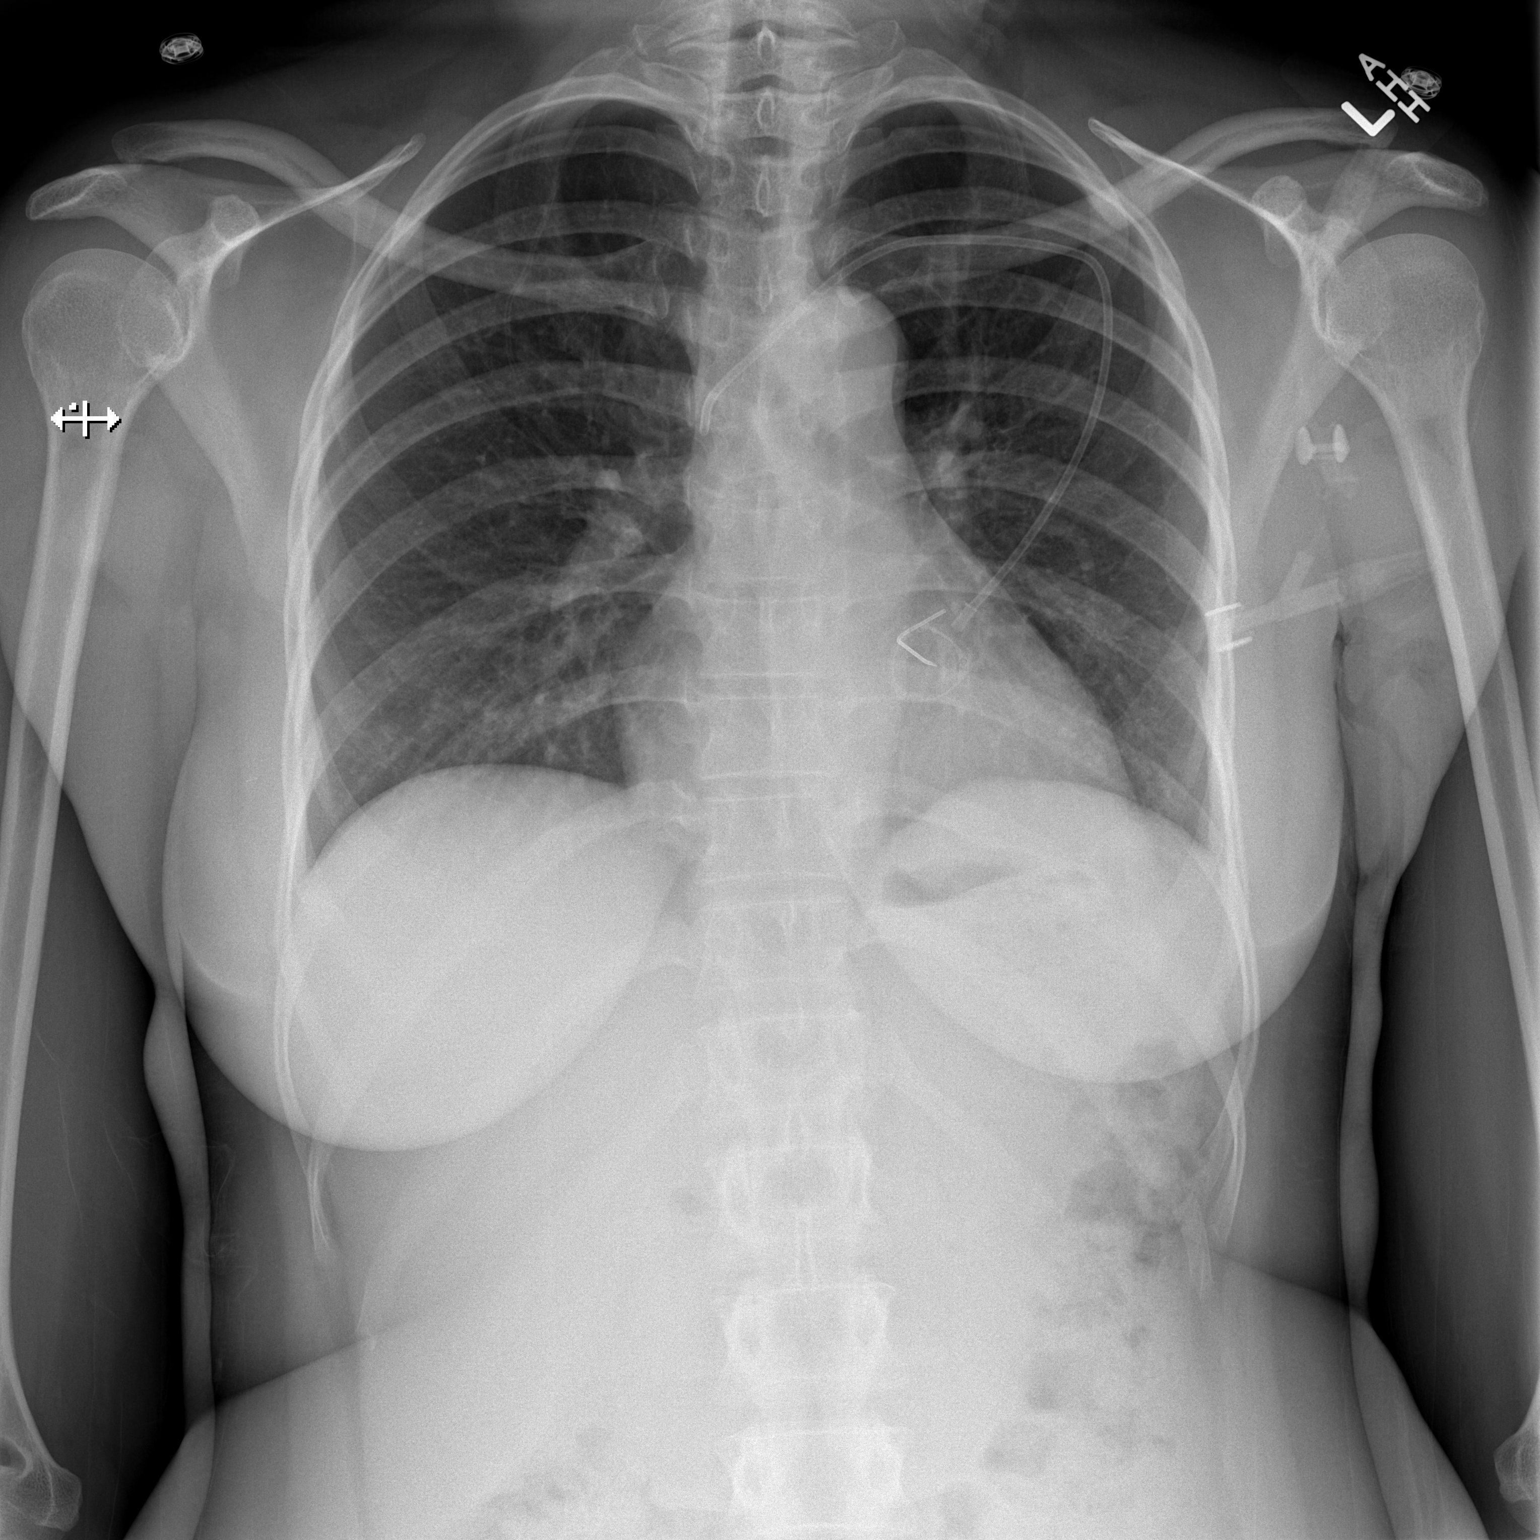

[w chest lat]
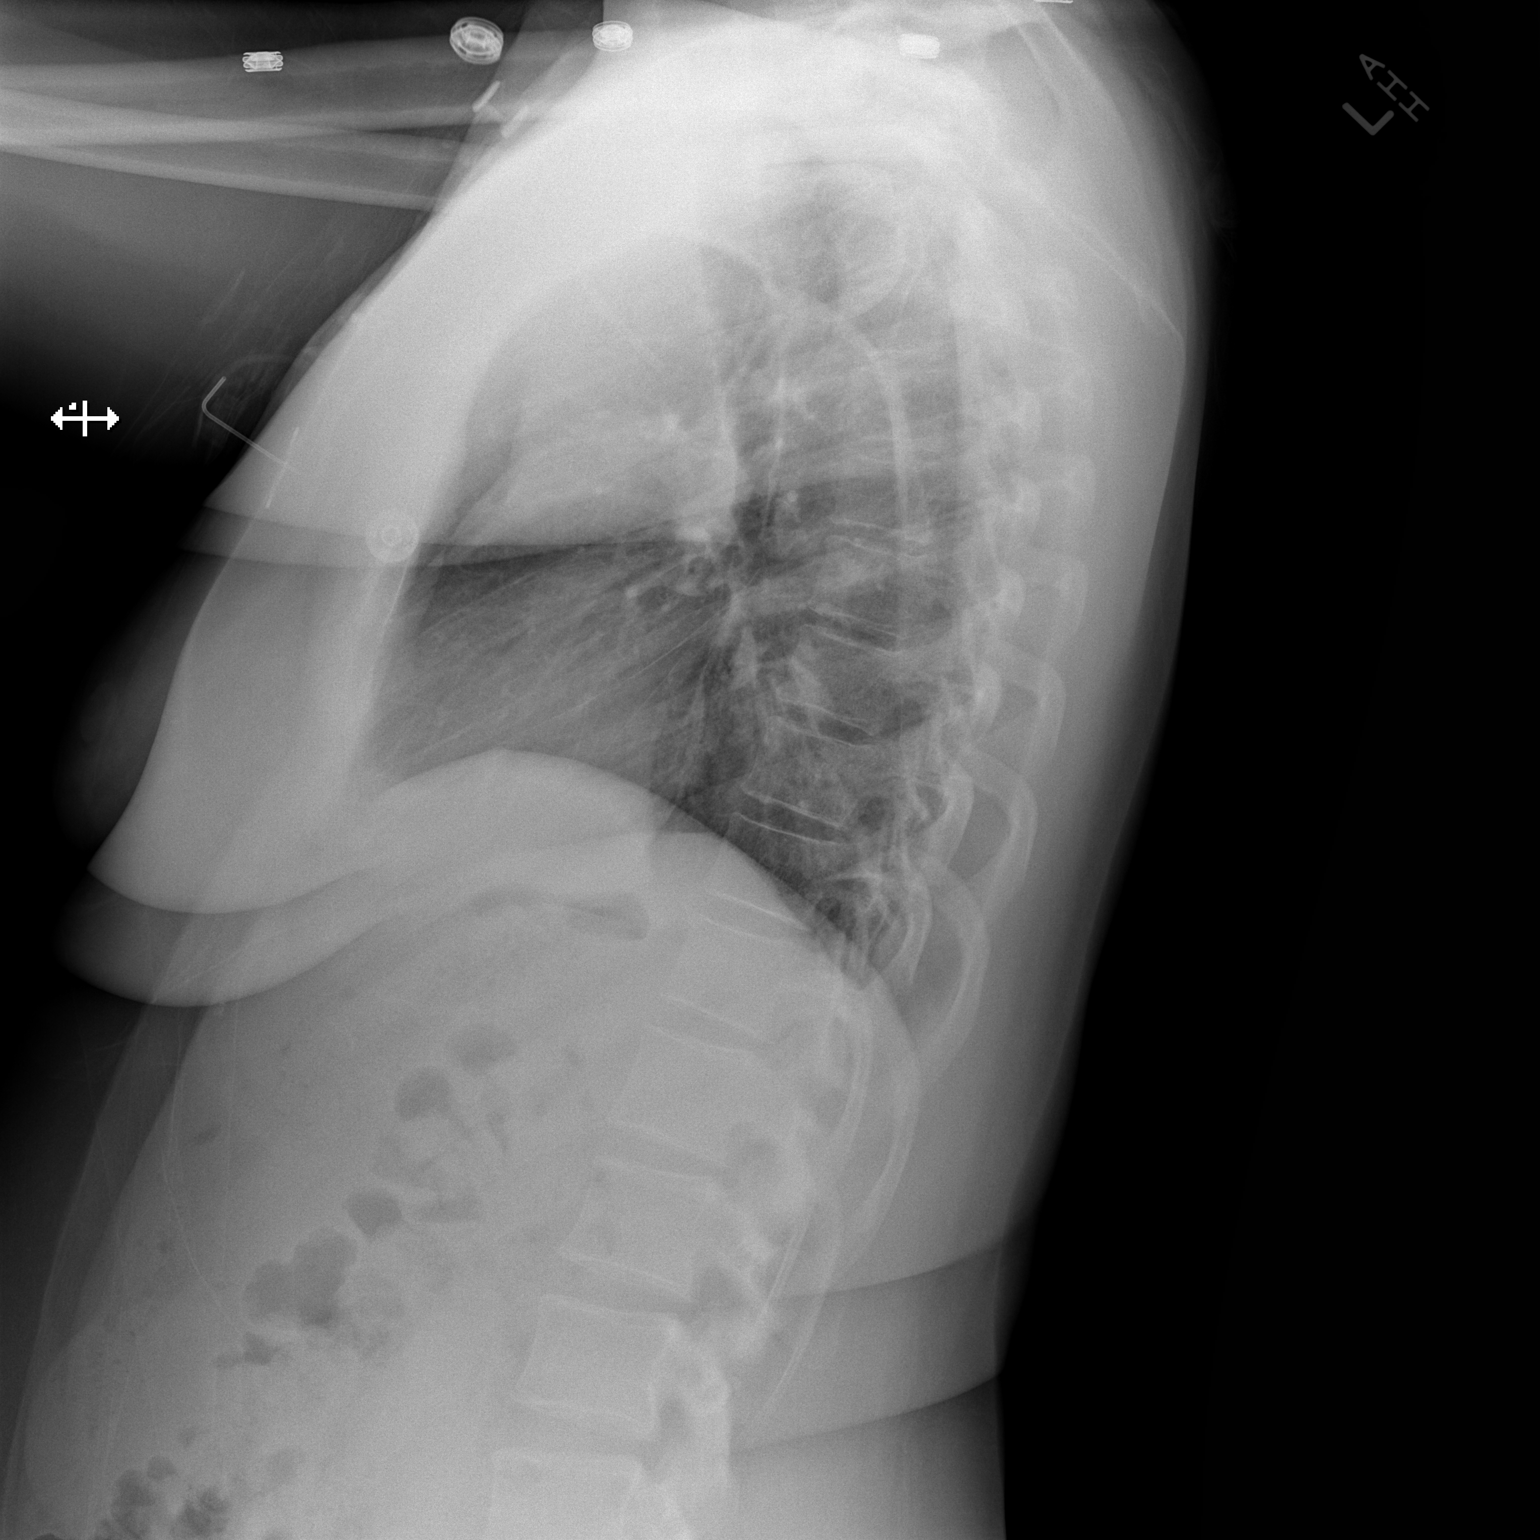

[2 of 2 positions shown; findings below may reference images not displayed]

FINDINGS: Left subclavian approach single-lumen port catheter. There has been
interval change in a the apparent to the catheter tip. The catheter
tip may have migrated into the azygos vein. Cardiac and mediastinal
contours are within normal limits. The lungs are clear. Visualized
bowel gas pattern is unremarkable. No acute osseous abnormality or
lesion.
IMPRESSION: 1. No active cardiopulmonary disease.
2. Interval change in configuration of the tip of the left
subclavian approach single-lumen port a catheter. The catheter tip
may have migrated into the azygos vein. This is likely not
clinically significant would not account for the patient's clinical
symptoms.

## 2016-04-14 ENCOUNTER — Telehealth: Payer: Self-pay | Admitting: *Deleted

## 2016-04-14 ENCOUNTER — Telehealth: Payer: Self-pay | Admitting: Oncology

## 2016-04-14 ENCOUNTER — Other Ambulatory Visit: Payer: Self-pay | Admitting: Oncology

## 2016-04-14 NOTE — Progress Notes (Unsigned)
I was called today by Dr. Reece Levy from [facility] in [place]. Rhonda [name] has been there and received a dose of TDM 1 in June. Unfortunately she did not have a good response. She has increasing problems with shortness of breath and cough because of bilateral effusions some pleural involvement with her tumor. Her liver function tests are very elevated and they do not feel that can get any more TDM 1. They suggest Taxol weekly with pertuzumab and trastuzumab. The patient wishes to receive those treatments closer to home so she will be flying back this weekend and I am making her an appointment to see me next week.  The patient's pain is apparently well controlled at present. However she appears to be deteriorating quickly. They have offered her hospice but her husband has refused.  We will discuss all this again when she returns to see me next week.

## 2016-04-14 NOTE — Telephone Encounter (Signed)
Pt did not answer, mailbox full, mailed apt for [facility]

## 2016-04-22 ENCOUNTER — Encounter: Payer: Self-pay | Admitting: Oncology

## 2016-04-22 ENCOUNTER — Ambulatory Visit: Payer: Self-pay | Admitting: Oncology

## 2016-04-22 ENCOUNTER — Other Ambulatory Visit: Payer: Self-pay

## 2016-05-11 ENCOUNTER — Other Ambulatory Visit: Payer: Self-pay | Admitting: *Deleted

## 2016-05-12 ENCOUNTER — Emergency Department (HOSPITAL_COMMUNITY)
Admission: EM | Admit: 2016-05-12 | Discharge: 2016-05-12 | Disposition: A | Discharge status: Home / Self Care | Payer: Self-pay | Attending: Emergency Medicine | Admitting: Emergency Medicine

## 2016-05-12 ENCOUNTER — Emergency Department (HOSPITAL_COMMUNITY): Payer: Self-pay

## 2016-05-12 ENCOUNTER — Other Ambulatory Visit: Payer: Self-pay

## 2016-05-12 DIAGNOSIS — C7981 Secondary malignant neoplasm of breast: Secondary | ICD-10-CM | POA: Insufficient documentation

## 2016-05-12 DIAGNOSIS — J189 Pneumonia, unspecified organism: Secondary | ICD-10-CM | POA: Insufficient documentation

## 2016-05-12 DIAGNOSIS — I10 Essential (primary) hypertension: Secondary | ICD-10-CM | POA: Insufficient documentation

## 2016-05-12 DIAGNOSIS — C799 Secondary malignant neoplasm of unspecified site: Secondary | ICD-10-CM

## 2016-05-12 DIAGNOSIS — R05 Cough: Secondary | ICD-10-CM

## 2016-05-12 DIAGNOSIS — R059 Cough, unspecified: Secondary | ICD-10-CM

## 2016-05-12 LAB — BASIC METABOLIC PANEL
Anion gap: 7 (ref 5–15)
CALCIUM: 7.9 mg/dL — AB (ref 8.9–10.3)
CO2: 26 mmol/L (ref 22–32)
CREATININE: 0.44 mg/dL (ref 0.44–1.00)
Chloride: 110 mmol/L (ref 101–111)
GFR calc Af Amer: >60 mL/min (ref >60)
GLUCOSE: 86 mg/dL (ref 65–99)
POTASSIUM: 3.5 mmol/L (ref 3.5–5.1)
Sodium: 143 mmol/L (ref 135–145)

## 2016-05-12 LAB — CBC
HCT: 32.6 % — ABNORMAL LOW (ref 36.0–46.0)
Hemoglobin: 10.8 g/dL — ABNORMAL LOW (ref 12.0–15.0)
MCH: 21.3 pg — AB (ref 26.0–34.0)
MCHC: 33.1 g/dL (ref 30.0–36.0)
MCV: 64.4 fL — ABNORMAL LOW (ref 78.0–100.0)
PLATELETS: 206 10*3/uL (ref 150–400)
RBC: 5.06 MIL/uL (ref 3.87–5.11)
RDW: 21.2 % — AB (ref 11.5–15.5)
WBC: 13.2 10*3/uL — ABNORMAL HIGH (ref 4.0–10.5)

## 2016-05-12 MED ORDER — LEVOFLOXACIN 500 MG PO TABS
500.0000 mg | ORAL_TABLET | Freq: Once | ORAL | Status: DC
Start: 2016-05-12 — End: 2016-05-12

## 2016-05-12 MED ORDER — IPRATROPIUM BROMIDE 0.02 % IN SOLN
0.5000 mg | Freq: Once | RESPIRATORY_TRACT | Status: AC
  Administered 2016-05-12: 0.5 mg via RESPIRATORY_TRACT
  Filled 2016-05-12: qty 2.5

## 2016-05-12 MED ORDER — BENZONATATE 100 MG PO CAPS: 100.0000 mg | ORAL_CAPSULE | Freq: Three times a day (TID) | ORAL | 0 refills | Status: DC

## 2016-05-12 MED ORDER — HYDROCODONE-HOMATROPINE 5-1.5 MG/5ML PO SYRP: 5.0000 mL | ORAL_SOLUTION | Freq: Four times a day (QID) | ORAL | 0 refills | Status: DC | PRN

## 2016-05-12 MED ORDER — ALBUTEROL SULFATE (2.5 MG/3ML) 0.083% IN NEBU
5.0000 mg | INHALATION_SOLUTION | Freq: Once | RESPIRATORY_TRACT | Status: AC
  Administered 2016-05-12: 5 mg via RESPIRATORY_TRACT
  Filled 2016-05-12: qty 6

## 2016-05-12 MED ORDER — CHLORHEXIDINE GLUCONATE 0.12 % MT SOLN: 15.0000 mL | Freq: Two times a day (BID) | OROMUCOSAL | 0 refills | Status: DC

## 2016-05-12 NOTE — ED Triage Notes (Signed)
Pt with hx of breast cancer c/o ongoing constant non-productive cough x several months, worsened today, sore throat x 1 week. Last chemotherapy received 04/29/16, no radiation. Lungs clear to auscultation. No SOB.

## 2016-05-12 NOTE — ED Provider Notes (Signed)
[facility] DEPT Provider Note   CSN: [id number] Arrival date & time: 05/12/16  1030     History   Chief Complaint Chief Complaint  Patient presents with  . Cough  . Sore Throat    HPI Rhonda [name] is a 45 y.o. female. Patient has a history of advanced metastatic breast cancer currently not receiving any therapy given the advanced stage of this cancer.  She is brought to the emergency department and is here with her sister.  She reports ongoing cough which has been nonproductive and present for several months.  She also reports ongoing sore throat.  For the last oncology note in May and then again in June the patient not made any decisions regarding a last ditch effort with what sounds like more of a palliative chemotherapy.  Patient reports "it's in God's hands".  She denies fevers and chills.  Her appetite has been poor.  Sister who is present with the patient is interested in referral to palliative care.   The history is provided by the patient and medical records.    Past Medical History:  Diagnosis Date  . Anemia   . Anxiety   . Blood transfusion without reported diagnosis    3 previous   . Cancer ([place]) 10/2013   right breast cancer  . GERD (gastroesophageal reflux disease)    a long time ago  . Headache(784.0)   . Hypertension   . Ulcer    hx of stomach ulcer a long time ago    Patient Active Problem List   Diagnosis Date Noted  . Neck pain 08/05/2014  . Oral pain 03/11/2014  . Nausea alone 02/18/2014  . GERD (gastroesophageal reflux disease) 02/05/2014  . Breast cancer of upper-outer quadrant of right female breast ([place]) 01/29/2014    Past Surgical History:  Procedure Laterality Date  . APPENDECTOMY     a long time ago in [place]  . BACK SURGERY  2000  . CESAREAN SECTION     2 previous  . PORT-A-CATH REMOVAL Left 03/31/2014   Procedure: REMOVAL PORT-A-CATH;  Surgeon: Edward Jolly, MD;  Location: [place];  Service: General;   Laterality: Left;  . PORT-A-CATH REMOVAL N/A 08/16/2014   Procedure: REMOVAL PORT-A-CATH;  Surgeon: Excell Seltzer, MD;  Location: [facility];  Service: General;  Laterality: N/A;  . PORTACATH PLACEMENT N/A 02/06/2014   Procedure: INSERTION PORT-A-CATH;  Surgeon: Edward Jolly, MD;  Location: [facility];  Service: General;  Laterality: N/A;  . PORTACATH PLACEMENT Right 04/14/2014   Procedure: PLACEMENT OF PORT-A-CATH;  Surgeon: Edward Jolly, MD;  Location: [place];  Service: General;  Laterality: Right;    OB History    Gravida Para Term Preterm AB Living   5 3 3   2 5    SAB TAB Ectopic Multiple Live Births   2               Home Medications    Prior to Admission medications   Medication Sig Start Date End Date Taking? Authorizing Provider  oxyCODONE (OXYCONTIN) 15 mg 12 hr tablet Take 15 mg by mouth every 12 (twelve) hours.   Yes Historical Provider, MD  oxyCODONE (OXYCONTIN) 30 MG 12 hr tablet Take 30 mg by mouth every 12 (twelve) hours.   Yes Historical Provider, MD  Oxycodone HCl 10 MG TABS Take 10 mg by mouth every 2 (two) hours as needed (cancer-associated pain).   Yes Historical Provider, MD  benzonatate Lavella Lemons)  100 MG capsule Take 1 capsule (100 mg total) by mouth every 8 (eight) hours. Patient not taking: Reported on 05/12/2016 02/05/16   Laurie Panda, NP  HYDROcodone-homatropine [facility]) 5-1.5 MG/5ML syrup Take 5 mLs by mouth every 6 (six) hours as needed for cough. Patient not taking: Reported on 02/05/2016 01/25/16   Blanchie Dessert, MD  oxyCODONE-acetaminophen (PERCOCET/ROXICET) 5-325 MG tablet Take 1-2 tablets by mouth every 4 (four) hours as needed. Patient not taking: Reported on 05/12/2016 02/11/16   Laurie Panda, NP    Family History Family History  Problem Relation Age of Onset  . Hypertension Mother   . Hypertension Father     Social History Social History  Substance Use Topics  . Smoking status: Never Smoker  . Smokeless  tobacco: Never Used  . Alcohol use No     Allergies   Review of patient's allergies indicates no known allergies.   Review of Systems Review of Systems  All other systems reviewed and are negative.    Physical Exam Updated Vital Signs BP 127/91 (BP Location: Left Arm)   Pulse 114   Temp 98.9 F (37.2 C) (Oral)   Resp 22   SpO2 93%   Physical Exam  Constitutional: She is oriented to person, place, and time.  Mildly cachectic  HENT:  Head: Normocephalic.  Eyes: EOM are normal.  Neck: Normal range of motion.  Pulmonary/Chest: Effort normal. No respiratory distress. She has wheezes.  Abdominal: She exhibits no distension.  Musculoskeletal: Normal range of motion.  Neurological: She is alert and oriented to person, place, and time.  Psychiatric: She has a normal mood and affect.  Nursing note and vitals reviewed.    ED Treatments / Results  Labs (all labs ordered are listed, but only abnormal results are displayed) Labs Reviewed  CBC - Abnormal; Notable for the following:       Result Value   WBC 13.2 (*)    Hemoglobin 10.8 (*)    HCT 32.6 (*)    MCV 64.4 (*)    MCH 21.3 (*)    RDW 21.2 (*)    All other components within normal limits  BASIC METABOLIC PANEL - Abnormal; Notable for the following:    BUN <5 (*)    Calcium 7.9 (*)    All other components within normal limits    EKG  EKG Interpretation None       Radiology Dg Chest 2 View  Result Date: 05/12/2016 CLINICAL DATA:  History of breast cancer, ongoing constant nonproductive cough for several months with recent worsening. Sore throat for 1 week. Last chemotherapy on 04/29/2016. EXAM: CHEST  2 VIEW COMPARISON:  Chest x-ray dated 12/31/2015 and chest CT dated 12/31/2015. FINDINGS: Study is hypoinspiratory. There are numerous pulmonary nodules bilaterally, with probable interposed areas of interstitial thickening/edema. Slightly more confluent opacities are seen within the perihilar regions  bilaterally which could represent pneumonia or edema. Suspect atelectasis versus pneumonia at the right lung base. Osseous structures about the chest are unremarkable. IMPRESSION: 1. Numerous pulmonary nodules bilaterally, compatible with the numerous pulmonary metastases seen on earlier chest CT of 12/31/2015. Suspect interposed areas of interstitial thickening/edema bilaterally. 2. More confluent opacities within the perihilar regions bilaterally which could represent pneumonia or edema. 3. Based on the lateral projection, suspect additional atelectasis versus pneumonia within the right middle lobe. Electronically Signed   By: Franki Cabot M.D.   On: 05/12/2016 13:34    Procedures Procedures (including critical care time)  Medications Ordered  in ED Medications - No data to display   Initial Impression / Assessment and Plan / ED Course  I have reviewed the triage vital signs and the nursing notes.  Pertinent labs & imaging results that were available during my care of the patient were reviewed by me and considered in my medical decision making (see chart for details).  Clinical Course    Some improvement with her cough with bronchodilators.  There does appear to be worsening pneumonia on x-ray.  At this time the patient and the patient's sister elect comfort measures and referral to palliative care and no antibiotics.  Patient be given Hycotuss for cough as well as Tessalon Perles.  Patient has bronchodilators at home.  I recommended that they contact her oncologist for a formal referral to palliative care [facility] for in-home palliative care resources and care.  Final Clinical Impressions(s) / ED Diagnoses   Final diagnoses:  Metastatic cancer ([place])  Recurrent pneumonia  Cough    New Prescriptions New Prescriptions   No medications on file     Jola Schmidt, MD 05/12/16 1450

## 2016-05-12 NOTE — ED Notes (Signed)
Pt given a warm blanket 

## 2016-05-12 NOTE — ED Notes (Addendum)
Pt discharged prior to shift change. RN from previous shift reported that pt was ready to be discharged pending Hospice speaking with pt after she finished using the restroom. Hospice spoke with pt right after coming out of bathroom and pt immediately left the ER as previous RN had stated she was ready to go. No vitals were obtained during discharge from previous RN. Also, no pain assessment in chart on pt. Pain of 0 charted for pt upon discharge as there is no other assessment to base pain level off of in chart.

## 2016-05-12 NOTE — Progress Notes (Signed)
ED CM contacted Stacie of [facility] and Palliative to assist with referral for pt after speaking with [place]

## 2016-05-13 ENCOUNTER — Telehealth: Payer: Self-pay

## 2016-05-13 ENCOUNTER — Telehealth: Payer: Self-pay | Admitting: *Deleted

## 2016-05-13 MED ORDER — OXYCODONE HCL ER 15 MG PO T12A: 15.0000 mg | EXTENDED_RELEASE_TABLET | Freq: Two times a day (BID) | ORAL | 0 refills | Status: DC

## 2016-05-13 NOTE — Telephone Encounter (Signed)
Call received from [place] with Hospice requesting Dr. Jana Hakim to be the attending provider for this patient.  Verbal order received and read back from Dr. Jana Hakim for Hospice to be the attending.  Order given to [employer] at this time.

## 2016-05-13 NOTE — Telephone Encounter (Signed)
Rhonda [name] from triage called stating [name] from hospice asked for oxycontin refill. She has 4 left for the weekend. S/w Dr Jana Hakim after attempting to speak with [name] for clarification. #20 oxycontin 15 filled. Will ask for Medical Director consult from hospice for pain control.  S/w Suezanne Jacquet, brother-in-law, that Dr Jana Hakim stated Rhonda [name] could double up on the oxycontin 15 mg if she needs to. And the hospice doctor will be called in for medication management.

## 2016-05-15 NOTE — Progress Notes (Signed)
[place]  [phone] [phone]   ID: Rhonda [name] OB: May 18, 1971  MR#: [id number]  BOF#:[id number]  PCP: Rhonda Coop, PA-C GYN:  Rhonda Bellman, MD SU: Rhonda Seltzer, MD OTHER MD: Rhonda Koh, MD  CHIEF COMPLAINT: Right breast cancer  CURRENT TREATMENT: Comfort/palliative care  BREAST CANCER HISTORY: From the original intake note:  Rhonda [name] was visiting relatives in [place] about 4 months ago when she noted pain in her right breast. In 2003 she had a right breast infection which felt pretty much the same. She was treated with antibiotics back then (while living in [place]). Accordingly, this time she minimized the symptoms. It felt like milk was coming down she says. She also had breast pain while having her period. Then in February 2015, she actually found 2 lumps in her right breast breast which she initially ignored. Eventually as the symptoms seemed to progress she saw a Dr. In [place] and he told her she likely had breast cancer. She waited until coming [place] the states to seek definitive care.  On 12/17/2013 she had bilateral diagnostic mammography in [facility]). In the upper outer right breast there were pleomorphic calcifications measuring approximately 9 cm in diameter. There were 4 distinct microlobulated masses in the right breast, 3 of them in the upper outer quadrant one in the lower outer quadrant. Each measures approximately 1.2 cm. Ultrasound showed a complex cystic mass in the superior central right breast, a 4 mm hypoechoic lesion, 3 suspicious solid masses elsewhere in the right breast, all of them hypoechoic and oval are round with increased vascularity. The largest of these masses measure 1.7 cm. Findings in the left breast were not suspicious or specific. The patient was referred to the breast Center where on 01/20/2014 she underwent biopsy of 2 of the right breast masses, and a right axillary lymph node. All  were positive for invasive ductal carcinoma grade 2, estrogen and progesterone receptor negative, with an MIB-1 of 85%, and HER-2 amplified, the signals ratio being 3.81 and the number per cell 8.95.  On 01/28/2014 the patient underwent bilateral breast MRI. This showed, in the right breast, multiple irregular enhancing masses in the setting of clumped non-masslike enhancement the area in question measured up to 9.4 cm. There was diffuse asymmetric skin thickening and edema involving the right breast, but no obvious nipple or pectoralis involvement. There were several enlarged and morphologically abnormal right axillary lymph node as well as inter-pectoral and subpectoral adenopathy there the largest right axillary lymph node measured 2.7 cm. There was no internal mammary lymphadenopathy noted. The left breast was unremarkable.  Her subsequent history is as detailed below.  INTERVAL HISTORY: Rhonda [name] returns today for follow up of her now metastatic breast cancer, accompanied by her sister-in-lawSara, who is visiting from [place] specifically to take care of her, and also by Rhonda [name].  Since the last visit here Rhonda [name] has missed numerous appointments with [place] and with other physicians, did get evaluated at the [facility], they suggested treatment which we offered to give her, but she did not show up for those treatments. Her cancer has progressed to the point that clearly she is not a candidate for any treatment at present other than comfort care. Hospice is involved in her care and they will be seeing her within the next 2 days.    REVIEW OF SYSTEMS: The patient has a constantly dry cough and this keeps her up at night. Her pain and is not well-controlled  on her current medications but it is not clear whether they are giving her the medication on a regular basis. The patient says she had a good bowel movement this morning but the family says she did not. There is some confusion, [name] of  sleep, constant cough, severe fatigue, visual changes, and weakness. There has been a little bit of epistaxis. There has not been any fever or bleeding. A detailed review of systems today was otherwise noncontributory  PAST MEDICAL HISTORY: Past Medical History:  Diagnosis Date  . Anemia   . Anxiety   . Blood transfusion without reported diagnosis    3 previous   . Cancer ([place]) 10/2013   right breast cancer  . GERD (gastroesophageal reflux disease)    a long time ago  . Headache(784.0)   . Hypertension   . Ulcer    hx of stomach ulcer a long time ago    PAST SURGICAL HISTORY: Past Surgical History:  Procedure Laterality Date  . APPENDECTOMY     a long time ago in [place]  . BACK SURGERY  2000  . CESAREAN SECTION     2 previous  . PORT-A-CATH REMOVAL Left 03/31/2014   Procedure: REMOVAL PORT-A-CATH;  Surgeon: Rhonda Jolly, MD;  Location: [place];  Service: General;  Laterality: Left;  . PORT-A-CATH REMOVAL N/A 08/16/2014   Procedure: REMOVAL PORT-A-CATH;  Surgeon: Rhonda Seltzer, MD;  Location: [facility];  Service: General;  Laterality: N/A;  . PORTACATH PLACEMENT N/A 02/06/2014   Procedure: INSERTION PORT-A-CATH;  Surgeon: Rhonda Jolly, MD;  Location: [facility];  Service: General;  Laterality: N/A;  . PORTACATH PLACEMENT Right 04/14/2014   Procedure: PLACEMENT OF PORT-A-CATH;  Surgeon: Rhonda Jolly, MD;  Location: [place];  Service: General;  Laterality: Right;    FAMILY HISTORY Family History  Problem Relation Age of Onset  . Hypertension Mother   . Hypertension Father    the patient's parents are living but she is not sure of their age. She had 4 brothers and 4 sisters. 2 other brothers died in the [place] and one brother is currently in [place] but they do not know where are even if he is still alive. She has one brother in this area. There is no history of breast or ovarian cancer in the family to her  knowledge.  GYNECOLOGIC HISTORY: (Reviewed 03/18/2014) Menarche somewhere between the ages of 7 and 50. First live birth age 2. The patient is GX P3. Her periods are now irregular. She took birth control pills remotely for approximately one year with no complaints.  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 Her husband Rhonda [name] is a former Emergency planning/management officer to the [place] from [place] and is a Optometrist. Their children are Rhonda [name] (11),Rhonda [name] (8) and Rhonda [name] (4).  The youngest child is currently living with Rhonda [name]. The 2 older children are currently in [place]. The patient attends a [employer] locally    ADVANCED DIRECTIVES:She is currently under hospice care   HEALTH MAINTENANCE: (Updated 03/18/2014) Social History  Substance Use Topics  . Smoking status: Never Smoker  . Smokeless tobacco: Never Used  . Alcohol use No     Colonoscopy: Never  PAP: April 2015/Dr. Constant  Bone density: Never  Lipid panel: Not on file  No Known Allergies  Current Outpatient Prescriptions  Medication Sig Dispense Refill  . dexamethasone (DECADRON) 4 MG tablet Take 1 tablet (4 mg total)  by mouth 2 (two) times daily. 60 tablet 4  . morphine (ROXANOL) 20 MG/ML concentrated solution Take 0.5-1 mLs (10-20 mg total) by mouth every 2 (two) hours as needed for severe pain. 60 mL 0  . oxyCODONE (OXYCONTIN) 30 MG 12 hr tablet Take 30 mg by mouth every 12 (twelve) hours. 120 each 0  . Oxycodone HCl 10 MG TABS Take 1 tablet (10 mg total) by mouth every 2 (two) hours as needed (cancer-associated pain). 90 tablet 0  . promethazine-codeine (PHENERGAN WITH CODEINE) 6.25-10 MG/5ML syrup Take 10 mLs by mouth every 6 (six) hours as needed for cough. 120 mL 0   No current facility-administered medications for this visit.     OBJECTIVE: middle-aged African womanExamined in a wheelchair    Vitals:   05/16/16 1306  BP: 110/63  Pulse: 69  Resp: 17  Temp: 99 F (37.2 C)    Filed Weights    Sclerae unicteric, EOMs intact Lungs no wheezes, exam interrupted by cough Heart regular rate and rhythm Abd positive bowel sounds Neuro:  Patient has difficulty communicating, and perseverates. She is oriented at least to person and place Breasts: Deferred   LAB RESULTS:   Lab Results  Component Value Date   WBC 13.2 (H) 05/12/2016   NEUTROABS 2.1 12/31/2015   HGB 10.8 (L) 05/12/2016   HCT 32.6 (L) 05/12/2016   MCV 64.4 (L) 05/12/2016   PLT 206 05/12/2016      Chemistry      Component Value Date/Time   NA 152 (H) 05/16/2016 1250   K 4.1 05/16/2016 1250   CL 110 05/12/2016 1246   CO2 26 05/16/2016 1250   BUN 6.9 (L) 05/16/2016 1250   CREATININE 0.8 05/16/2016 1250      Component Value Date/Time   CALCIUM 8.4 05/16/2016 1250   ALKPHOS 359 (H) 05/16/2016 1250   AST 632 (HH) 05/16/2016 1250   ALT 156 (H) 05/16/2016 1250   BILITOT 3.44 (H) 05/16/2016 1250      STUDIES: Dg Chest 2 View  Result Date: 05/12/2016 CLINICAL DATA:  History of breast cancer, ongoing constant nonproductive cough for several months with recent worsening. Sore throat for 1 week. Last chemotherapy on 04/29/2016. EXAM: CHEST  2 VIEW COMPARISON:  Chest x-ray dated 12/31/2015 and chest CT dated 12/31/2015. FINDINGS: Study is hypoinspiratory. There are numerous pulmonary nodules bilaterally, with probable interposed areas of interstitial thickening/edema. Slightly more confluent opacities are seen within the perihilar regions bilaterally which could represent pneumonia or edema. Suspect atelectasis versus pneumonia at the right lung base. Osseous structures about the chest are unremarkable. IMPRESSION: 1. Numerous pulmonary nodules bilaterally, compatible with the numerous pulmonary metastases seen on earlier chest CT of 12/31/2015. Suspect interposed areas of interstitial thickening/edema bilaterally. 2. More confluent opacities within the perihilar regions bilaterally which could  represent pneumonia or edema. 3. Based on the lateral projection, suspect additional atelectasis versus pneumonia within the right middle lobe. Electronically Signed   By: Franki Cabot M.D.   On: 05/12/2016 13:34    ASSESSMENT: 45 y.o. [place] woman originally from [place]  (1)   status post right breast upper outer quadrant and right axillary lymph node biopsy 01/20/2014, both positive for a clinically T3 N1-2, clinical stage III a invasive ductal carcinoma, grade 2, estrogen and progesterone receptor negative, HER-2 positive, with an MIB-1 of 85%  (2)  treated neoadjuvantly with 4 dose dense cycles of doxorubicin/cyclophosphamide completed 03/25/2014,  followed by weekly paclitaxel/ carboplatin x12, given along with  trastuzumab/ pertuzumab every 3 weeks prior to definitive surgery.   (a) all 4 drugs given with cycle 1, carboplatin was held starting with the second cycle because of poor tolerance  (b) day 1 cycle 3 (paclitaxel only) delayed one week at patient's request (no trastuzumab or pertuzumab given on this day).  (c) stopped after cycle 3 per patient preference  (3)  trastuzumab alone given once on 08/19/14 then stopped.   (4) right modified radical mastectomy, followed by postmastectomy radiation was planned, but refused by patient  METASTATIC DISEASE: (5) CT scan of the chest/abdomen/pelvis 12/31/2015 shows bilateral lung masses, with right breast, left axillary and mediastinal involvement, but no liver or bone involvement  (a) brain MRI ordered by patient did not show  (b) echo same  (c) to start T-DM1 vs Hospice referral--patient never initiated treatment  PLAN: I spent approximately 40 minutes with Rhonda [name] and her relatives going over her situation.  She is very uncomfortable at this point, coughing constantly, is having much more difficulty eating and drinking, which includes taking her pain medicine, and she is not sleeping at night which keeps everyone up. I don't  know if it is going to be possible for her to be comfortable at home. It may be better for her to go to [facility] place and I made the family aware of this option today.  Today I refilled her OxyContin which will be 30 mg twice daily, and her oxycodone which will be 10 mg to 20 mg every 2 hours as needed. I also wrote her for Roxanol 10-20 mg every 2 hours in case she has difficulty swallowing pills.  I am also starting her on dexamethasone 4 mg twice daily. I am hoping this will help her cough and perhaps help her eat and drink a little bit more.  I discussed [facility] with the family. I gave her a pamphlet regarding that and suggested they initiate a [facility] referral.  In private I met with the patient's sister-in-law [place] and explained that in my view she lived only a few days, or perhaps 2 weeks at the most. The children are not prepared. Her husband apparently is aware. I suggested that she start thinking where she should be buried and what kind of service they would like to have when she dies, as this will help the reality become more immediate to them.   I gave them my beeper number in case they had difficulties they did not know how to manage. Of course they can also call hospice.    MAGRINAT,GUSTAV C 05/16/16 1:58 PM

## 2016-05-16 ENCOUNTER — Ambulatory Visit (HOSPITAL_BASED_OUTPATIENT_CLINIC_OR_DEPARTMENT_OTHER): Payer: Self-pay | Admitting: Oncology

## 2016-05-16 ENCOUNTER — Other Ambulatory Visit: Payer: Self-pay | Admitting: *Deleted

## 2016-05-16 ENCOUNTER — Other Ambulatory Visit (HOSPITAL_BASED_OUTPATIENT_CLINIC_OR_DEPARTMENT_OTHER): Payer: Self-pay

## 2016-05-16 ENCOUNTER — Telehealth: Payer: Self-pay | Admitting: *Deleted

## 2016-05-16 VITALS — BP 110/63 | HR 69 | Temp 99.0°F | Resp 17 | Ht 70.0 in

## 2016-05-16 DIAGNOSIS — R52 Pain, unspecified: Secondary | ICD-10-CM

## 2016-05-16 DIAGNOSIS — C50411 Malignant neoplasm of upper-outer quadrant of right female breast: Secondary | ICD-10-CM

## 2016-05-16 DIAGNOSIS — C781 Secondary malignant neoplasm of mediastinum: Secondary | ICD-10-CM

## 2016-05-16 DIAGNOSIS — C773 Secondary and unspecified malignant neoplasm of axilla and upper limb lymph nodes: Secondary | ICD-10-CM

## 2016-05-16 DIAGNOSIS — Z171 Estrogen receptor negative status [ER-]: Secondary | ICD-10-CM

## 2016-05-16 DIAGNOSIS — R05 Cough: Secondary | ICD-10-CM

## 2016-05-16 LAB — COMPREHENSIVE METABOLIC PANEL
ALBUMIN: 1.5 g/dL — AB (ref 3.5–5.0)
ALK PHOS: 359 U/L — AB (ref 40–150)
ALT: 156 U/L — AB (ref 0–55)
AST: 632 U/L — AB (ref 5–34)
Anion Gap: 12 mEq/L — ABNORMAL HIGH (ref 3–11)
BILIRUBIN TOTAL: 3.44 mg/dL — AB (ref 0.20–1.20)
BUN: 6.9 mg/dL — AB (ref 7.0–26.0)
CO2: 26 meq/L (ref 22–29)
Calcium: 8.4 mg/dL (ref 8.4–10.4)
Chloride: 114 mEq/L — ABNORMAL HIGH (ref 98–109)
Creatinine: 0.8 mg/dL (ref 0.6–1.1)
EGFR: >90 mL/min/{1.73_m2} (ref >90)
GLUCOSE: 52 mg/dL — AB (ref 70–140)
Potassium: 4.1 mEq/L (ref 3.5–5.1)
SODIUM: 152 meq/L — AB (ref 136–145)
TOTAL PROTEIN: 7 g/dL (ref 6.4–8.3)

## 2016-05-16 LAB — CBC WITH DIFFERENTIAL/PLATELET
BASO%: 0.2 % (ref 0.0–2.0)
Basophils Absolute: 0 10*3/uL (ref 0.0–0.1)
EOS%: 0.1 % (ref 0.0–7.0)
Eosinophils Absolute: 0 10*3/uL (ref 0.0–0.5)
HEMATOCRIT: 31.5 % — AB (ref 34.8–46.6)
HEMOGLOBIN: 10.5 g/dL — AB (ref 11.6–15.9)
LYMPH%: 36 % (ref 14.0–49.7)
MCH: 21.1 pg — ABNORMAL LOW (ref 25.1–34.0)
MCHC: 33.3 g/dL (ref 31.5–36.0)
MCV: 63.4 fL — ABNORMAL LOW (ref 79.5–101.0)
MONO#: 1.7 10*3/uL — AB (ref 0.1–0.9)
MONO%: 14.2 % — ABNORMAL HIGH (ref 0.0–14.0)
NEUT%: 49.5 % (ref 38.4–76.8)
NEUTROS ABS: 6.1 10*3/uL (ref 1.5–6.5)
Platelets: 104 10*3/uL — ABNORMAL LOW (ref 145–400)
RBC: 4.97 10*6/uL (ref 3.70–5.45)
RDW: 21.6 % — AB (ref 11.2–14.5)
WBC: 12.2 10*3/uL — AB (ref 3.9–10.3)
lymph#: 4.4 10*3/uL — ABNORMAL HIGH (ref 0.9–3.3)
nRBC: 8 % — ABNORMAL HIGH (ref 0–0)

## 2016-05-16 LAB — TECHNOLOGIST REVIEW

## 2016-05-16 MED ORDER — MORPHINE SULFATE (CONCENTRATE) 20 MG/ML PO SOLN: 10.0000 mg | ORAL | 0 refills | Status: AC | PRN

## 2016-05-16 MED ORDER — PROMETHAZINE-CODEINE 6.25-10 MG/5ML PO SYRP: 10.0000 mL | ORAL_SOLUTION | Freq: Four times a day (QID) | ORAL | 0 refills | Status: AC | PRN

## 2016-05-16 MED ORDER — OXYCODONE HCL 10 MG PO TABS: 10.0000 mg | ORAL_TABLET | ORAL | 0 refills | Status: AC | PRN

## 2016-05-16 MED ORDER — DEXAMETHASONE 4 MG PO TABS: 4.0000 mg | ORAL_TABLET | Freq: Two times a day (BID) | ORAL | 4 refills | Status: AC

## 2016-05-16 MED ORDER — OXYCODONE HCL ER 30 MG PO T12A: 30.0000 mg | EXTENDED_RELEASE_TABLET | Freq: Two times a day (BID) | ORAL | 0 refills | Status: AC

## 2016-05-16 NOTE — Telephone Encounter (Signed)
"  Hospice calling about this patient Dr. Jana Hakim will see today at 1:30.  The social worker has seen patient and she is changing.  She has been eating liquids only.  Confused and glassy eyes."  Message routed to notify provider.

## 2016-05-16 NOTE — Telephone Encounter (Signed)
Called & spoke with Abigail Butts & requested Medical Director consult for pain management per note left from previous RN.

## 2016-05-17 ENCOUNTER — Emergency Department (HOSPITAL_COMMUNITY)
Admission: EM | Admit: 2016-05-17 | Discharge: 2016-05-17 | Disposition: A | Discharge status: Home / Self Care | Payer: Self-pay | Attending: Emergency Medicine | Admitting: Emergency Medicine

## 2016-05-17 ENCOUNTER — Telehealth: Payer: Self-pay | Admitting: Oncology

## 2016-05-17 ENCOUNTER — Other Ambulatory Visit: Payer: Self-pay

## 2016-05-17 ENCOUNTER — Encounter (HOSPITAL_COMMUNITY): Payer: Self-pay | Admitting: Emergency Medicine

## 2016-05-17 ENCOUNTER — Emergency Department (HOSPITAL_COMMUNITY): Payer: Self-pay

## 2016-05-17 ENCOUNTER — Telehealth: Payer: Self-pay

## 2016-05-17 DIAGNOSIS — Z79899 Other long term (current) drug therapy: Secondary | ICD-10-CM | POA: Insufficient documentation

## 2016-05-17 DIAGNOSIS — J189 Pneumonia, unspecified organism: Secondary | ICD-10-CM

## 2016-05-17 DIAGNOSIS — C7981 Secondary malignant neoplasm of breast: Secondary | ICD-10-CM | POA: Insufficient documentation

## 2016-05-17 DIAGNOSIS — I10 Essential (primary) hypertension: Secondary | ICD-10-CM | POA: Insufficient documentation

## 2016-05-17 DIAGNOSIS — R6520 Severe sepsis without septic shock: Secondary | ICD-10-CM

## 2016-05-17 DIAGNOSIS — Z515 Encounter for palliative care: Secondary | ICD-10-CM

## 2016-05-17 DIAGNOSIS — C799 Secondary malignant neoplasm of unspecified site: Secondary | ICD-10-CM

## 2016-05-17 DIAGNOSIS — J9602 Acute respiratory failure with hypercapnia: Secondary | ICD-10-CM

## 2016-05-17 DIAGNOSIS — C50919 Malignant neoplasm of unspecified site of unspecified female breast: Secondary | ICD-10-CM

## 2016-05-17 DIAGNOSIS — A419 Sepsis, unspecified organism: Secondary | ICD-10-CM

## 2016-05-17 LAB — CBC WITH DIFFERENTIAL/PLATELET
Basophils Absolute: 0 10*3/uL (ref 0.0–0.1)
Basophils Relative: 0 %
EOS PCT: 0 %
Eosinophils Absolute: 0 10*3/uL (ref 0.0–0.7)
HEMATOCRIT: 29.4 % — AB (ref 36.0–46.0)
HEMOGLOBIN: 9.8 g/dL — AB (ref 12.0–15.0)
Lymphocytes Relative: 17 %
Lymphs Abs: 1.6 10*3/uL (ref 0.7–4.0)
MCH: 20.9 pg — ABNORMAL LOW (ref 26.0–34.0)
MCHC: 33.3 g/dL (ref 30.0–36.0)
MCV: 62.7 fL — AB (ref 78.0–100.0)
MONOS PCT: 18 %
Monocytes Absolute: 1.7 10*3/uL — ABNORMAL HIGH (ref 0.1–1.0)
NEUTROS PCT: 65 %
Neutro Abs: 5.9 10*3/uL (ref 1.7–7.7)
PLATELETS: 102 10*3/uL — AB (ref 150–400)
RBC: 4.69 MIL/uL (ref 3.87–5.11)
RDW: 21.3 % — ABNORMAL HIGH (ref 11.5–15.5)
WBC: 9.2 10*3/uL (ref 4.0–10.5)

## 2016-05-17 LAB — COMPREHENSIVE METABOLIC PANEL
ALT: 118 U/L — ABNORMAL HIGH (ref 14–54)
AST: 489 U/L — AB (ref 15–41)
Albumin: 1.6 g/dL — ABNORMAL LOW (ref 3.5–5.0)
Alkaline Phosphatase: 295 U/L — ABNORMAL HIGH (ref 38–126)
Anion gap: 9 (ref 5–15)
BUN: 12 mg/dL (ref 6–20)
CHLORIDE: 116 mmol/L — AB (ref 101–111)
CO2: 26 mmol/L (ref 22–32)
Calcium: 7.8 mg/dL — ABNORMAL LOW (ref 8.9–10.3)
Creatinine, Ser: 0.9 mg/dL (ref 0.44–1.00)
Glucose, Bld: 156 mg/dL — ABNORMAL HIGH (ref 65–99)
POTASSIUM: 4.7 mmol/L (ref 3.5–5.1)
Sodium: 151 mmol/L — ABNORMAL HIGH (ref 135–145)
Total Bilirubin: 3.3 mg/dL — ABNORMAL HIGH (ref 0.3–1.2)
Total Protein: 6.6 g/dL (ref 6.5–8.1)

## 2016-05-17 LAB — CBG MONITORING, ED
Glucose-Capillary: 42 mg/dL — CL (ref 65–99)
Glucose-Capillary: 117 mg/dL — ABNORMAL HIGH (ref 65–99)

## 2016-05-17 LAB — I-STAT CG4 LACTIC ACID, ED: LACTIC ACID, VENOUS: 7.11 mmol/L — AB (ref 0.5–1.9)

## 2016-05-17 MED ORDER — SODIUM CHLORIDE 0.9 % IV SOLN
5.0000 mg/h | INTRAVENOUS | Status: DC
  Filled 2016-05-17: qty 10

## 2016-05-17 MED ORDER — MORPHINE SULFATE (PF) 4 MG/ML IV SOLN
4.0000 mg | Freq: Once | INTRAVENOUS | Status: AC
  Administered 2016-05-17: 4 mg via INTRAVENOUS
  Filled 2016-05-17: qty 1

## 2016-05-17 MED ORDER — ATROPINE SULFATE 1 % OP SOLN
1.0000 [drp] | Freq: Four times a day (QID) | OPHTHALMIC | Status: DC | PRN
  Filled 2016-05-17: qty 2

## 2016-05-17 MED ORDER — VANCOMYCIN HCL IN DEXTROSE 1-5 GM/200ML-% IV SOLN
1000.0000 mg | Freq: Once | INTRAVENOUS | Status: DC
  Filled 2016-05-17: qty 200

## 2016-05-17 MED ORDER — LORAZEPAM 2 MG/ML PO CONC
1.0000 mg | ORAL | Status: DC | PRN
  Filled 2016-05-17: qty 1

## 2016-05-17 MED ORDER — SODIUM CHLORIDE 0.9 % IV BOLUS (SEPSIS): 1000.0000 mL | Freq: Once | INTRAVENOUS | Status: DC

## 2016-05-17 MED ORDER — SODIUM CHLORIDE 0.9 % IV SOLN: 1000.0000 mL | INTRAVENOUS | Status: DC

## 2016-05-17 MED ORDER — SODIUM CHLORIDE 0.9 % IV BOLUS (SEPSIS): 500.0000 mL | Freq: Once | INTRAVENOUS | Status: DC

## 2016-05-17 MED ORDER — SODIUM CHLORIDE 0.9 % IV BOLUS (SEPSIS)
1000.0000 mL | Freq: Once | INTRAVENOUS | Status: AC
  Administered 2016-05-17: 1000 mL via INTRAVENOUS

## 2016-05-17 MED ORDER — DEXTROSE 50 % IV SOLN
1.0000 | Freq: Once | INTRAVENOUS | Status: AC
  Administered 2016-05-17: 50 mL via INTRAVENOUS
  Filled 2016-05-17: qty 50

## 2016-05-17 MED ORDER — PIPERACILLIN-TAZOBACTAM 3.375 G IVPB 30 MIN
3.3750 g | Freq: Once | INTRAVENOUS | Status: DC
  Filled 2016-05-17: qty 50

## 2016-05-17 NOTE — Discharge Instructions (Signed)
Go to [facility]

## 2016-05-17 NOTE — Consult Note (Signed)
[place] pulmonary and critical care medicine  Date of consultation 05/17/2016  Reason for consultation respiratory failure Consulting physician: Dr. Davonna Belling  History of present illness this is a 45 year old female with advanced breast cancer who has been followed by our [facility] here as well as the [facility]. She was initially diagnosed with breast cancer it seems around 2015. Most recently she has been evaluated by the [facility] in [facility] and she made the decision to come back to [place] a few weeks ago. She is originally from the [place] and her husband does Jacquenette Shone is currently in the [place].  At this time, she is being admitted because of progressive respiratory failure and pain. By my review of the records the patient was diagnosed with pneumonia several days ago. At that time she did not take antibiotics. She was seen by her oncologist yesterday and spleen to her and her family that she appears to be actively dying and they started to make arrangements for hospice. However, today she came to the emergency room with her extended family did not feel comfortable making a decision in regards to her CODE STATUS. Pulmonary and critical care medicine has been consulted for further evaluation.  Past Medical History:  Diagnosis Date  . Anemia   . Anxiety   . Blood transfusion without reported diagnosis    3 previous   . Cancer ([place]) 10/2013   right breast cancer  . GERD (gastroesophageal reflux disease)    a long time ago  . Headache(784.0)   . Hypertension   . Ulcer    hx of stomach ulcer a long time ago     Family History  Problem Relation Age of Onset  . Hypertension Mother   . Hypertension Father      Social History   Social History  . Marital status: Married    Spouse name: N/A  . Number of children: N/A  . Years of education: N/A   Occupational History  . Not on file.   Social History Main Topics  . Smoking status: Never Smoker  .  Smokeless tobacco: Never Used  . Alcohol use No  . Drug use: No  . Sexual activity: Yes    Birth control/ protection: None   Other Topics Concern  . Not on file   Social History Narrative  . No narrative on file     No Known Allergies   @[email or web address] @ Vitals:   05/17/16 1215 05/17/16 1216 05/17/16 1300  BP: 125/82  120/81  Pulse: (!) 165    Resp: (!) 38  (!) 29  Temp: 98.5 F (36.9 C)    TempSrc: Oral    SpO2: (!) 89% 100% 97%   3 L nasal cannula  On exam: Acutely ill-appearing Emaciated female, markedly respiratory distress Increased work of breathing, accessory muscle use, crackles bilaterally Abdomen soft, nontender Cardiovascular tachycardic no JVD Dermatologic significant edema particularly in the left leg Neurologic awake, interactive, unintelligible grunting  Chest x-ray from several days ago are still reviewed showing bilateral airspace disease, chest CT from this year reviewed showing bilateral metastasis  Records from Dr. Griffith Citron were reviewed from yesterday where he explained to her that she was actively dying and she needed to make arrangements for her death.  Impression: Acute respiratory failure with hypoxemia Healthcare associated pneumonia Severe sepsis Metastatic breast cancer, end-stage  Discussion: This is an unfortunate 45 year old female who has advanced breast cancer which is incurable and has progressed despite treatment. She currently has severe  acute respiratory failure secondary to severe healthcare associated pneumonia leading to severe sepsis and multiorgan failure. She will die from this illness. Interventions with mechanical ventilation or CPR will be medically nonbeneficial as they will cause more harm than good. Recently she refused some forms of treatment and has only chosen to take pain medication.  Plan: DO NOT RESUSCITATE Full comfort measures Start morphine drip Admit to inpatient hospice unit if bed available, otherwise  admitted to this facility with full comfort measures  Family updated at bedside at length by me, extended family including 2 brothers and a brother-in-law in multiple sister-in-law's neighbor  My critical care time 5 minutes  Roselie Awkward, MD [facility] [facility] Pager: [phone] Cell: [phone] After 3pm or if no response, call [phone]

## 2016-05-17 NOTE — ED Triage Notes (Signed)
Patient presents from home via ems. C/o generalized pain, cbg 60 (patient not swallowing oral glucose in route), 140hr, 106/70, 101.8 temporal, 92% 2L  22L AC 150cc NS in route.

## 2016-05-17 NOTE — Telephone Encounter (Signed)
appt made and letter sent by mail 8/22

## 2016-05-17 NOTE — Progress Notes (Signed)
CSW spoke with patient's family at bedside to see if [facility] would be an option in their decision for patient. Family was hoping patient would be given antibiotics to assist with treatment. CSW also spoke with Becky Augusta, Liaison, [facility], as she was present in patient's room. She explained to the family the care and process of being at their facility. She stated they have a bed available for patient, however, the family would have to agree for her to go to their facility.    CSW staffed with EDP who came and spoke with the family. EDP spoke with patient's husband via phone who agreed for patient to have comfort care at this time.   Liaison spoke with the family and patient will be placed at [facility]. Nurse updated.    Genice Rouge [id number] ED CSW 05/17/2016 3:01 PM

## 2016-05-17 NOTE — ED Notes (Signed)
Bed: [id number] Expected date:  Expected time:  Means of arrival:  Comments: EMS- cancer pt, possible sepsis

## 2016-05-17 NOTE — Progress Notes (Signed)
Collie Siad from [place] and palliative care to see pt and family

## 2016-05-17 NOTE — ED Provider Notes (Addendum)
[facility] DEPT Provider Note   CSN: [id number] Arrival date & time: 05/17/16  1151     History   Chief Complaint No chief complaint on file.   HPI Rhonda [name] is a 45 y.o. female.  The history is provided by the patient and a relative.  Level V caveat due to altered mental status.  Patient has stage III cancer. Started on hospice recently. Seen in the ER 5 days ago with a new pneumonia and was made more palliative care at that time, however patient is not actually full palliative care yet. Family members state they could not make that decision. She's been doing worse at home. Decreased mental status. Decreased oral intake. Brought in. Found have CBG of 60 for EMS. Heart rate of 140 and temperature of 101.8.    Past Medical History:  Diagnosis Date  . Anemia   . Anxiety   . Blood transfusion without reported diagnosis    3 previous   . Cancer ([place]) 10/2013   right breast cancer  . GERD (gastroesophageal reflux disease)    a long time ago  . Headache(784.0)   . Hypertension   . Ulcer    hx of stomach ulcer a long time ago    Patient Active Problem List   Diagnosis Date Noted  . Neck pain 08/05/2014  . Oral pain 03/11/2014  . Nausea alone 02/18/2014  . GERD (gastroesophageal reflux disease) 02/05/2014  . Breast cancer of upper-outer quadrant of right female breast ([place]) 01/29/2014    Past Surgical History:  Procedure Laterality Date  . APPENDECTOMY     a long time ago in [place]  . BACK SURGERY  2000  . CESAREAN SECTION     2 previous  . PORT-A-CATH REMOVAL Left 03/31/2014   Procedure: REMOVAL PORT-A-CATH;  Surgeon: Edward Jolly, MD;  Location: [facility];  Service: General;  Laterality: Left;  . PORT-A-CATH REMOVAL N/A 08/16/2014   Procedure: REMOVAL PORT-A-CATH;  Surgeon: Excell Seltzer, MD;  Location: [facility];  Service: General;  Laterality: N/A;  . PORTACATH PLACEMENT N/A 02/06/2014   Procedure: INSERTION PORT-A-CATH;   Surgeon: Edward Jolly, MD;  Location: [facility];  Service: General;  Laterality: N/A;  . PORTACATH PLACEMENT Right 04/14/2014   Procedure: PLACEMENT OF PORT-A-CATH;  Surgeon: Edward Jolly, MD;  Location: [facility];  Service: General;  Laterality: Right;    OB History    Gravida Para Term Preterm AB Living   5 3 3   2 5    SAB TAB Ectopic Multiple Live Births   2               Home Medications    Prior to Admission medications   Medication Sig Start Date End Date Taking? Authorizing Provider  benzonatate (TESSALON) 100 MG capsule Take 100 mg by mouth every 8 (eight) hours as needed for cough.   Yes Historical Provider, MD  dexamethasone (DECADRON) 4 MG tablet Take 1 tablet (4 mg total) by mouth 2 (two) times daily. 05/16/16  Yes Chauncey Cruel, MD  morphine (ROXANOL) 20 MG/ML concentrated solution Take 0.5-1 mLs (10-20 mg total) by mouth every 2 (two) hours as needed for severe pain. 05/16/16  Yes Chauncey Cruel, MD  oxyCODONE (OXYCONTIN) 30 MG 12 hr tablet Take 30 mg by mouth every 12 (twelve) hours. 05/16/16  Yes Chauncey Cruel, MD  Oxycodone HCl 10 MG TABS Take 1 tablet (10 mg total) by mouth every 2 (  two) hours as needed (cancer-associated pain). Patient taking differently: Take 10 mg by mouth every 3 (three) hours as needed (cancer-associated pain).  05/16/16  Yes Chauncey Cruel, MD  promethazine-codeine [facility] WITH CODEINE) 6.25-10 MG/5ML syrup Take 10 mLs by mouth every 6 (six) hours as needed for cough. 05/16/16  Yes Chauncey Cruel, MD    Family History Family History  Problem Relation Age of Onset  . Hypertension Mother   . Hypertension Father     Social History Social History  Substance Use Topics  . Smoking status: Never Smoker  . Smokeless tobacco: Never Used  . Alcohol use No     Allergies   Review of patient's allergies indicates no known allergies.   Review of Systems Review of Systems  Unable to perform ROS: Mental  status change     Physical Exam Updated Vital Signs BP 120/81   Pulse (!) 165   Temp 98.5 F (36.9 C) (Oral)   Resp (!) 29   SpO2 97%   Physical Exam  Constitutional:  Patient is cachectic  HENT:  Head: Atraumatic.  Neck: Neck supple.  Cardiovascular:  Tachycardia  Pulmonary/Chest:  Tachypnea with somewhat harsh breath sounds. Wound to right upper chest wall.  Abdominal: There is no tenderness.  Musculoskeletal: She exhibits no edema.  Neurological:  Patient does not speak English but is reportedly only speaking some gibberish in her native language.  Skin: Skin is warm.     ED Treatments / Results  Labs (all labs ordered are listed, but only abnormal results are displayed) Labs Reviewed  COMPREHENSIVE METABOLIC PANEL - Abnormal; Notable for the following:       Result Value   Sodium 151 (*)    Chloride 116 (*)    Glucose, Bld 156 (*)    Calcium 7.8 (*)    Albumin 1.6 (*)    AST 489 (*)    ALT 118 (*)    Alkaline Phosphatase 295 (*)    Total Bilirubin 3.3 (*)    All other components within normal limits  CBC WITH DIFFERENTIAL/PLATELET - Abnormal; Notable for the following:    Hemoglobin 9.8 (*)    HCT 29.4 (*)    MCV 62.7 (*)    MCH 20.9 (*)    RDW 21.3 (*)    Platelets 102 (*)    Monocytes Absolute 1.7 (*)    All other components within normal limits  CBG MONITORING, ED - Abnormal; Notable for the following:    Glucose-Capillary 42 (*)    All other components within normal limits  I-STAT CG4 LACTIC ACID, ED - Abnormal; Notable for the following:    Lactic Acid, Venous 7.11 (*)    All other components within normal limits  CBG MONITORING, ED - Abnormal; Notable for the following:    Glucose-Capillary 117 (*)    All other components within normal limits  CULTURE, BLOOD (ROUTINE X 2)  CULTURE, BLOOD (ROUTINE X 2)  URINE CULTURE  URINALYSIS, ROUTINE W REFLEX MICROSCOPIC (NOT AT [facility])    EKG  EKG Interpretation None       Radiology No results  found.  Procedures Procedures (including critical care time)  Medications Ordered in ED Medications  0.9 %  sodium chloride infusion (not administered)  sodium chloride 0.9 % bolus 1,000 mL (not administered)    And  sodium chloride 0.9 % bolus 1,000 mL (1,000 mLs Intravenous New Bag/Given 05/17/16 1225)    And  sodium chloride 0.9 % bolus 500  mL (not administered)  morphine 250 mg in sodium chloride 0.9 % 250 mL (1 mg/mL) infusion (not administered)  LORazepam (ATIVAN) 2 MG/ML concentrated solution 1-2 mg (not administered)  atropine 1 % ophthalmic solution 1-2 drop (not administered)  dextrose 50 % solution 50 mL (50 mLs Intravenous Given 05/17/16 1223)  morphine 4 MG/ML injection 4 mg (4 mg Intravenous Given 05/17/16 1353)     Initial Impression / Assessment and Plan / ED Course  I have reviewed the triage vital signs and the nursing notes.  Pertinent labs & imaging results that were available during my care of the patient were reviewed by me and considered in my medical decision making (see chart for details).  Clinical Course  CRITICAL CARE Performed by: Mackie Pai Total critical care time: 30 minutes Critical care time was exclusive of separately billable procedures and treating other patients. Critical care was necessary to treat or prevent imminent or life-threatening deterioration. Critical care was time spent personally by me on the following activities: development of treatment plan with patient and/or surrogate as well as nursing, discussions with consultants, evaluation of patient's response to treatment, examination of patient, obtaining history from patient or surrogate, ordering and performing treatments and interventions, ordering and review of laboratory studies, ordering and review of radiographic studies, pulse oximetry and re-evaluation of patient's condition.   Patient presented with altered mental status and worsening clinical status. Discussed with  critical care and Dr. Jana Hakim, the patient's oncologist. Patient's brother-in-law and sister-in-law initially stated they could not make the decision to change her CODE STATUS. Had more discussions with family members both by myself and by critical care. She is very ill likely has a very short life expectancy at this time. Decision was made with family members, critical care, and the patient to be palliative care. Will discuss with social work about possibility of placement to be can place. Code sepsis stopped due to her palliative care status.  Final Clinical Impressions(s) / ED Diagnoses   Final diagnoses:  Palliative care status  Metastatic cancer [facility])    New Prescriptions New Prescriptions   No medications on file     Davonna Belling, MD 05/17/16 1401   Discussed with the patient's husband over the phone. He understands that is severe right now and is in agreement with the plan that the patient be comfort care only. May get placement to be can place versus inpatient in the hospital.    Davonna Belling, MD 05/17/16 1453

## 2016-05-17 NOTE — Progress Notes (Signed)
Pt with [facility] ED visits x 4 and no admissions in last 6 months Noted staff notes that pt is not doing well  Chaplin in to see pt  ED RN asked about [facility] for pt  ED CM inquired about a SW consult for [facility] facility placement  Spoke with chaplin and ED SW

## 2016-05-17 NOTE — ED Notes (Signed)
Patient arrived to ED with weakness and altered LOC and transferred to Res B shortly thereafter.  Patient has metastatic breast cancer and recent dx of PNA, which was not treated per family's choice.  Patient is unable to speak and is groaning, unable to determine if it is from pain or some other issue.

## 2016-05-17 NOTE — Telephone Encounter (Signed)
Maura from hospice called stating that pt is declining rapidly. They have called for ambulance to take to hospital. 99.1 axillary temp, feet are cool, she has not spoken since yesterday and that was dilerious, 86% saturation, last BP in the night was 90/68, she bats RN away when trying to get BP. Ambulance arrived at end of call. Informed Dr Jana Hakim and Val of situation.

## 2016-05-17 NOTE — Progress Notes (Signed)
   05/17/16 1300  Clinical Encounter Type  Visited With Family  Visit Type Initial;Psychological support;Spiritual support;ED;Patient actively dying  Referral From Physician  Consult/Referral To [facility] (Comment);Grief support Special educational needs teacher)  Stress Factors  Patient Stress Factors Not reviewed  Family Stress Factors Loss;Other (Comment)   I visited with the patient and her family per referral from the medical team. I was told that the patient is not doing well and that her family were at her bedside.  I was told by the physician that the patient is of the [place] [employer].  The patient was unable to speak with me and the family were not open to speaking with Spiritual Care.   Please, contact Spiritual Care for further assistance.   [place] M.Div.

## 2016-05-17 NOTE — ED Notes (Addendum)
Gold DNR form signed by Odette Horns and sent with patient via [facility] to [facility].

## 2016-05-18 ENCOUNTER — Other Ambulatory Visit: Payer: Self-pay | Admitting: Oncology

## 2016-05-22 LAB — CULTURE, BLOOD (ROUTINE X 2): CULTURE: NO GROWTH

## 2016-05-27 ENCOUNTER — Other Ambulatory Visit: Payer: Self-pay | Admitting: Oncology

## 2016-05-27 DEATH — deceased

## 2016-06-05 ENCOUNTER — Telehealth: Payer: Self-pay | Admitting: Oncology

## 2016-06-05 NOTE — Telephone Encounter (Signed)
Per message from [facility] 05/29/16 cx all appointments patient deceased

## 2016-06-22 ENCOUNTER — Ambulatory Visit: Payer: Self-pay | Admitting: Oncology

## 2016-06-23 ENCOUNTER — Ambulatory Visit: Payer: Self-pay | Admitting: Oncology

## 2016-09-03 ENCOUNTER — Other Ambulatory Visit: Payer: Self-pay | Admitting: Nurse Practitioner
